# Patient Record
Sex: Female | Born: 1965
Health system: Southern US, Community
[De-identification: ages and names within clinical notes are randomized; demographics above are authoritative.]

## PROBLEM LIST (undated history)

## (undated) DIAGNOSIS — E119 Type 2 diabetes mellitus without complications: Secondary | ICD-10-CM

## (undated) DIAGNOSIS — I739 Peripheral vascular disease, unspecified: Secondary | ICD-10-CM

## (undated) DIAGNOSIS — E039 Hypothyroidism, unspecified: Secondary | ICD-10-CM

## (undated) HISTORY — DX: Peripheral vascular disease, unspecified: I73.9

---

## 2002-09-28 ENCOUNTER — Ambulatory Visit (HOSPITAL_COMMUNITY): Admission: AD | Admit: 2002-09-28 | Discharge: 2002-09-28 | Payer: Self-pay | Admitting: Obstetrics and Gynecology

## 2002-12-10 ENCOUNTER — Encounter: Admission: RE | Admit: 2002-12-10 | Discharge: 2003-03-10 | Payer: Self-pay | Admitting: Obstetrics & Gynecology

## 2003-01-18 ENCOUNTER — Inpatient Hospital Stay (HOSPITAL_COMMUNITY): Admission: AD | Admit: 2003-01-18 | Discharge: 2003-01-21 | Payer: Self-pay | Admitting: Obstetrics and Gynecology

## 2003-01-31 ENCOUNTER — Inpatient Hospital Stay (HOSPITAL_COMMUNITY): Admission: AD | Admit: 2003-01-31 | Discharge: 2003-02-03 | Payer: Self-pay | Admitting: Obstetrics and Gynecology

## 2003-02-08 ENCOUNTER — Encounter: Admission: RE | Admit: 2003-02-08 | Discharge: 2003-02-08 | Payer: Self-pay | Admitting: Oncology

## 2003-03-11 ENCOUNTER — Encounter: Admission: RE | Admit: 2003-03-11 | Discharge: 2003-03-11 | Payer: Self-pay | Admitting: Oncology

## 2003-03-11 ENCOUNTER — Encounter (HOSPITAL_COMMUNITY): Admission: RE | Admit: 2003-03-11 | Discharge: 2003-04-10 | Payer: Self-pay | Admitting: Oncology

## 2003-05-06 ENCOUNTER — Encounter (HOSPITAL_COMMUNITY): Admission: RE | Admit: 2003-05-06 | Discharge: 2003-06-05 | Payer: Self-pay | Admitting: Oncology

## 2003-05-06 ENCOUNTER — Encounter: Admission: RE | Admit: 2003-05-06 | Discharge: 2003-05-06 | Payer: Self-pay | Admitting: Oncology

## 2010-04-17 ENCOUNTER — Encounter: Payer: Self-pay | Admitting: Obstetrics & Gynecology

## 2010-06-09 ENCOUNTER — Other Ambulatory Visit: Payer: Self-pay | Admitting: Obstetrics & Gynecology

## 2010-06-09 DIAGNOSIS — Z139 Encounter for screening, unspecified: Secondary | ICD-10-CM

## 2010-07-10 ENCOUNTER — Ambulatory Visit (HOSPITAL_COMMUNITY)
Admission: RE | Admit: 2010-07-10 | Discharge: 2010-07-10 | Disposition: A | Discharge status: Home / Self Care | Payer: PRIVATE HEALTH INSURANCE | Source: Ambulatory Visit | Attending: Obstetrics & Gynecology | Admitting: Obstetrics & Gynecology

## 2010-07-10 ENCOUNTER — Other Ambulatory Visit (HOSPITAL_COMMUNITY)
Admission: RE | Admit: 2010-07-10 | Discharge: 2010-07-10 | Disposition: A | Discharge status: Home / Self Care | Payer: PRIVATE HEALTH INSURANCE | Source: Ambulatory Visit | Attending: Obstetrics & Gynecology | Admitting: Obstetrics & Gynecology

## 2010-07-10 ENCOUNTER — Other Ambulatory Visit: Payer: Self-pay | Admitting: Obstetrics & Gynecology

## 2010-07-10 DIAGNOSIS — Z139 Encounter for screening, unspecified: Secondary | ICD-10-CM

## 2010-07-10 DIAGNOSIS — Z1231 Encounter for screening mammogram for malignant neoplasm of breast: Secondary | ICD-10-CM | POA: Insufficient documentation

## 2010-07-10 DIAGNOSIS — Z01419 Encounter for gynecological examination (general) (routine) without abnormal findings: Secondary | ICD-10-CM | POA: Insufficient documentation

## 2012-08-06 ENCOUNTER — Other Ambulatory Visit: Payer: Self-pay | Admitting: Obstetrics & Gynecology

## 2012-08-19 ENCOUNTER — Other Ambulatory Visit: Payer: Self-pay

## 2012-09-15 ENCOUNTER — Other Ambulatory Visit: Payer: Self-pay

## 2012-10-08 ENCOUNTER — Telehealth: Payer: Self-pay | Admitting: Obstetrics & Gynecology

## 2012-10-08 MED ORDER — FREESTYLE SYSTEM KIT: 1.0000 | PACK | Status: DC | PRN

## 2012-10-08 NOTE — Telephone Encounter (Signed)
Spoke with pt. Glucometer broke this am and needs another one sent to [facility] in [place]. Thanks!!!

## 2012-10-09 NOTE — Telephone Encounter (Signed)
Freestyle monitor kit sent to [facility] in [place]. Pt aware. JSY

## 2012-10-09 NOTE — Telephone Encounter (Signed)
Message left Glucometer e-scribed to [facility], [place].

## 2012-10-13 ENCOUNTER — Other Ambulatory Visit: Payer: Self-pay

## 2012-11-08 ENCOUNTER — Other Ambulatory Visit: Payer: Self-pay | Admitting: Obstetrics & Gynecology

## 2012-11-10 ENCOUNTER — Other Ambulatory Visit: Payer: Self-pay

## 2012-12-15 ENCOUNTER — Other Ambulatory Visit: Payer: Self-pay

## 2012-12-24 ENCOUNTER — Other Ambulatory Visit: Payer: Self-pay | Admitting: Obstetrics & Gynecology

## 2013-01-12 ENCOUNTER — Other Ambulatory Visit: Payer: Self-pay

## 2013-02-17 ENCOUNTER — Other Ambulatory Visit: Payer: Self-pay

## 2013-03-16 ENCOUNTER — Other Ambulatory Visit: Payer: Self-pay

## 2013-05-11 ENCOUNTER — Other Ambulatory Visit: Payer: Self-pay

## 2013-06-10 ENCOUNTER — Other Ambulatory Visit: Payer: Self-pay | Admitting: Obstetrics & Gynecology

## 2013-07-10 ENCOUNTER — Other Ambulatory Visit: Payer: Self-pay

## 2013-07-30 ENCOUNTER — Other Ambulatory Visit: Payer: Self-pay | Admitting: Obstetrics & Gynecology

## 2014-04-16 ENCOUNTER — Other Ambulatory Visit: Payer: Self-pay | Admitting: Obstetrics & Gynecology

## 2014-04-20 ENCOUNTER — Other Ambulatory Visit: Payer: Self-pay | Admitting: Obstetrics & Gynecology

## 2014-05-18 ENCOUNTER — Other Ambulatory Visit: Payer: Self-pay | Admitting: Obstetrics & Gynecology

## 2014-12-15 ENCOUNTER — Other Ambulatory Visit (HOSPITAL_COMMUNITY): Payer: Self-pay | Admitting: Internal Medicine

## 2014-12-15 DIAGNOSIS — G589 Mononeuropathy, unspecified: Secondary | ICD-10-CM

## 2014-12-17 ENCOUNTER — Ambulatory Visit (HOSPITAL_COMMUNITY)
Admission: RE | Admit: 2014-12-17 | Discharge: 2014-12-17 | Disposition: A | Discharge status: Home / Self Care | Payer: Managed Care, Other (non HMO) | Source: Ambulatory Visit | Attending: Internal Medicine | Admitting: Internal Medicine

## 2014-12-17 DIAGNOSIS — G5791 Unspecified mononeuropathy of right lower limb: Secondary | ICD-10-CM | POA: Insufficient documentation

## 2014-12-17 DIAGNOSIS — Z87891 Personal history of nicotine dependence: Secondary | ICD-10-CM | POA: Insufficient documentation

## 2014-12-17 DIAGNOSIS — E1141 Type 2 diabetes mellitus with diabetic mononeuropathy: Secondary | ICD-10-CM | POA: Insufficient documentation

## 2014-12-17 DIAGNOSIS — G589 Mononeuropathy, unspecified: Secondary | ICD-10-CM

## 2014-12-30 ENCOUNTER — Other Ambulatory Visit: Payer: Self-pay | Admitting: *Deleted

## 2014-12-30 DIAGNOSIS — I739 Peripheral vascular disease, unspecified: Secondary | ICD-10-CM

## 2015-01-24 ENCOUNTER — Other Ambulatory Visit: Payer: Self-pay

## 2015-01-24 DIAGNOSIS — I75021 Atheroembolism of right lower extremity: Secondary | ICD-10-CM

## 2015-01-25 ENCOUNTER — Ambulatory Visit (INDEPENDENT_AMBULATORY_CARE_PROVIDER_SITE_OTHER): Payer: Managed Care, Other (non HMO) | Admitting: Vascular Surgery

## 2015-01-25 ENCOUNTER — Encounter: Payer: Self-pay | Admitting: *Deleted

## 2015-01-25 ENCOUNTER — Other Ambulatory Visit: Payer: Self-pay

## 2015-01-25 ENCOUNTER — Ambulatory Visit (INDEPENDENT_AMBULATORY_CARE_PROVIDER_SITE_OTHER)
Admission: RE | Admit: 2015-01-25 | Discharge: 2015-01-25 | Disposition: A | Discharge status: Home / Self Care | Payer: Managed Care, Other (non HMO) | Source: Ambulatory Visit | Attending: Vascular Surgery | Admitting: Vascular Surgery

## 2015-01-25 ENCOUNTER — Encounter: Payer: Self-pay | Admitting: Vascular Surgery

## 2015-01-25 VITALS — BP 134/81 | HR 81 | Wt 155.0 lb

## 2015-01-25 DIAGNOSIS — I739 Peripheral vascular disease, unspecified: Secondary | ICD-10-CM

## 2015-01-25 NOTE — Progress Notes (Signed)
Patient name: Brianna Powers MRN: [id number] DOB: 04/13/1965 Sex: female   Referred by: Charlestine Night  Reason for referral:  Chief Complaint  Patient presents with  . Re-evaluation    add on - eval blue toe syndrome     HISTORY OF PRESENT ILLNESS: Patient resents today for a discussion of her right foot. She is an otherwise healthy 49 year old female with a several month history of difficulty in her right foot. Been diagnosed with Buerger's and with rain onset dominant. Initially she reports that this would come and go but she has had consistent pain in her right foot with cyanotic and mottled changing. This is become progressively worse and now her right toe and the area of the ball of her right great toe remains blue and painful. This is worse with cold exposure. Difficult for her to walk. This is all new for her. She does not have any similar symptoms in her hands or in her left foot. She does not have any ulcerations.  No past medical history on file.  No past surgical history on file.  Social History   Social History  . Marital Status: Married    Spouse Name: N/A  . Number of Children: N/A  . Years of Education: N/A   Occupational History  . Not on file.   Social History Main Topics  . Smoking status: Former Smoker    Quit date: 05/25/2014  . Smokeless tobacco: Not on file  . Alcohol Use: No  . Drug Use: No  . Sexual Activity: Not on file   Other Topics Concern  . Not on file   Social History Narrative  . No narrative on file    No family history on file.  Allergies as of 01/25/2015  . (No Known Allergies)    Current Outpatient Prescriptions on File Prior to Visit  Medication Sig Dispense Refill  . ACCU-CHEK AVIVA PLUS test strip TEST twice a day 50 each PRN  . ACCU-CHEK SOFTCLIX LANCETS lancets TEST twice a day 100 each PRN  . glucose monitoring kit (FREESTYLE) monitoring kit 1 each by Does not apply route as needed for other. Use as directed 1 each 0    . SYNTHROID 100 MCG tablet take 1 tablet by mouth once daily 30 tablet 1   No current facility-administered medications on file prior to visit.     REVIEW OF SYSTEMS:  Positives indicated with an "X"  CARDIOVASCULAR:  _0  chest pain   _1  chest pressure   _2  palpitations   _3  orthopnea   _4  dyspnea on exertion   _5  claudication   [x ] rest pain   _6  DVT   _7  phlebitis PULMONARY:   _8  productive cough   _9  asthma   _10  wheezing NEUROLOGIC:   _11  weakness  _12  paresthesias  _13  aphasia  _14  amaurosis  _15  dizziness HEMATOLOGIC:   _16  bleeding problems   _17  clotting disorders MUSCULOSKELETAL:  _18  joint pain   _19  joint swelling GASTROINTESTINAL: _20   blood in stool  _21   hematemesis GENITOURINARY:  _22   dysuria  _23   hematuria PSYCHIATRIC:  _24  history of major depression INTEGUMENTARY:  _25  rashes  _26  ulcers CONSTITUTIONAL:  _27  fever   _28  chills  PHYSICAL EXAMINATION:  General: The patient is a well-nourished female, in no acute distress. Vital  signs are BP 134/81 mmHg  Pulse 81  Wt 155 lb (70.308 kg)  SpO2 97% Pulmonary: There is a good air exchange bilaterally without wheezing or rales. Abdomen: Soft and non-tender with normal pitch bowel sounds. No bruits noted Musculoskeletal: There are no major deformities.  There is no significant extremity pain. Neurologic: No focal weakness or paresthesias are detected, Skin: There are no ulcer or rashes noted. Cyanosis in her right great toe and dorsum and plantar aspect of her foot Psychiatric: The patient has normal affect. Cardiovascular: There is a regular rate and rhythm without significant murmur appreciated. 1+ femoral pulses bilaterally. 1+ left dorsalis pedis pulse and absent pedal pulses on the right  [facility] Vascular Lab Studies:  Ordered and Independently Reviewed markedly dampened flow in the digits of all the toes on her right foot. She has monophasic flow in the posterior tibial and anterior tibial right.  Biphasic flow on the left. Ankle arm indexes 0.77 on the right and 0.90 on the left  Impression and Plan:  Had long discussion with patient. Unclear as to etiology of the ischemia of the toes on her right foot. This could be embolic. Doubtful that this is Raynaud's with only her right foot and no hand involvement. Have recommended arteriography for further evaluation. She wished to proceed as soon as possible be added to her scheduled for tomorrow. Will make further recommendations depending on her study. Explain that there is a very high likelihood that there will be nothing correctable regarding her foot ischemia.    Curt Jews [facility] [place] Office: [phone]

## 2015-01-26 ENCOUNTER — Encounter (HOSPITAL_COMMUNITY): Payer: Self-pay

## 2015-01-26 ENCOUNTER — Inpatient Hospital Stay (HOSPITAL_COMMUNITY)
Admission: AD | Admit: 2015-01-26 | Discharge: 2015-01-30 | DRG: 254 | Disposition: A | Discharge status: Home / Self Care | Payer: Managed Care, Other (non HMO) | Source: Ambulatory Visit | Attending: Vascular Surgery | Admitting: Vascular Surgery

## 2015-01-26 ENCOUNTER — Encounter (HOSPITAL_COMMUNITY)
Admission: AD | Disposition: A | Discharge status: Home / Self Care | Payer: Self-pay | Source: Ambulatory Visit | Attending: Vascular Surgery

## 2015-01-26 DIAGNOSIS — I70203 Unspecified atherosclerosis of native arteries of extremities, bilateral legs: Principal | ICD-10-CM | POA: Diagnosis present

## 2015-01-26 DIAGNOSIS — Z9889 Other specified postprocedural states: Secondary | ICD-10-CM

## 2015-01-26 DIAGNOSIS — E039 Hypothyroidism, unspecified: Secondary | ICD-10-CM | POA: Diagnosis present

## 2015-01-26 DIAGNOSIS — I731 Thromboangiitis obliterans [Buerger's disease]: Secondary | ICD-10-CM | POA: Diagnosis present

## 2015-01-26 DIAGNOSIS — I998 Other disorder of circulatory system: Secondary | ICD-10-CM | POA: Diagnosis present

## 2015-01-26 DIAGNOSIS — Z01811 Encounter for preprocedural respiratory examination: Secondary | ICD-10-CM

## 2015-01-26 DIAGNOSIS — R230 Cyanosis: Secondary | ICD-10-CM | POA: Diagnosis present

## 2015-01-26 DIAGNOSIS — I743 Embolism and thrombosis of arteries of the lower extremities: Secondary | ICD-10-CM | POA: Diagnosis not present

## 2015-01-26 DIAGNOSIS — E119 Type 2 diabetes mellitus without complications: Secondary | ICD-10-CM | POA: Diagnosis present

## 2015-01-26 DIAGNOSIS — I7410 Embolism and thrombosis of unspecified parts of aorta: Secondary | ICD-10-CM | POA: Diagnosis present

## 2015-01-26 HISTORY — PX: PERIPHERAL VASCULAR CATHETERIZATION: SHX172C

## 2015-01-26 HISTORY — DX: Type 2 diabetes mellitus without complications: E11.9

## 2015-01-26 HISTORY — DX: Hypothyroidism, unspecified: E03.9

## 2015-01-26 LAB — URINALYSIS, ROUTINE W REFLEX MICROSCOPIC
Bilirubin Urine: NEGATIVE
Glucose, UA: NEGATIVE mg/dL
KETONES UR: 15 mg/dL — AB
LEUKOCYTES UA: NEGATIVE
NITRITE: NEGATIVE
PH: 6 (ref 5.0–8.0)
PROTEIN: NEGATIVE mg/dL
Specific Gravity, Urine: 1.025 (ref 1.005–1.030)
Urobilinogen, UA: 0.2 mg/dL (ref 0.0–1.0)

## 2015-01-26 LAB — POCT I-STAT, CHEM 8
BUN: 16 mg/dL (ref 6–20)
CHLORIDE: 104 mmol/L (ref 101–111)
CREATININE: 0.8 mg/dL (ref 0.44–1.00)
Calcium, Ion: 1.15 mmol/L (ref 1.12–1.23)
GLUCOSE: 103 mg/dL — AB (ref 65–99)
HCT: 44 % (ref 36.0–46.0)
Hemoglobin: 15 g/dL (ref 12.0–15.0)
POTASSIUM: 3.8 mmol/L (ref 3.5–5.1)
Sodium: 139 mmol/L (ref 135–145)
TCO2: 22 mmol/L (ref 0–100)

## 2015-01-26 LAB — URINE MICROSCOPIC-ADD ON

## 2015-01-26 LAB — COMPREHENSIVE METABOLIC PANEL
ALT: 16 U/L (ref 14–54)
AST: 20 U/L (ref 15–41)
Albumin: 3.6 g/dL (ref 3.5–5.0)
Alkaline Phosphatase: 84 U/L (ref 38–126)
Anion gap: 11 (ref 5–15)
BILIRUBIN TOTAL: 0.6 mg/dL (ref 0.3–1.2)
BUN: 11 mg/dL (ref 6–20)
CO2: 27 mmol/L (ref 22–32)
Calcium: 9.5 mg/dL (ref 8.9–10.3)
Chloride: 103 mmol/L (ref 101–111)
Creatinine, Ser: 0.81 mg/dL (ref 0.44–1.00)
Glucose, Bld: 112 mg/dL — ABNORMAL HIGH (ref 65–99)
POTASSIUM: 3.9 mmol/L (ref 3.5–5.1)
Sodium: 141 mmol/L (ref 135–145)
TOTAL PROTEIN: 6.7 g/dL (ref 6.5–8.1)

## 2015-01-26 LAB — CBC
HEMATOCRIT: 39.6 % (ref 36.0–46.0)
Hemoglobin: 12.9 g/dL (ref 12.0–15.0)
MCH: 30.6 pg (ref 26.0–34.0)
MCHC: 32.6 g/dL (ref 30.0–36.0)
MCV: 93.8 fL (ref 78.0–100.0)
PLATELETS: 288 10*3/uL (ref 150–400)
RBC: 4.22 MIL/uL (ref 3.87–5.11)
RDW: 12.9 % (ref 11.5–15.5)
WBC: 11.9 10*3/uL — ABNORMAL HIGH (ref 4.0–10.5)

## 2015-01-26 LAB — GLUCOSE, CAPILLARY
Glucose-Capillary: 96 mg/dL (ref 65–99)
Glucose-Capillary: 92 mg/dL (ref 65–99)
Glucose-Capillary: 121 mg/dL — ABNORMAL HIGH (ref 65–99)

## 2015-01-26 LAB — PROTIME-INR
INR: 1.06 (ref 0.00–1.49)
PROTHROMBIN TIME: 14 s (ref 11.6–15.2)

## 2015-01-26 SURGERY — ABDOMINAL AORTOGRAM
Anesthesia: LOCAL

## 2015-01-26 MED ORDER — MIDAZOLAM HCL 2 MG/2ML IJ SOLN
INTRAMUSCULAR | Status: AC
  Filled 2015-01-26: qty 4

## 2015-01-26 MED ORDER — SODIUM CHLORIDE 0.9 % IV SOLN
INTRAVENOUS | Status: DC
  Administered 2015-01-26 – 2015-01-28 (×3): via INTRAVENOUS

## 2015-01-26 MED ORDER — ACETAMINOPHEN 325 MG RE SUPP
325.0000 mg | RECTAL | Status: DC | PRN
  Filled 2015-01-26: qty 2

## 2015-01-26 MED ORDER — BISACODYL 10 MG RE SUPP: 10.0000 mg | Freq: Every day | RECTAL | Status: DC | PRN

## 2015-01-26 MED ORDER — ACETAMINOPHEN 325 MG PO TABS: 325.0000 mg | ORAL_TABLET | ORAL | Status: DC | PRN

## 2015-01-26 MED ORDER — FENTANYL CITRATE (PF) 100 MCG/2ML IJ SOLN
INTRAMUSCULAR | Status: AC
  Filled 2015-01-26: qty 4

## 2015-01-26 MED ORDER — HYDRALAZINE HCL 20 MG/ML IJ SOLN: 5.0000 mg | INTRAMUSCULAR | Status: DC | PRN

## 2015-01-26 MED ORDER — MIDAZOLAM HCL 2 MG/2ML IJ SOLN
INTRAMUSCULAR | Status: DC | PRN
  Administered 2015-01-26: 1 mg via INTRAVENOUS

## 2015-01-26 MED ORDER — GABAPENTIN 100 MG PO CAPS: 100.0000 mg | ORAL_CAPSULE | Freq: Two times a day (BID) | ORAL | Status: DC | PRN

## 2015-01-26 MED ORDER — INSULIN ASPART 100 UNIT/ML ~~LOC~~ SOLN: 0.0000 [IU] | Freq: Three times a day (TID) | SUBCUTANEOUS | Status: DC

## 2015-01-26 MED ORDER — SODIUM CHLORIDE 0.9 % IV SOLN
INTRAVENOUS | Status: DC
  Administered 2015-01-26: 12:00:00 via INTRAVENOUS

## 2015-01-26 MED ORDER — PANTOPRAZOLE SODIUM 40 MG PO TBEC
40.0000 mg | DELAYED_RELEASE_TABLET | Freq: Every day | ORAL | Status: DC
  Administered 2015-01-29: 40 mg via ORAL
  Filled 2015-01-26 (×3): qty 1

## 2015-01-26 MED ORDER — LEVOTHYROXINE SODIUM 100 MCG PO TABS
100.0000 ug | ORAL_TABLET | Freq: Every day | ORAL | Status: DC
  Administered 2015-01-27 – 2015-01-30 (×3): 100 ug via ORAL
  Filled 2015-01-26 (×5): qty 1

## 2015-01-26 MED ORDER — GUAIFENESIN-DM 100-10 MG/5ML PO SYRP: 15.0000 mL | ORAL_SOLUTION | ORAL | Status: DC | PRN

## 2015-01-26 MED ORDER — FENTANYL CITRATE (PF) 100 MCG/2ML IJ SOLN
INTRAMUSCULAR | Status: DC | PRN
  Administered 2015-01-26: 25 ug via INTRAVENOUS

## 2015-01-26 MED ORDER — HYDROCODONE-ACETAMINOPHEN 5-325 MG PO TABS: 1.0000 | ORAL_TABLET | Freq: Four times a day (QID) | ORAL | Status: DC | PRN

## 2015-01-26 MED ORDER — METOPROLOL TARTRATE 1 MG/ML IV SOLN: 2.0000 mg | INTRAVENOUS | Status: DC | PRN

## 2015-01-26 MED ORDER — PHENOL 1.4 % MT LIQD: 1.0000 | OROMUCOSAL | Status: DC | PRN

## 2015-01-26 MED ORDER — ENOXAPARIN SODIUM 40 MG/0.4ML ~~LOC~~ SOLN
40.0000 mg | SUBCUTANEOUS | Status: DC
  Administered 2015-01-29 – 2015-01-30 (×2): 40 mg via SUBCUTANEOUS
  Filled 2015-01-26 (×3): qty 0.4

## 2015-01-26 MED ORDER — LIDOCAINE HCL (PF) 1 % IJ SOLN
INTRAMUSCULAR | Status: AC
  Filled 2015-01-26: qty 30

## 2015-01-26 MED ORDER — TRAMADOL HCL 50 MG PO TABS
50.0000 mg | ORAL_TABLET | Freq: Four times a day (QID) | ORAL | Status: DC | PRN
Start: 2015-01-26 — End: 2015-01-30
  Administered 2015-01-28 – 2015-01-30 (×4): 50 mg via ORAL
  Filled 2015-01-26 (×4): qty 1

## 2015-01-26 MED ORDER — LIDOCAINE HCL (PF) 1 % IJ SOLN
INTRAMUSCULAR | Status: DC | PRN
  Administered 2015-01-26: 15:00:00

## 2015-01-26 MED ORDER — IODIXANOL 320 MG/ML IV SOLN
INTRAVENOUS | Status: DC | PRN
  Administered 2015-01-26: 100 mL via INTRA_ARTERIAL

## 2015-01-26 MED ORDER — MAGNESIUM HYDROXIDE 400 MG/5ML PO SUSP: 30.0000 mL | Freq: Every day | ORAL | Status: DC | PRN

## 2015-01-26 MED ORDER — ONDANSETRON HCL 4 MG/2ML IJ SOLN: 4.0000 mg | Freq: Four times a day (QID) | INTRAMUSCULAR | Status: DC | PRN

## 2015-01-26 MED ORDER — MORPHINE SULFATE (PF) 4 MG/ML IV SOLN
INTRAVENOUS | Status: AC
  Filled 2015-01-26: qty 1

## 2015-01-26 MED ORDER — HEPARIN (PORCINE) IN NACL 2-0.9 UNIT/ML-% IJ SOLN
INTRAMUSCULAR | Status: AC
  Filled 2015-01-26: qty 1000

## 2015-01-26 MED ORDER — LABETALOL HCL 5 MG/ML IV SOLN: 10.0000 mg | INTRAVENOUS | Status: DC | PRN

## 2015-01-26 MED ORDER — POTASSIUM CHLORIDE CRYS ER 20 MEQ PO TBCR: 20.0000 meq | EXTENDED_RELEASE_TABLET | Freq: Once | ORAL | Status: DC

## 2015-01-26 MED ORDER — MORPHINE SULFATE (PF) 2 MG/ML IV SOLN
4.0000 mg | INTRAVENOUS | Status: DC | PRN
Start: 2015-01-26 — End: 2015-01-30
  Administered 2015-01-26: 4 mg via INTRAVENOUS
  Filled 2015-01-26: qty 2

## 2015-01-26 MED ORDER — MORPHINE SULFATE (PF) 2 MG/ML IV SOLN
2.0000 mg | INTRAVENOUS | Status: DC | PRN
  Administered 2015-01-27 (×2): 2 mg via INTRAVENOUS
  Administered 2015-01-27: 4 mg via INTRAVENOUS
  Filled 2015-01-26: qty 1
  Filled 2015-01-26: qty 2
  Filled 2015-01-26: qty 1

## 2015-01-26 MED ORDER — ALUM & MAG HYDROXIDE-SIMETH 200-200-20 MG/5ML PO SUSP: 15.0000 mL | ORAL | Status: DC | PRN

## 2015-01-26 SURGICAL SUPPLY — 12 items
CATH ANGIO 5F PIGTAIL 65CM (CATHETERS) ×2 IMPLANT
CATH SOFT-VU 4F 65 STRAIGHT (CATHETERS) ×1 IMPLANT
CATH SOFT-VU STRAIGHT 4F 65CM (CATHETERS) ×1
COVER PRB 48X5XTLSCP FOLD TPE (BAG) ×1 IMPLANT
COVER PROBE 5X48 (BAG) ×1
IV CATH ACCUCATH 20GX1.25IN KT (NEEDLE) ×4 IMPLANT
KIT PV (KITS) ×2 IMPLANT
SHEATH PINNACLE 5F 10CM (SHEATH) ×2 IMPLANT
SYR MEDRAD MARK V 150ML (SYRINGE) ×2 IMPLANT
TRANSDUCER W/STOPCOCK (MISCELLANEOUS) ×2 IMPLANT
TRAY PV CATH (CUSTOM PROCEDURE TRAY) ×2 IMPLANT
WIRE HITORQ VERSACORE ST 145CM (WIRE) ×2 IMPLANT

## 2015-01-26 NOTE — Progress Notes (Signed)
Site area: [facility] visitorfem art Site Prior to Removal:  Level 0 Pressure Applied For:20 min Manual:   manual Patient Status During Pull:  A/o Post Pull Site:  Level 0 Post Pull Instructions Given:  Pt understands post instructions Post Pull Pulses Present: doppler rt pt// doppler lt pt/dp Dressing Applied:  tegaderm and a 4x4 Bedrest begins @ 16:15:00 Comments:

## 2015-01-27 ENCOUNTER — Encounter (HOSPITAL_COMMUNITY): Payer: Self-pay | Admitting: Radiology

## 2015-01-27 ENCOUNTER — Ambulatory Visit (HOSPITAL_COMMUNITY): Payer: Managed Care, Other (non HMO)

## 2015-01-27 LAB — HEMOGLOBIN A1C
HEMOGLOBIN A1C: 6.2 % — AB (ref 4.8–5.6)
MEAN PLASMA GLUCOSE: 131 mg/dL

## 2015-01-27 LAB — GLUCOSE, CAPILLARY
GLUCOSE-CAPILLARY: 102 mg/dL — AB (ref 65–99)
GLUCOSE-CAPILLARY: 89 mg/dL (ref 65–99)
Glucose-Capillary: 129 mg/dL — ABNORMAL HIGH (ref 65–99)
Glucose-Capillary: 106 mg/dL — ABNORMAL HIGH (ref 65–99)

## 2015-01-27 LAB — CBC
HCT: 37.4 % (ref 36.0–46.0)
Hemoglobin: 12 g/dL (ref 12.0–15.0)
MCH: 30.3 pg (ref 26.0–34.0)
MCHC: 32.1 g/dL (ref 30.0–36.0)
MCV: 94.4 fL (ref 78.0–100.0)
PLATELETS: 269 10*3/uL (ref 150–400)
RBC: 3.96 MIL/uL (ref 3.87–5.11)
RDW: 12.8 % (ref 11.5–15.5)
WBC: 10.8 10*3/uL — AB (ref 4.0–10.5)

## 2015-01-27 LAB — BASIC METABOLIC PANEL
ANION GAP: 8 (ref 5–15)
BUN: 15 mg/dL (ref 6–20)
CALCIUM: 9.1 mg/dL (ref 8.9–10.3)
CO2: 23 mmol/L (ref 22–32)
Chloride: 106 mmol/L (ref 101–111)
Creatinine, Ser: 0.79 mg/dL (ref 0.44–1.00)
Glucose, Bld: 112 mg/dL — ABNORMAL HIGH (ref 65–99)
Potassium: 4.2 mmol/L (ref 3.5–5.1)
Sodium: 137 mmol/L (ref 135–145)

## 2015-01-27 MED ORDER — DEXTROSE 5 % IV SOLN
1.5000 g | INTRAVENOUS | Status: AC
  Administered 2015-01-28: 1.5 g via INTRAVENOUS
  Filled 2015-01-27: qty 1.5

## 2015-01-27 MED ORDER — IOHEXOL 350 MG/ML SOLN
100.0000 mL | Freq: Once | INTRAVENOUS | Status: AC | PRN
  Administered 2015-01-27: 100 mL via INTRAVENOUS

## 2015-01-27 NOTE — Progress Notes (Signed)
Utilization review completed. Ihor Meinzer, RN, BSN. 

## 2015-01-27 NOTE — Progress Notes (Signed)
Subjective: Interval History: none.. Still with pain in her right foot but better controlled with morphine IV.  Objective: Vital signs in last 24 hours: Temp:  [97.7 F (36.5 C)-98.6 F (37 C)] 98.6 F (37 C) (11/03 0406) Pulse Rate:  [69-101] 72 (11/03 0406) Resp:  [10-21] 16 (11/03 0406) BP: (101-139)/(53-93) 101/59 mmHg (11/03 0406) SpO2:  [97 %-100 %] 100 % (11/03 0406) Weight:  [155 lb (70.308 kg)] 155 lb (70.308 kg) (11/02 1123)  Intake/Output from previous day: 11/02 0701 - 11/03 0700 In: 240 [P.O.:240] Out: 600 [Urine:600] Intake/Output this shift:    No change in her right foot. Cyanotic right great toe and metatarsal head area. Left groin without hematoma  Lab Results:  Recent Labs  01/26/15 1931 01/27/15 0338  WBC 11.9* 10.8*  HGB 12.9 12.0  HCT 39.6 37.4  PLT 288 269   BMET  Recent Labs  01/26/15 1931 01/27/15 0338  NA 141 137  K 3.9 4.2  CL 103 106  CO2 27 23  GLUCOSE 112* 112*  BUN 11 15  CREATININE 0.81 0.79  CALCIUM 9.5 9.1    Studies/Results: Dg Chest 2 View  01/27/2015  CLINICAL DATA:  Abdominal surgery. EXAM: CHEST  2 VIEW COMPARISON:  01/23/2011. FINDINGS: Mediastinum and hilar structures normal. The lungs are clear. Heart size normal. No pleural effusion or pneumothorax. No acute bony abnormality. IMPRESSION: No acute cardiopulmonary disease. Electronically Signed   By: Maisie Fus  Register   On: 01/27/2015 07:14   Ct Angio Chest Aorta W/cm &/or Wo/cm  01/27/2015  CLINICAL DATA:  Evaluate source of embolism EXAM: CT ANGIOGRAPHY CHEST, ABDOMEN AND PELVIS TECHNIQUE: Multidetector CT imaging through the chest, abdomen and pelvis was performed using the standard protocol during bolus administration of intravenous contrast. Multiplanar reconstructed images and MIPs were obtained and reviewed to evaluate the vascular anatomy. CONTRAST:  OMNIPAQUE IOHEXOL 350 MG/ML SOLN COMPARISON:  None. FINDINGS: CTA CHEST FINDINGS The thoracic aorta is normal  in caliber. There is no dissection. There is no stenosis. There is no significant plaque. The great vessel origins are widely patent with standard anatomy. Pulmonary vasculature is well opacified and patent with no pulmonary embolism. Review of the MIP images confirms the above findings. Nonvascular chest findings: The lungs are clear. Central airways are patent. There is no effusion. There is no adenopathy. No significant musculoskeletal abnormalities are evident. CTA ABDOMEN AND PELVIS FINDINGS The upper abdominal aorta is normal in caliber and widely patent. The celiac axis, superior mesenteric artery, inferior mesenteric artery, and both renal arteries are widely patent. In the infrarenal aorta, there is an ulcerated plaque anteriorly, seen to best advantage on axial image 141 series 4. Just above the aortic bifurcation there is a significant stenosis with greater than 70% diameter reduction. The AP diameter of the aorta is reduced to 3 mm 1-1.5 cm above the bifurcation. Both common iliac arteries are normal in caliber at their origins and are widely patent. Iliac bifurcations are widely patent. External iliac arteries and common femoral arteries are widely patent bilaterally. Femoral bifurcations are widely patent bilaterally. Review of the MIP images confirms the above findings. Nonvascular findings in the abdomen and pelvis: There are normal appearances of the liver, spleen, pancreas, adrenals, gallbladder and bile ducts. Both kidneys are normal. Uterus and adnexal structures appear unremarkable. Bowel is unremarkable. No significant musculoskeletal abnormalities. IMPRESSION: 1. No significant abnormality in the chest. 2. Ulcerated plaque at the anterior aspect of the infrarenal abdominal aorta, 4-5 cm above the bifurcation.  3. 70% stenosis of the distal abdominal aorta 1-1.5 cm above the bifurcation. Electronically Signed   By: Ellery Plunkaniel R Mitchell M.D.   On: 01/27/2015 06:04   Ct Angio Abd/pel W/ And/or  W/o  01/27/2015  CLINICAL DATA:  Evaluate source of embolism EXAM: CT ANGIOGRAPHY CHEST, ABDOMEN AND PELVIS TECHNIQUE: Multidetector CT imaging through the chest, abdomen and pelvis was performed using the standard protocol during bolus administration of intravenous contrast. Multiplanar reconstructed images and MIPs were obtained and reviewed to evaluate the vascular anatomy. CONTRAST:  100mL OMNIPAQUE IOHEXOL 350 MG/ML SOLN COMPARISON:  None. FINDINGS: CTA CHEST FINDINGS The thoracic aorta is normal in caliber. There is no dissection. There is no stenosis. There is no significant plaque. The great vessel origins are widely patent with standard anatomy. Pulmonary vasculature is well opacified and patent with no pulmonary embolism. Review of the MIP images confirms the above findings. Nonvascular chest findings: The lungs are clear. Central airways are patent. There is no effusion. There is no adenopathy. No significant musculoskeletal abnormalities are evident. CTA ABDOMEN AND PELVIS FINDINGS The upper abdominal aorta is normal in caliber and widely patent. The celiac axis, superior mesenteric artery, inferior mesenteric artery, and both renal arteries are widely patent. In the infrarenal aorta, there is an ulcerated plaque anteriorly, seen to best advantage on axial image 141 series 4. Just above the aortic bifurcation there is a significant stenosis with greater than 70% diameter reduction. The AP diameter of the aorta is reduced to 3 mm 1-1.5 cm above the bifurcation. Both common iliac arteries are normal in caliber at their origins and are widely patent. Iliac bifurcations are widely patent. External iliac arteries and common femoral arteries are widely patent bilaterally. Femoral bifurcations are widely patent bilaterally. Review of the MIP images confirms the above findings. Nonvascular findings in the abdomen and pelvis: There are normal appearances of the liver, spleen, pancreas, adrenals, gallbladder and  bile ducts. Both kidneys are normal. Uterus and adnexal structures appear unremarkable. Bowel is unremarkable. No significant musculoskeletal abnormalities. IMPRESSION: 1. No significant abnormality in the chest. 2. Ulcerated plaque at the anterior aspect of the infrarenal abdominal aorta, 4-5 cm above the bifurcation. 3. 70% stenosis of the distal abdominal aorta 1-1.5 cm above the bifurcation. Electronically Signed   By: Ellery Plunkaniel R Mitchell M.D.   On: 01/27/2015 06:04   Anti-infectives: Anti-infectives    None      Assessment/Plan: s/p Procedure(s): Abdominal Aortogram (N/A) Reviewed CT angiogram of her chest abdomen. This shows no evidence of source for embolus from her chest. She does have ulcerative plaque proximal 45 cm above her aortic bifurcation and high-grade stenosis with either plaque or thrombus at her aortic bifurcation. Will obtain 2-D echo to rule out cardiogenic source for embolus. Plan bilateral groin exploration and attempted thrombectomy versus angioplasty of her aortic bifurcation in the operating room tomorrow.   LOS: 1 day   Brianna Powers, Brianna Powers 01/27/2015, 9:25 AM

## 2015-01-27 NOTE — Progress Notes (Signed)
  Echocardiogram 2D Echocardiogram has been performed.  Arvil ChacoFoster, Kayse Puccini 01/27/2015, 10:49 AM

## 2015-01-28 ENCOUNTER — Encounter (HOSPITAL_COMMUNITY): Payer: Self-pay | Admitting: Anesthesiology

## 2015-01-28 ENCOUNTER — Inpatient Hospital Stay (HOSPITAL_COMMUNITY): Payer: Managed Care, Other (non HMO) | Admitting: Anesthesiology

## 2015-01-28 ENCOUNTER — Encounter (HOSPITAL_COMMUNITY)
Admission: AD | Disposition: A | Discharge status: Home / Self Care | Payer: Self-pay | Source: Ambulatory Visit | Attending: Vascular Surgery

## 2015-01-28 DIAGNOSIS — I743 Embolism and thrombosis of arteries of the lower extremities: Secondary | ICD-10-CM

## 2015-01-28 HISTORY — PX: FEMORAL ARTERY EXPLORATION: SHX5160

## 2015-01-28 HISTORY — PX: INSERTION OF ILIAC STENT: SHX6256

## 2015-01-28 LAB — BASIC METABOLIC PANEL
Anion gap: 8 (ref 5–15)
BUN: 11 mg/dL (ref 6–20)
CO2: 26 mmol/L (ref 22–32)
CREATININE: 0.86 mg/dL (ref 0.44–1.00)
Calcium: 9 mg/dL (ref 8.9–10.3)
Chloride: 107 mmol/L (ref 101–111)
GFR calc Af Amer: >60 mL/min (ref >60)
GLUCOSE: 108 mg/dL — AB (ref 65–99)
POTASSIUM: 4.1 mmol/L (ref 3.5–5.1)
SODIUM: 141 mmol/L (ref 135–145)

## 2015-01-28 LAB — SURGICAL PCR SCREEN
MRSA, PCR: NEGATIVE
Staphylococcus aureus: NEGATIVE

## 2015-01-28 LAB — CBC
HCT: 37 % (ref 36.0–46.0)
Hemoglobin: 12 g/dL (ref 12.0–15.0)
MCH: 31 pg (ref 26.0–34.0)
MCHC: 32.4 g/dL (ref 30.0–36.0)
MCV: 95.6 fL (ref 78.0–100.0)
Platelets: 266 10*3/uL (ref 150–400)
RBC: 3.87 MIL/uL (ref 3.87–5.11)
RDW: 13 % (ref 11.5–15.5)
WBC: 10.3 10*3/uL (ref 4.0–10.5)

## 2015-01-28 LAB — GLUCOSE, CAPILLARY
GLUCOSE-CAPILLARY: 101 mg/dL — AB (ref 65–99)
GLUCOSE-CAPILLARY: 131 mg/dL — AB (ref 65–99)
Glucose-Capillary: 108 mg/dL — ABNORMAL HIGH (ref 65–99)
Glucose-Capillary: 105 mg/dL — ABNORMAL HIGH (ref 65–99)
Glucose-Capillary: 100 mg/dL — ABNORMAL HIGH (ref 65–99)

## 2015-01-28 SURGERY — INSERTION, STENT, ARTERY, ILIAC
Anesthesia: General | Site: Groin | Laterality: Bilateral

## 2015-01-28 MED ORDER — MIDAZOLAM HCL 2 MG/2ML IJ SOLN
INTRAMUSCULAR | Status: AC
  Filled 2015-01-28: qty 4

## 2015-01-28 MED ORDER — GLYCOPYRROLATE 0.2 MG/ML IJ SOLN
INTRAMUSCULAR | Status: DC | PRN
  Administered 2015-01-28: .4 mg via INTRAVENOUS

## 2015-01-28 MED ORDER — ONDANSETRON HCL 4 MG/2ML IJ SOLN
INTRAMUSCULAR | Status: AC
  Filled 2015-01-28: qty 2

## 2015-01-28 MED ORDER — HEPARIN SODIUM (PORCINE) 1000 UNIT/ML IJ SOLN
INTRAMUSCULAR | Status: DC | PRN
  Administered 2015-01-28: 7000 [IU] via INTRAVENOUS

## 2015-01-28 MED ORDER — DEXAMETHASONE SODIUM PHOSPHATE 4 MG/ML IJ SOLN
INTRAMUSCULAR | Status: DC | PRN
  Administered 2015-01-28: 4 mg via INTRAVENOUS

## 2015-01-28 MED ORDER — CLOPIDOGREL BISULFATE 75 MG PO TABS
75.0000 mg | ORAL_TABLET | Freq: Every day | ORAL | Status: DC
  Administered 2015-01-28 – 2015-01-30 (×3): 75 mg via ORAL
  Filled 2015-01-28 (×3): qty 1

## 2015-01-28 MED ORDER — LACTATED RINGERS IV SOLN
INTRAVENOUS | Status: DC
  Administered 2015-01-28 (×2): via INTRAVENOUS

## 2015-01-28 MED ORDER — DEXTROSE 5 % IV SOLN
1.5000 g | Freq: Two times a day (BID) | INTRAVENOUS | Status: AC
  Administered 2015-01-28 – 2015-01-29 (×2): 1.5 g via INTRAVENOUS
  Filled 2015-01-28 (×2): qty 1.5

## 2015-01-28 MED ORDER — ONDANSETRON HCL 4 MG/2ML IJ SOLN: 4.0000 mg | Freq: Once | INTRAMUSCULAR | Status: DC | PRN

## 2015-01-28 MED ORDER — DOCUSATE SODIUM 100 MG PO CAPS
100.0000 mg | ORAL_CAPSULE | Freq: Every day | ORAL | Status: DC
  Administered 2015-01-29 – 2015-01-30 (×2): 100 mg via ORAL
  Filled 2015-01-28 (×2): qty 1

## 2015-01-28 MED ORDER — LIDOCAINE HCL (CARDIAC) 20 MG/ML IV SOLN
INTRAVENOUS | Status: DC | PRN
  Administered 2015-01-28: 20 mg via INTRAVENOUS

## 2015-01-28 MED ORDER — SODIUM CHLORIDE 0.9 % IV SOLN
INTRAVENOUS | Status: DC | PRN
  Administered 2015-01-28 (×2): 500 mL

## 2015-01-28 MED ORDER — 0.9 % SODIUM CHLORIDE (POUR BTL) OPTIME
TOPICAL | Status: DC | PRN
  Administered 2015-01-28: 2000 mL

## 2015-01-28 MED ORDER — PROTAMINE SULFATE 10 MG/ML IV SOLN
INTRAVENOUS | Status: AC
  Filled 2015-01-28: qty 5

## 2015-01-28 MED ORDER — HEPARIN SODIUM (PORCINE) 1000 UNIT/ML IJ SOLN
INTRAMUSCULAR | Status: AC
  Filled 2015-01-28: qty 1

## 2015-01-28 MED ORDER — FENTANYL CITRATE (PF) 250 MCG/5ML IJ SOLN
INTRAMUSCULAR | Status: AC
  Filled 2015-01-28: qty 5

## 2015-01-28 MED ORDER — PROTAMINE SULFATE 10 MG/ML IV SOLN
INTRAVENOUS | Status: DC | PRN
  Administered 2015-01-28: 10 mg via INTRAVENOUS

## 2015-01-28 MED ORDER — HYDROMORPHONE HCL 1 MG/ML IJ SOLN
0.2500 mg | INTRAMUSCULAR | Status: DC | PRN
  Administered 2015-01-28 (×2): 0.5 mg via INTRAVENOUS

## 2015-01-28 MED ORDER — MEPERIDINE HCL 25 MG/ML IJ SOLN: 6.2500 mg | INTRAMUSCULAR | Status: DC | PRN

## 2015-01-28 MED ORDER — SODIUM CHLORIDE 0.9 % IV SOLN
500.0000 mL | Freq: Once | INTRAVENOUS | Status: DC | PRN
Start: 2015-01-28 — End: 2015-01-30

## 2015-01-28 MED ORDER — IODIXANOL 320 MG/ML IV SOLN
INTRAVENOUS | Status: DC | PRN
  Administered 2015-01-28: 56.4 mL

## 2015-01-28 MED ORDER — NEOSTIGMINE METHYLSULFATE 10 MG/10ML IV SOLN
INTRAVENOUS | Status: DC | PRN
Start: 2015-01-28 — End: 2015-01-28
  Administered 2015-01-28: 3 mg via INTRAVENOUS

## 2015-01-28 MED ORDER — FENTANYL CITRATE (PF) 100 MCG/2ML IJ SOLN
INTRAMUSCULAR | Status: DC | PRN
  Administered 2015-01-28: 150 ug via INTRAVENOUS
  Administered 2015-01-28 (×2): 50 ug via INTRAVENOUS

## 2015-01-28 MED ORDER — PHENYLEPHRINE HCL 10 MG/ML IJ SOLN
INTRAMUSCULAR | Status: DC | PRN
  Administered 2015-01-28 (×6): 40 ug via INTRAVENOUS

## 2015-01-28 MED ORDER — ROCURONIUM BROMIDE 100 MG/10ML IV SOLN
INTRAVENOUS | Status: DC | PRN
  Administered 2015-01-28: 40 mg via INTRAVENOUS

## 2015-01-28 MED ORDER — MIDAZOLAM HCL 5 MG/5ML IJ SOLN
INTRAMUSCULAR | Status: DC | PRN
  Administered 2015-01-28: 2 mg via INTRAVENOUS

## 2015-01-28 MED ORDER — SODIUM CHLORIDE 0.9 % IV SOLN: INTRAVENOUS | Status: DC

## 2015-01-28 MED ORDER — MAGNESIUM SULFATE 2 GM/50ML IV SOLN
2.0000 g | Freq: Every day | INTRAVENOUS | Status: DC | PRN
  Filled 2015-01-28: qty 50

## 2015-01-28 MED ORDER — DEXAMETHASONE SODIUM PHOSPHATE 4 MG/ML IJ SOLN
INTRAMUSCULAR | Status: AC
  Filled 2015-01-28: qty 1

## 2015-01-28 MED ORDER — HYDROMORPHONE HCL 1 MG/ML IJ SOLN
INTRAMUSCULAR | Status: AC
  Filled 2015-01-28: qty 1

## 2015-01-28 MED ORDER — ONDANSETRON HCL 4 MG/2ML IJ SOLN
INTRAMUSCULAR | Status: DC | PRN
  Administered 2015-01-28: 4 mg via INTRAVENOUS

## 2015-01-28 MED ORDER — PROPOFOL 10 MG/ML IV BOLUS
INTRAVENOUS | Status: DC | PRN
  Administered 2015-01-28: 100 mg via INTRAVENOUS

## 2015-01-28 MED ORDER — LIDOCAINE HCL (CARDIAC) 20 MG/ML IV SOLN
INTRAVENOUS | Status: AC
  Filled 2015-01-28: qty 5

## 2015-01-28 MED ORDER — POTASSIUM CHLORIDE CRYS ER 20 MEQ PO TBCR
20.0000 meq | EXTENDED_RELEASE_TABLET | Freq: Every day | ORAL | Status: DC | PRN
Start: 2015-01-28 — End: 2015-01-30

## 2015-01-28 SURGICAL SUPPLY — 65 items
BANDAGE ESMARK 6X9 LF (GAUZE/BANDAGES/DRESSINGS) IMPLANT
BNDG ESMARK 6X9 LF (GAUZE/BANDAGES/DRESSINGS)
CANISTER SUCTION 2500CC (MISCELLANEOUS) ×4 IMPLANT
CANNULA VESSEL 3MM 2 BLNT TIP (CANNULA) ×8 IMPLANT
CATH ANGIO 5F BER2 65CM (CATHETERS) ×4 IMPLANT
CATH EMB 4FR 80CM (CATHETERS) ×4 IMPLANT
CATH OMNI FLUSH .035X70CM (CATHETERS) ×4 IMPLANT
CLIP LIGATING EXTRA MED SLVR (CLIP) ×4 IMPLANT
CLIP LIGATING EXTRA SM BLUE (MISCELLANEOUS) ×4 IMPLANT
CUFF TOURNIQUET SINGLE 34IN LL (TOURNIQUET CUFF) IMPLANT
CUFF TOURNIQUET SINGLE 44IN (TOURNIQUET CUFF) IMPLANT
DRAIN SNY 10X20 3/4 PERF (WOUND CARE) IMPLANT
DRAPE X-RAY CASS 24X20 (DRAPES) IMPLANT
DRSG COVADERM 4X10 (GAUZE/BANDAGES/DRESSINGS) IMPLANT
DRSG COVADERM 4X8 (GAUZE/BANDAGES/DRESSINGS) IMPLANT
ELECT REM PT RETURN 9FT ADLT (ELECTROSURGICAL) ×4
ELECTRODE REM PT RTRN 9FT ADLT (ELECTROSURGICAL) ×2 IMPLANT
EVACUATOR SILICONE 100CC (DRAIN) IMPLANT
GAUZE SPONGE 4X4 12PLY STRL (GAUZE/BANDAGES/DRESSINGS) IMPLANT
GLOVE BIOGEL PI IND STRL 6.5 (GLOVE) ×4 IMPLANT
GLOVE BIOGEL PI IND STRL 7.5 (GLOVE) ×2 IMPLANT
GLOVE BIOGEL PI INDICATOR 6.5 (GLOVE) ×4
GLOVE BIOGEL PI INDICATOR 7.5 (GLOVE) ×2
GLOVE ECLIPSE 7.0 STRL STRAW (GLOVE) ×4 IMPLANT
GLOVE ECLIPSE 7.5 STRL STRAW (GLOVE) ×4 IMPLANT
GLOVE SS BIOGEL STRL SZ 6.5 (GLOVE) ×2 IMPLANT
GLOVE SS BIOGEL STRL SZ 7.5 (GLOVE) ×2 IMPLANT
GLOVE SUPERSENSE BIOGEL SZ 6.5 (GLOVE) ×2
GLOVE SUPERSENSE BIOGEL SZ 7.5 (GLOVE) ×2
GOWN STRL REUS W/ TWL LRG LVL3 (GOWN DISPOSABLE) ×6 IMPLANT
GOWN STRL REUS W/TWL LRG LVL3 (GOWN DISPOSABLE) ×6
INSERT FOGARTY SM (MISCELLANEOUS) IMPLANT
KIT BASIN OR (CUSTOM PROCEDURE TRAY) ×4 IMPLANT
KIT ENCORE 26 ADVANTAGE (KITS) ×8 IMPLANT
KIT ROOM TURNOVER OR (KITS) ×4 IMPLANT
LIQUID BAND (GAUZE/BANDAGES/DRESSINGS) ×4 IMPLANT
NEEDLE PERC 18GX7CM (NEEDLE) ×4 IMPLANT
NS IRRIG 1000ML POUR BTL (IV SOLUTION) ×8 IMPLANT
PACK PERIPHERAL VASCULAR (CUSTOM PROCEDURE TRAY) ×4 IMPLANT
PAD ARMBOARD 7.5X6 YLW CONV (MISCELLANEOUS) ×8 IMPLANT
PADDING CAST COTTON 6X4 STRL (CAST SUPPLIES) IMPLANT
PROTECTION STATION PRESSURIZED (MISCELLANEOUS) ×4
SET COLLECT BLD 21X3/4 12 (NEEDLE) IMPLANT
SHEATH BRITE TIP 6FR 35CM (SHEATH) IMPLANT
SHEATH BRITE TIP 7FR 35CM (SHEATH) ×8 IMPLANT
SHEATH PINNACLE 6F 10CM (SHEATH) ×8 IMPLANT
SHEATH PINNACLE R/O II 6F 4CM (SHEATH) IMPLANT
STAPLER VISISTAT 35W (STAPLE) IMPLANT
STATION PROTECTION PRESSURIZED (MISCELLANEOUS) ×2 IMPLANT
STENT GENESIS OPTA 8X29X80 (Permanent Stent) ×8 IMPLANT
STOPCOCK 4 WAY LG BORE MALE ST (IV SETS) IMPLANT
STOPCOCK MORSE 400PSI 3WAY (MISCELLANEOUS) ×4 IMPLANT
SUT ETHILON 3 0 PS 1 (SUTURE) IMPLANT
SUT PROLENE 5 0 C 1 24 (SUTURE) ×4 IMPLANT
SUT PROLENE 6 0 CC (SUTURE) ×24 IMPLANT
SUT SILK 2 0 SH (SUTURE) IMPLANT
SUT VIC AB 2-0 CTX 36 (SUTURE) ×8 IMPLANT
SUT VIC AB 3-0 SH 27 (SUTURE) ×4
SUT VIC AB 3-0 SH 27X BRD (SUTURE) ×4 IMPLANT
TRAY FOLEY W/METER SILVER 16FR (SET/KITS/TRAYS/PACK) ×4 IMPLANT
TUBING EXTENTION W/L.L. (IV SETS) IMPLANT
TUBING HIGH PRESSURE 120CM (CONNECTOR) ×4 IMPLANT
UNDERPAD 30X30 INCONTINENT (UNDERPADS AND DIAPERS) ×4 IMPLANT
WATER STERILE IRR 1000ML POUR (IV SOLUTION) ×4 IMPLANT
WIRE BENTSON .035X145CM (WIRE) ×12 IMPLANT

## 2015-01-28 NOTE — Anesthesia Procedure Notes (Signed)
Procedure Name: Intubation Date/Time: 01/28/2015 8:48 AM Performed by: Lovie CholOCK, Kellye Mizner K Pre-anesthesia Checklist: Patient identified, Emergency Drugs available, Suction available, Patient being monitored and Timeout performed Patient Re-evaluated:Patient Re-evaluated prior to inductionOxygen Delivery Method: Circle system utilized Preoxygenation: Pre-oxygenation with 100% oxygen Intubation Type: IV induction Ventilation: Mask ventilation without difficulty and Oral airway inserted - appropriate to patient size Laryngoscope Size: Miller and 2 Grade View: Grade II Tube type: Oral Tube size: 7.0 mm Number of attempts: 1 Placement Confirmation: ETT inserted through vocal cords under direct vision,  breath sounds checked- equal and bilateral and positive ETCO2 Tube secured with: Tape Dental Injury: Teeth and Oropharynx as per pre-operative assessment

## 2015-01-28 NOTE — Anesthesia Preprocedure Evaluation (Addendum)
Anesthesia Evaluation  Patient identified by MRN, date of birth, ID band Patient awake    Reviewed: Allergy & Precautions, NPO status , Patient's Chart, lab work & pertinent test results  History of Anesthesia Complications Negative for: history of anesthetic complications  Airway Mallampati: II  TM Distance: >3 FB Neck ROM: Full    Dental  (+) Teeth Intact, Dental Advisory Given   Pulmonary former smoker,    Pulmonary exam normal breath sounds clear to auscultation       Cardiovascular negative cardio ROS Normal cardiovascular exam Rhythm:Regular Rate:Normal     Neuro/Psych negative neurological ROS  negative psych ROS   GI/Hepatic negative GI ROS, Neg liver ROS,   Endo/Other  diabetes, Type 2, Oral Hypoglycemic AgentsHypothyroidism   Renal/GU negative Renal ROS     Musculoskeletal   Abdominal   Peds  Hematology   Anesthesia Other Findings   Reproductive/Obstetrics negative OB ROS                            Anesthesia Physical Anesthesia Plan  ASA: III  Anesthesia Plan: General   Post-op Pain Management:    Induction: Intravenous  Airway Management Planned: LMA  Additional Equipment:   Intra-op Plan:   Post-operative Plan: Extubation in OR  Informed Consent: I have reviewed the patients History and Physical, chart, labs and discussed the procedure including the risks, benefits and alternatives for the proposed anesthesia with the patient or authorized representative who has indicated his/her understanding and acceptance.     Plan Discussed with: CRNA and Surgeon  Anesthesia Plan Comments:         Anesthesia Quick Evaluation

## 2015-01-28 NOTE — H&P (View-Only) (Signed)
Subjective: Interval History: none.. Still with pain in her right foot but better controlled with morphine IV.  Objective: Vital signs in last 24 hours: Temp:  [97.7 F (36.5 C)-98.6 F (37 C)] 98.6 F (37 C) (11/03 0406) Pulse Rate:  [69-101] 72 (11/03 0406) Resp:  [10-21] 16 (11/03 0406) BP: (101-139)/(53-93) 101/59 mmHg (11/03 0406) SpO2:  [97 %-100 %] 100 % (11/03 0406) Weight:  [155 lb (70.308 kg)] 155 lb (70.308 kg) (11/02 1123)  Intake/Output from previous day: 11/02 0701 - 11/03 0700 In: 240 [P.O.:240] Out: 600 [Urine:600] Intake/Output this shift:    No change in her right foot. Cyanotic right great toe and metatarsal head area. Left groin without hematoma  Lab Results:  Recent Labs  01/26/15 1931 01/27/15 0338  WBC 11.9* 10.8*  HGB 12.9 12.0  HCT 39.6 37.4  PLT 288 269   BMET  Recent Labs  01/26/15 1931 01/27/15 0338  NA 141 137  K 3.9 4.2  CL 103 106  CO2 27 23  GLUCOSE 112* 112*  BUN 11 15  CREATININE 0.81 0.79  CALCIUM 9.5 9.1    Studies/Results: Dg Chest 2 View  01/27/2015  CLINICAL DATA:  Abdominal surgery. EXAM: CHEST  2 VIEW COMPARISON:  01/23/2011. FINDINGS: Mediastinum and hilar structures normal. The lungs are clear. Heart size normal. No pleural effusion or pneumothorax. No acute bony abnormality. IMPRESSION: No acute cardiopulmonary disease. Electronically Signed   By: Thomas  Register   On: 01/27/2015 07:14   Ct Angio Chest Aorta W/cm &/or Wo/cm  01/27/2015  CLINICAL DATA:  Evaluate source of embolism EXAM: CT ANGIOGRAPHY CHEST, ABDOMEN AND PELVIS TECHNIQUE: Multidetector CT imaging through the chest, abdomen and pelvis was performed using the standard protocol during bolus administration of intravenous contrast. Multiplanar reconstructed images and MIPs were obtained and reviewed to evaluate the vascular anatomy. CONTRAST:  100mL OMNIPAQUE IOHEXOL 350 MG/ML SOLN COMPARISON:  None. FINDINGS: CTA CHEST FINDINGS The thoracic aorta is normal  in caliber. There is no dissection. There is no stenosis. There is no significant plaque. The great vessel origins are widely patent with standard anatomy. Pulmonary vasculature is well opacified and patent with no pulmonary embolism. Review of the MIP images confirms the above findings. Nonvascular chest findings: The lungs are clear. Central airways are patent. There is no effusion. There is no adenopathy. No significant musculoskeletal abnormalities are evident. CTA ABDOMEN AND PELVIS FINDINGS The upper abdominal aorta is normal in caliber and widely patent. The celiac axis, superior mesenteric artery, inferior mesenteric artery, and both renal arteries are widely patent. In the infrarenal aorta, there is an ulcerated plaque anteriorly, seen to best advantage on axial image 141 series 4. Just above the aortic bifurcation there is a significant stenosis with greater than 70% diameter reduction. The AP diameter of the aorta is reduced to 3 mm 1-1.5 cm above the bifurcation. Both common iliac arteries are normal in caliber at their origins and are widely patent. Iliac bifurcations are widely patent. External iliac arteries and common femoral arteries are widely patent bilaterally. Femoral bifurcations are widely patent bilaterally. Review of the MIP images confirms the above findings. Nonvascular findings in the abdomen and pelvis: There are normal appearances of the liver, spleen, pancreas, adrenals, gallbladder and bile ducts. Both kidneys are normal. Uterus and adnexal structures appear unremarkable. Bowel is unremarkable. No significant musculoskeletal abnormalities. IMPRESSION: 1. No significant abnormality in the chest. 2. Ulcerated plaque at the anterior aspect of the infrarenal abdominal aorta, 4-5 cm above the bifurcation.   3. 70% stenosis of the distal abdominal aorta 1-1.5 cm above the bifurcation. Electronically Signed   By: Ellery Plunkaniel R Mitchell M.D.   On: 01/27/2015 06:04   Ct Angio Abd/pel W/ And/or  W/o  01/27/2015  CLINICAL DATA:  Evaluate source of embolism EXAM: CT ANGIOGRAPHY CHEST, ABDOMEN AND PELVIS TECHNIQUE: Multidetector CT imaging through the chest, abdomen and pelvis was performed using the standard protocol during bolus administration of intravenous contrast. Multiplanar reconstructed images and MIPs were obtained and reviewed to evaluate the vascular anatomy. CONTRAST:  100mL OMNIPAQUE IOHEXOL 350 MG/ML SOLN COMPARISON:  None. FINDINGS: CTA CHEST FINDINGS The thoracic aorta is normal in caliber. There is no dissection. There is no stenosis. There is no significant plaque. The great vessel origins are widely patent with standard anatomy. Pulmonary vasculature is well opacified and patent with no pulmonary embolism. Review of the MIP images confirms the above findings. Nonvascular chest findings: The lungs are clear. Central airways are patent. There is no effusion. There is no adenopathy. No significant musculoskeletal abnormalities are evident. CTA ABDOMEN AND PELVIS FINDINGS The upper abdominal aorta is normal in caliber and widely patent. The celiac axis, superior mesenteric artery, inferior mesenteric artery, and both renal arteries are widely patent. In the infrarenal aorta, there is an ulcerated plaque anteriorly, seen to best advantage on axial image 141 series 4. Just above the aortic bifurcation there is a significant stenosis with greater than 70% diameter reduction. The AP diameter of the aorta is reduced to 3 mm 1-1.5 cm above the bifurcation. Both common iliac arteries are normal in caliber at their origins and are widely patent. Iliac bifurcations are widely patent. External iliac arteries and common femoral arteries are widely patent bilaterally. Femoral bifurcations are widely patent bilaterally. Review of the MIP images confirms the above findings. Nonvascular findings in the abdomen and pelvis: There are normal appearances of the liver, spleen, pancreas, adrenals, gallbladder and  bile ducts. Both kidneys are normal. Uterus and adnexal structures appear unremarkable. Bowel is unremarkable. No significant musculoskeletal abnormalities. IMPRESSION: 1. No significant abnormality in the chest. 2. Ulcerated plaque at the anterior aspect of the infrarenal abdominal aorta, 4-5 cm above the bifurcation. 3. 70% stenosis of the distal abdominal aorta 1-1.5 cm above the bifurcation. Electronically Signed   By: Ellery Plunkaniel R Mitchell M.D.   On: 01/27/2015 06:04   Anti-infectives: Anti-infectives    None      Assessment/Plan: s/p Procedure(s): Abdominal Aortogram (N/A) Reviewed CT angiogram of her chest abdomen. This shows no evidence of source for embolus from her chest. She does have ulcerative plaque proximal 45 cm above her aortic bifurcation and high-grade stenosis with either plaque or thrombus at her aortic bifurcation. Will obtain 2-D echo to rule out cardiogenic source for embolus. Plan bilateral groin exploration and attempted thrombectomy versus angioplasty of her aortic bifurcation in the operating room tomorrow.   LOS: 1 day   Gretta Beganarly, Hina Gupta 01/27/2015, 9:25 AM

## 2015-01-28 NOTE — Progress Notes (Signed)
  [facility] Day [facility] Note  Subjective:  Comfortable. Minor pain right foot.   Filed Vitals:   01/28/15 1600  BP: 112/78  Pulse: 83  Temp: 98.1 F (36.7 C)  Resp: 21   Bilateral groin incisions clean and intact.  Doppler flow DP/PT bilaterally  Right great toe mildly erythematous.    Assessment/Plan:  This is a 49 y.o. female who is s/p bilateral groin exploration, aortogram with bilateral common iliac artery balloon expandable stents.   Stable post-op.  Good doppler flow bilaterally.  Keep dry gauze in groins to wick moisture.   Maris BergerKimberly Lillianne Eick, [place] JerseyPA-C Pager: [phone] 01/28/2015 5:32 PM

## 2015-01-28 NOTE — Interval H&P Note (Signed)
History and Physical Interval Note:  01/28/2015 7:29 AM  Brianna Powers  has presented today for surgery, with the diagnosis of Ischemic right toes M62.89  The various methods of treatment have been discussed with the patient and family. After consideration of risks, benefits and other options for treatment, the patient has consented to  Procedure(s): THROMBECTOMY FEMORAL ARTERY; POSSIBLE STENT (Bilateral) as a surgical intervention .  The patient's history has been reviewed, patient examined, no change in status, stable for surgery.  I have reviewed the patient's chart and labs.  Questions were answered to the patient's satisfaction.     Gretta BeganEarly, Todd

## 2015-01-28 NOTE — Anesthesia Postprocedure Evaluation (Signed)
  Anesthesia Post-op Note  Patient: Brianna Powers  Procedure(s) Performed: Procedure(s): INSERTION OF RIGHT AND LEFT ILIAC STENT (Bilateral) BILATERAL FEMORAL ARTERY EXPLORATION (Bilateral)  Patient Location: PACU  Anesthesia Type:General  Level of Consciousness: sedated, patient cooperative and responds to stimulation  Airway and Oxygen Therapy: Patient Spontanous Breathing and Patient connected to nasal cannula oxygen  Post-op Pain: mild  Post-op Assessment: Post-op Vital signs reviewed, Patient's Cardiovascular Status Stable, Respiratory Function Stable, Patent Airway, No signs of Nausea or vomiting and Pain level controlled              Post-op Vital Signs: Reviewed and stable  Last Vitals:  Filed Vitals:   01/28/15 1245  BP: 114/70  Pulse: 54  Temp: 36.7 C  Resp: 14    Complications: No apparent anesthesia complications

## 2015-01-28 NOTE — Op Note (Signed)
OPERATIVE REPORT  DATE OF SURGERY: 01/28/2015  PATIENT: Brianna Powers, 49 y.o. female MRN: [id number]  DOB: 11-21-1965  PRE-OPERATIVE DIAGNOSIS: stenosis at aortic bifurcation possibly related to thrombus versus plaque,eembolus to her right foot  POST-OPERATIVE DIAGNOSIS:  Same  PROCEDURE: bilateral groin exploration, aortogram with bilateral common iliac artery balloon expandable stents  SURGEON:  Gretta Beganodd Early, M.D.  PHYSICIAN ASSISTANT: Collins  ANESTHESIA:  Gen.  EBL: less than 100 ml  Total I/O In: 1250 [I.V.:1250] Out: 100 [Urine:100]  BLOOD ADMINISTERED: none  DRAINS: none  SPECIMEN: none  COUNTS CORRECT:  YES  PLAN OF CARE: PACU   PATIENT DISPOSITION:  PACU - hemodynamically stable  PROCEDURE DETAILS: The patient was taken to the operative placed supine position where the area of his groins and abdomen were prepped and draped in usual sterile fashion. Using oblique incisions at the level of the inguinal ligament with SonoSite visualization, bilateral groin incisions were made over this area. Carried down to isolate the common femoral artery at the inguinal ligament bilaterally. Patient had small common femoral arteries bilaterally with minimal plaque. On the right side the anterior wall of the common femoral artery was entered with an 18-gauge needle a guidewire was passed centrally this was confirmed with fluoroscopy. A 5 French sheath was passed over the guidewire. On the left groin the common femoral artery was occluded with a Henley clamp at the level of inguinal ligament and distally with a vessel loop. The patient had been given 7000 units intravenous heparin prior to placing the right groin sheath. A guidewire was passed through the left iliac vessels. This did go up into the aorta but would not pass further proximally. Hand injection showed some filling defects with possible dissection. For this reason a pigtail catheter was positioned through the right  groin sheath and this showed the that there have been a dissection of the aorta from the wire coming from the left side. The injection to the catheter showed the remaining portion the aorta were normal. The wire through the left groin was brought back into the iliac vessels and was readvanced and was  In the aorta and this was confirmed with x-ray of contrast study. The patient had a preoperative CT showed the 8 mm to 9 mm iliac vessels past bifurcation. Long 7 French sheath were passed through the level of the stenosis at the bifurcation and 29 mm stents mounted on 8mm balloon were positioned at the level of the aortic bifurcation and the long sheaths were withdrawn. Hand injections were used to confirm that this was the appropriate location for angioplasty in both right and left common iliac up balloons were expanded in a kissing technique. Repeat arteriogram showed excellent result through this area with no residual narrowing. There did appear to be a late blush with some residual a dissection on the lateral aspect of the aorta with no flow-limiting stenosis and no extravasation. Decision was made to observe this only. The guidewire and sheath were removed and the common femoral arteries were occluded proximal distal to the site bilaterally. The common femoral arteries were closed with interrupted 60 proline suture. Lotion maneuvers were undertaken and no thrombus was noted. Should be noted that attempt was made to place a 4 Fogarty catheter through the anastomosis and this was unsuccessful prior to the angioplasty. The clamps removed after closure of the common femoral arteries and normal pulse was noted. The patient was given 50 mg of protamine to reverse the heparin. Wounds irrigated  with saline. Hemostasis tablet cautery. Wounds were closed with 20 Vicryls several layers and subcutaneous tissue. Skin was closed with 4-0 subcuticular Monocryl sutures. Sterile dressing was applied the patient was transferred to  the recovery room stable condition. Patient had biphasic posterior tibial signals bilaterally.   Gretta Began, M.D. 01/28/2015 11:23 AM

## 2015-01-28 NOTE — Transfer of Care (Signed)
Immediate Anesthesia Transfer of Care Note  Patient: Brianna Powers  Procedure(s) Performed: Procedure(s): INSERTION OF RIGHT AND LEFT ILIAC STENT (Bilateral) BILATERAL FEMORAL ARTERY EXPLORATION (Bilateral)  Patient Location: PACU  Anesthesia Type:General  Level of Consciousness: awake, alert  and oriented  Airway & Oxygen Therapy: Patient Spontanous Breathing and Patient connected to nasal cannula oxygen  Post-op Assessment: Report given to RN and Post -op Vital signs reviewed and stable  Post vital signs: Reviewed and stable  Last Vitals:  Filed Vitals:   01/28/15 0456  BP: 114/74  Pulse: 76  Temp: 36.8 C  Resp: 16    Complications: No apparent anesthesia complications

## 2015-01-29 LAB — BASIC METABOLIC PANEL
ANION GAP: 8 (ref 5–15)
BUN: 7 mg/dL (ref 6–20)
CALCIUM: 9 mg/dL (ref 8.9–10.3)
CO2: 25 mmol/L (ref 22–32)
CREATININE: 0.7 mg/dL (ref 0.44–1.00)
Chloride: 106 mmol/L (ref 101–111)
GFR calc Af Amer: >60 mL/min (ref >60)
GLUCOSE: 102 mg/dL — AB (ref 65–99)
Potassium: 3.8 mmol/L (ref 3.5–5.1)
Sodium: 139 mmol/L (ref 135–145)

## 2015-01-29 LAB — CBC
HEMATOCRIT: 34.8 % — AB (ref 36.0–46.0)
Hemoglobin: 11.2 g/dL — ABNORMAL LOW (ref 12.0–15.0)
MCH: 30.3 pg (ref 26.0–34.0)
MCHC: 32.2 g/dL (ref 30.0–36.0)
MCV: 94.1 fL (ref 78.0–100.0)
PLATELETS: 249 10*3/uL (ref 150–400)
RBC: 3.7 MIL/uL — ABNORMAL LOW (ref 3.87–5.11)
RDW: 12.8 % (ref 11.5–15.5)
WBC: 13.4 10*3/uL — AB (ref 4.0–10.5)

## 2015-01-29 NOTE — Progress Notes (Addendum)
       Mrs. Brianna Powers will need to be out of work for 3 weeks.  She has had vascular surgery 01/28/2015 By Dr. Arbie CookeyEarly.   She will have a follow visit 3 weeks after she is discharged from the hospital.    [name], [facility] PA-C

## 2015-01-29 NOTE — Progress Notes (Signed)
Mrs. Brianna Powers received to room 2W36 via wheel chair. Assisted to chair, oriented to room and call system, telemetry monitor applied and CCMD notified. Pt denies significant discomfort or needs at present. Husband and daughter at bedside. Call bell and phone in reach.

## 2015-01-29 NOTE — Evaluation (Signed)
Physical Therapy Evaluation Patient Details Name: Brianna Powers MRN: [id number] DOB: November 12, 1965 Today's Date: 01/29/2015   History of Present Illness  49 [facility]. [facility] s/p bil groin exploration, aortogram with bilateral common illiac artery ballon expandable stents on 01/28/15.  Pt with significant PMHx of DM.    Clinical Impression  Pt is requiring the support of RW during gait to help with pain. Encouraged upright posture.  She will likely not need any PT f/u at discharge, but will likely need a RW and HH aide if she qualifies.   PT to follow acutely for deficits listed below.       Follow Up Recommendations No PT follow up;Other (comment) (HH aide to help at home)    Equipment Recommendations  Rolling walker with 5" wheels    Recommendations for Other Services   NA    Precautions / Restrictions Precautions Precautions: None      Mobility  Bed Mobility Overal bed mobility: Modified Independent             General bed mobility comments: HOB maximally elevated and pt using bed rail.   Transfers Overall transfer level: Needs assistance Equipment used: Rolling walker (2 wheeled) Transfers: Sit to/from Stand Sit to Stand: Supervision         General transfer comment: supervision for safety  Ambulation/Gait Ambulation/Gait assistance: Min guard Ambulation Distance (Feet): 150 Feet Assistive device: Rolling walker (2 wheeled) Gait Pattern/deviations: Step-through pattern;Shuffle;Trunk flexed Gait velocity: decreased Gait velocity interpretation: Below normal speed for age/gender General Gait Details: verbal cues for upright posture. RW adjusted down for short stature.          Balance Overall balance assessment: Needs assistance Sitting-balance support: Feet supported;No upper extremity supported Sitting balance-Leahy Scale: Good     Standing balance support: Bilateral upper extremity supported;No upper extremity  supported;Single extremity supported Standing balance-Leahy Scale: Fair                               Pertinent Vitals/Pain Pain Assessment: Faces Pain Score: 0-No pain (at rest) Faces Pain Scale: Hurts little more Pain Location: abdomen and bil groins while moving Pain Descriptors / Indicators: Grimacing;Guarding Pain Intervention(s): Limited activity within patient's tolerance;Monitored during session;Repositioned    Home Living Family/patient expects to be discharged to:: Private residence Living Arrangements: Spouse/significant other;Children Available Help at Discharge: Family;Available 24 hours/day (husband has taken off, but pt wants him to go back to work) Type of Home: House Home Access: Stairs to enter Entrance Stairs-Rails: None Entrance Stairs-Number of Steps: 2 Home Layout: Two level;Bed/bath upstairs Home Equipment: None;Other (comment) (none of her own, her parents live there and have equipment. 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  Pt unable to lift legs against gravity to get them back into the bed unassisted.  LLE Deficits / Details: bil hips limited by post operative pain.  Pt unable to lift legs against gravity to get them back into the bed unassisted.   Cervical /  Trunk Assessment: Normal  Communication   Communication: No difficulties  Cognition Arousal/Alertness: Awake/alert Behavior During Therapy: WFL for tasks assessed/performed Overall Cognitive Status: Within Functional Limits for tasks assessed                                Assessment/Plan    PT Assessment Patient needs continued PT services  PT Diagnosis Difficulty walking;Abnormality of gait;Generalized weakness;Acute pain   PT Problem List Decreased strength;Decreased activity tolerance;Decreased balance;Decreased mobility;Decreased knowledge of use of DME;Pain  PT Treatment Interventions DME instruction;Gait training;Stair training;Functional mobility training;Therapeutic activities;Therapeutic exercise;Balance training;Neuromuscular re-education;Patient/family education;Modalities   PT Goals (Current goals can be found in the Care Plan section) Acute Rehab PT Goals Patient Stated Goal: to be able to care for herself at discharge PT Goal Formulation: With patient Time For Goal Achievement: 02/12/15 Potential to Achieve Goals: Good    Frequency Min 3X/week   Barriers to discharge Decreased caregiver support pt's husband needs to return to work ASAP for financial reasons       End of Session   Activity Tolerance: Patient limited by fatigue;Patient limited by pain Patient left: in bed;with call bell/phone within reach Nurse Communication: Mobility status         Time: 1610-9604 PT Time Calculation (min) (ACUTE ONLY): 23 min   Charges:   PT Evaluation $Initial PT Evaluation Tier I: 1 Procedure PT Treatments $Gait Training: 8-22 mins        Brianna Powers, PT, DPT [phone]   01/29/2015, 4:53 PM

## 2015-01-29 NOTE — Progress Notes (Addendum)
[facility] [place]  Subjective  - Doing well sitting up this am.   Objective 109/95 69 98.8 F (37.1 C) (Oral) 12 99%  Intake/Output Summary (Last 24 hours) at 01/29/15 0756 Last data filed at 01/29/15 0700  Gross per 24 hour  Intake   3825 ml  Output   3000 ml  Net    825 ml    Doppler PT/DP biphasic  Groin incisions clean and dry Heart RRR Lungs non labored breathing  Assessment/Planning: POD # 1 This is a 49 y.o. female who is s/p bilateral groin exploration, aortogram with bilateral common iliac artery balloon expandable stents.  Stable disposition good doppler flow  Transfer to 2w   Clinton GallantCOLLINS, EMMA [facility] 01/29/2015 7:56 AM --  Laboratory Lab Results:  Recent Labs  01/28/15 0242 01/29/15 0448  WBC 10.3 13.4*  HGB 12.0 11.2*  HCT 37.0 34.8*  PLT 266 249   BMET  Recent Labs  01/28/15 0242 01/29/15 0448  NA 141 139  K 4.1 3.8  CL 107 106  CO2 26 25  GLUCOSE 108* 102*  BUN 11 7  CREATININE 0.86 0.70  CALCIUM 9.0 9.0    COAG Lab Results  Component Value Date   INR 1.06 01/26/2015   No results found for: PTT    I have examined the patient, reviewed and agree with above.Marked improvement in R foot.  Cyanosis resolved. Groin wounds healing well with no hematoma 2+ right popliteal and right posterior tibial pulse. 2+ left dorsalis pedis pulse. Stable for transfer to 2 Colin RheinW.  Erisha Paugh, MD 01/29/2015 8:33 AM

## 2015-01-30 ENCOUNTER — Inpatient Hospital Stay (HOSPITAL_COMMUNITY): Payer: Managed Care, Other (non HMO)

## 2015-01-30 DIAGNOSIS — Z9889 Other specified postprocedural states: Secondary | ICD-10-CM

## 2015-01-30 MED ORDER — TRAMADOL HCL 50 MG PO TABS: 50.0000 mg | ORAL_TABLET | Freq: Four times a day (QID) | ORAL | Status: DC | PRN

## 2015-01-30 MED ORDER — HYDROCODONE-ACETAMINOPHEN 5-325 MG PO TABS: 1.0000 | ORAL_TABLET | ORAL | Status: DC | PRN

## 2015-01-30 MED ORDER — CLOPIDOGREL BISULFATE 75 MG PO TABS: 75.0000 mg | ORAL_TABLET | Freq: Every day | ORAL | Status: DC

## 2015-01-30 NOTE — Care Management Note (Signed)
Case Management Note  Patient Details  Name: Honor Loheressa K No MRN: [id number] Date of Birth: 1965-11-22  Subjective/Objective:                  bil groin exploration, aortogram with bilateral common illiac artery ballon expandable stents  Action/Plan:  Discharge planning Expected Discharge Date:  01/30/15               Expected Discharge Plan:  Home/Self Care (Pt is from home with husband, uses walker)  In-House Referral:     Discharge planning Services  CM Consult  Post Acute Care Choice:  Durable Medical Equipment Choice offered to:     DME Arranged:  3-N-1, Walker rolling DME Agency:  [facility]  HH Arranged:  NA HH Agency:  NA  Status of Service:  Completed, signed off  Medicare Important Message Given:    Date Medicare IM Given:    Medicare IM give by:    Date Additional Medicare IM Given:    Additional Medicare Important Message give by:     If discussed at Long Length of Stay Meetings, dates discussed:    Additional Comments: Cm spoke with pt in room to confirm need for 3n1 and rolling walker.  Orders requested.  CM called [facility] DME rep, Trey PaulaJeff to please deliver the 3n1 and rolling wlaker to room prior to discharge.  No HH services recc or ordered.  No other CM needs were communicated. Yves DillJeffries, Furious Chiarelli Christine, RN 01/30/2015, 12:52 PM

## 2015-01-30 NOTE — Progress Notes (Signed)
VASCULAR LAB PRELIMINARY  ARTERIAL  ABI completed:ABI indicates increase in right lower extremity arterial flow, since surgery.  Right ABI: 1.15 Left ABI: 1.03.    RIGHT    LEFT    PRESSURE WAVEFORM  PRESSURE WAVEFORM  BRACHIAL IV/painful  BRACHIAL 121 T  DP   DP    AT 75 M AT 118 B  PT 139 M PT 125 B  PER   PER    GREAT TOE  NA GREAT TOE  NA    RIGHT LEFT  ABI 1.15 1.03     Ansar Skoda, RVT 01/30/2015, 8:47 AM

## 2015-01-30 NOTE — Progress Notes (Signed)
[facility] [place]  Subjective  - Doing well oevr all. Need rolling walker and 3 in 1 for home use.   Objective 101/39 80 98.3 F (36.8 C) (Oral) 16 99%  Intake/Output Summary (Last 24 hours) at 01/30/15 0931 Last data filed at 01/29/15 1000  Gross per 24 hour  Intake    125 ml  Output    100 ml  Net     25 ml   ABI completed:ABI indicates increase in right lower extremity arterial flow, since surgery. Right ABI: 1.15 Left ABI: 1.03.    RIGHT    LEFT    PRESSURE WAVEFORM  PRESSURE WAVEFORM  BRACHIAL IV/painful  BRACHIAL 121 T  DP   DP    AT 75 M AT 118 B  PT 139 M PT 125 B  PER   PER    GREAT TOE  NA GREAT TOE  NA    RIGHT LEFT  ABI 1.15 1.03        Palpable DP bilateral Right foot pink with min-mod foot edema Sensation grossly intact left foot Right DT/PT palpable  Assessment/Planning: POD # 2 is is a 49 y.o. female who is s/p bilateral groin exploration, aortogram with bilateral common iliac artery balloon expandable stents.   Disposition stable  Discharge home today  Medication changes include plavix daily 75 mg   Clinton GallantCOLLINS, EMMA [facility] 01/30/2015 9:31 AM --  Laboratory Lab Results:  Recent Labs  01/28/15 0242 01/29/15 0448  WBC 10.3 13.4*  HGB 12.0 11.2*  HCT 37.0 34.8*  PLT 266 249   BMET  Recent Labs  01/28/15 0242 01/29/15 0448  NA 141 139  K 4.1 3.8  CL 107 106  CO2 26 25  GLUCOSE 108* 102*  BUN 11 7  CREATININE 0.86 0.70  CALCIUM 9.0 9.0    COAG Lab Results  Component Value Date   INR 1.06 01/26/2015   No results found for: PTT

## 2015-01-30 NOTE — Progress Notes (Signed)
Discharged with instructions and prescription, voiced understanding. 

## 2015-01-31 ENCOUNTER — Encounter (HOSPITAL_COMMUNITY): Payer: Self-pay | Admitting: Vascular Surgery

## 2015-01-31 ENCOUNTER — Other Ambulatory Visit: Payer: Self-pay

## 2015-01-31 DIAGNOSIS — I350 Nonrheumatic aortic (valve) stenosis: Secondary | ICD-10-CM

## 2015-01-31 DIAGNOSIS — Z48812 Encounter for surgical aftercare following surgery on the circulatory system: Secondary | ICD-10-CM

## 2015-01-31 DIAGNOSIS — Z95828 Presence of other vascular implants and grafts: Secondary | ICD-10-CM

## 2015-02-01 DIAGNOSIS — Z0279 Encounter for issue of other medical certificate: Secondary | ICD-10-CM

## 2015-02-02 ENCOUNTER — Telehealth: Payer: Self-pay | Admitting: Vascular Surgery

## 2015-02-02 NOTE — Discharge Summary (Signed)
[facility] Discharge Summary   Patient ID:  Brianna Powers MRN: [id number] DOB/AGE: 48/10/1965 48 y.o.  Admit date: 01/26/2015 Discharge date: 01/30/2015 Date of Surgery: 01/26/2015 - 01/28/2015 Surgeon: Juliann Mule): Rosetta Posner, MD  Admission Diagnosis: ischemic right toes Ischemic right toes M62.89  Discharge Diagnoses:  ischemic right toes Ischemic right toes M62.89  Secondary Diagnoses: Past Medical History  Diagnosis Date  . Diabetes mellitus without complication ([place])   . Hypothyroidism     Procedure(s): INSERTION OF RIGHT AND LEFT ILIAC STENT BILATERAL FEMORAL ARTERY EXPLORATION  Discharged Condition: good  HPI: Patient resents today for a discussion of her right foot. She is an otherwise healthy 49 year old female with a several month history of difficulty in her right foot. Been diagnosed with Buerger's and with rain onset dominant. Initially she reports that this would come and go but she has had consistent pain in her right foot with cyanotic and mottled changing. This is become progressively worse and now her right toe and the area of the ball of her right great toe remains blue and painful. This is worse with cold exposure. Difficult for her to walk. This is all new for her. She does not have any similar symptoms in her hands or in her left foot. She does not have any ulcerations. [facility] Vascular Lab Studies:  Ordered and Independently Reviewed markedly dampened flow in the digits of all the toes on her right foot. She has monophasic flow in the posterior tibial and anterior tibial right. Biphasic flow on the left. Ankle arm indexes 0.77 on the right and 0.90 on the left  Impression and Plan:  Had long discussion with patient. Unclear as to etiology of the ischemia of the toes on her right foot. This could be embolic. Doubtful that this is Raynaud's with only her right foot and no hand involvement. Have recommended arteriography for further  evaluation. She wished to proceed as soon as possible be added to her scheduled for tomorrow. Will make further recommendations depending on her study. Explain that there is a very high likelihood that there will be nothing correctable regarding her foot ischemia.  Angiography findings: #1 subtotal occlusion of the aortic bifurcation possibly related to embolic disease versus focal plaque #2 widely patent iliac vessels common femoral superficial popliteal and three-vessel proximal vessels #3 occlusion of anterior tibial and peroneal arteries bilaterally with posterior tibial runoff into the foot  Hospital Course:  Brianna Powers is a 49 y.o. female is S/P  Procedure(s): bilateral groin exploration, aortogram with bilateral common iliac artery balloon expandable stents.  There did appear to be a late blush with some residual a dissection on the lateral aspect of the aorta with no flow-limiting stenosis and no extravasation. Decision was made to observe this only.  POD#1 Doppler PT/DP biphasic  Groin incisions clean and dry Heart RRR Lungs non labored breathing Transfer to 2W  POD#2 ABI completed:ABI indicates increase in right lower extremity arterial flow, since surgery. Right ABI: 1.15 Left ABI: 1.03.    RIGHT    LEFT    PRESSURE WAVEFORM  PRESSURE WAVEFORM  BRACHIAL IV/painful  BRACHIAL 121 T  DP   DP    AT 75 M AT 118 B  PT 139 M PT 125 B  PER   PER    GREAT TOE  NA GREAT TOE  NA    RIGHT LEFT  ABI 1.15 1.03           Disposition stable  Discharge home today  Medication changes include plavix daily 75 mg   Significant Diagnostic Studies: CBC Lab Results  Component Value Date   WBC 13.4* 01/29/2015   HGB 11.2* 01/29/2015   HCT 34.8* 01/29/2015   MCV 94.1 01/29/2015   PLT 249 01/29/2015    BMET    Component Value Date/Time   NA 139  01/29/2015 0448   K 3.8 01/29/2015 0448   CL 106 01/29/2015 0448   CO2 25 01/29/2015 0448   GLUCOSE 102* 01/29/2015 0448   BUN 7 01/29/2015 0448   CREATININE 0.70 01/29/2015 0448   CALCIUM 9.0 01/29/2015 0448   GFRNONAA >60 01/29/2015 0448   GFRAA >60 01/29/2015 0448   COAG Lab Results  Component Value Date   INR 1.06 01/26/2015     Disposition:  Discharge to :Home Discharge Instructions    Call MD for:  redness, tenderness, or signs of infection (pain, swelling, bleeding, redness, odor or green/yellow discharge around incision site)    Complete by:  As directed      Call MD for:  severe or increased pain, loss or decreased feeling  in affected limb(s)    Complete by:  As directed      Call MD for:  temperature >100.5    Complete by:  As directed      Discharge instructions    Complete by:  As directed   You may shower daily with soap and water.  Thoroughly dry incisions.     Driving Restrictions    Complete by:  As directed   No driving for 1 weeks     Increase activity slowly    Complete by:  As directed   Walk with assistance use walker or cane as needed     Lifting restrictions    Complete by:  As directed   No lifting for 6 weeks     Resume previous diet    Complete by:  As directed             Medication List    TAKE these medications        ACCU-CHEK AVIVA PLUS test strip  Generic drug:  glucose blood  TEST twice a day     ACCU-CHEK SOFTCLIX LANCETS lancets  TEST twice a day     clopidogrel 75 MG tablet  Commonly known as:  PLAVIX  Take 1 tablet (75 mg total) by mouth daily.     gabapentin 100 MG capsule  Commonly known as:  NEURONTIN  Take 100 mg by mouth 2 (two) times daily as needed. pain     glucose monitoring kit monitoring kit  1 each by Does not apply route as needed for other. Use as directed     HYDROcodone-acetaminophen 5-325 MG tablet  Commonly known as:  NORCO/VICODIN  Take 1 tablet by mouth every 8 (eight) hours as needed. for  pain     HYDROcodone-acetaminophen 5-325 MG tablet  Commonly known as:  NORCO/VICODIN  Take 1 tablet by mouth every 4 (four) hours as needed for moderate pain.     metFORMIN 500 MG tablet  Commonly known as:  GLUCOPHAGE  Take 500 mg by mouth 2 (two) times daily.     SYNTHROID 100 MCG tablet  Generic drug:  levothyroxine  take 1 tablet by mouth once daily     traMADol 50 MG tablet  Commonly known as:  ULTRAM  Take 50 mg by mouth every 6 (six) hours as needed. pain  traMADol 50 MG tablet  Commonly known as:  ULTRAM  Take 1 tablet (50 mg total) by mouth every 6 (six) hours as needed for moderate pain.       Verbal and written Discharge instructions given to the patient. Wound care per Discharge AVS     Follow-up Information    Follow up with Curt Jews, MD In 3 weeks.   Specialties:  Vascular Surgery, Cardiology   Why:  Office will call you to arrange your appt (sent)   Contact information:   [place] [address] [phone]       Follow up with [facility].   Why:  rolling walker and 3n1   Contact information:   [place] [address] [phone]       Signed: Laurence Slate [facility] 02/02/2015, 11:01 AM  - For VQI Registry use --- Instructions: Press F2 to tab through selections.  Delete question if not applicable.   Post-op:  Time to Extubation: [x ] In OR, _0  <12 hrs, _1  12-24 hrs, _2  > 24 hrs Vasopressors: No  ICU Stay: 1 days  Transfusion: No  If yes, 0 units given New Arrhythmia: No Ipsilateral amputation: [x ] no, _3  Minor, _4  BKA, _5  AKA Discharge patency: [x ] Primary, _6  Primary assisted, _7  Secondary, _8  Occluded Patency judged by: _9  Dopper only, _10  Palpable graft pulse, [x ] Palpable distal pulse, _11  ABI inc. > 0.15, _12  Duplex Discharge ABI: R 1.15, L 1.03  D/C Ambulatory Status: Ambulatory  Complications: Wound complication: [x ] No, <[id number]>_1<\/UXNATFTDDUKGURKY>_70  Superficial, _14  Return to  OR  Graft infection: No  Leg ischemia/emboli: [x ] No, _15  Yes, no Surgery, _16  Yes, Surgery req., _17  Amputation If amputation: side: _18  R: _19  minor, _20  BKA, _21  AKA; _22  L: _23  Minor, _24  BKA, _25  AKA  MI: _26  No, _27  Troponin only, _28  EKG or Clinical CHF: No Resp failure: [x ] none, _29  Pneumonia, _30  Ventilator Chg in renal function: [x ] none, _31  Inc. Cr > 0.5, _32  Temp. Dialysis, _33  Permanent dialysis Stroke: [x ] None, _34  Minor, _35  Major Return to OR: No  Reason for return to OR: _36  Bleeding, _37  Infection, _38  Thrombosis, _39  Revision  Discharge medications: Statin use:  No  for medical reason   ASA use:  No  for medical reason   Plavix use:  yes Beta blocker use: No  for medical reason   Coumadin use: No  for medical reason

## 2015-02-02 NOTE — Telephone Encounter (Addendum)
-----   Message from Phillips Odorarol S Pullins, RN sent at 01/31/2015  9:30 AM EST ----- Regarding: Joyce GrossKay log;  also needs 3 wk f/u with CTA abd/ pelvis, and appt. with TFE   ----- Message -----    From: Barnett Hatteregina Seymour    Sent: 01/31/2015   7:57 AM      To: [facility] Charge Pool    ----- Message -----    From: Lars MageEmma M Collins, PA-C    Sent: 01/30/2015   9:11 AM      To: [facility] Charge Pool  F/U in thee weeks with Dr. Arbie CookeyEarly  S/P external iliac stents with aortic dissection.  Needs CTA abdomin and pelvis for follow up appointment.  TFE - 3 wk fu s/p external iliac stents with aortic dissection, needs CTA abd/pelvis prior to seeing MD as per staff message, notified patient of post op appt., mailed cta instructions and sent to debbie for prior auth 02-02-15   bar

## 2015-02-15 ENCOUNTER — Ambulatory Visit
Admission: RE | Admit: 2015-02-15 | Discharge: 2015-02-15 | Disposition: A | Discharge status: Home / Self Care | Payer: Managed Care, Other (non HMO) | Source: Ambulatory Visit | Attending: Vascular Surgery | Admitting: Vascular Surgery

## 2015-02-15 ENCOUNTER — Encounter: Payer: Self-pay | Admitting: Vascular Surgery

## 2015-02-15 DIAGNOSIS — I350 Nonrheumatic aortic (valve) stenosis: Secondary | ICD-10-CM

## 2015-02-15 DIAGNOSIS — Z95828 Presence of other vascular implants and grafts: Secondary | ICD-10-CM

## 2015-02-15 DIAGNOSIS — Z48812 Encounter for surgical aftercare following surgery on the circulatory system: Secondary | ICD-10-CM

## 2015-02-15 MED ORDER — IOPAMIDOL (ISOVUE-370) INJECTION 76%
75.0000 mL | Freq: Once | INTRAVENOUS | Status: AC | PRN
  Administered 2015-02-15: 75 mL via INTRAVENOUS

## 2015-02-22 ENCOUNTER — Encounter: Payer: Self-pay | Admitting: Vascular Surgery

## 2015-02-22 ENCOUNTER — Ambulatory Visit (INDEPENDENT_AMBULATORY_CARE_PROVIDER_SITE_OTHER): Payer: Managed Care, Other (non HMO) | Admitting: Vascular Surgery

## 2015-02-22 VITALS — BP 113/81 | HR 95 | Temp 98.2°F | Ht 61.0 in | Wt 157.0 lb

## 2015-02-22 DIAGNOSIS — I739 Peripheral vascular disease, unspecified: Secondary | ICD-10-CM

## 2015-02-22 NOTE — Progress Notes (Signed)
Patient name: Brianna Powers MRN: [id number] DOB: Feb 03, 1966 Sex: female  REASON FOR VISIT: Follow-up of bilateral lower extremity ischemia and bilateral iliac stenting  HPI: Brianna Powers is a 49 y.o. female her today for follow-up of treatment of her severe ischemia of both feet. She had the tissue loss on the toes of her right foot and presented with unusual distal ischemia. Arteriogram revealed high-grade stenoses at her aortic bifurcation and possible thrombus present. She was taken to the operating room and underwent cutdowns to prevent embolus and went just underwent stenting of her common iliac arteries with kissing balloon technique. She did well and was discharged home. She's had the marked improvement in her feet. She did have peeling of the skin over the distal portions of her right toes. She does have some continued tingling but this is markedly improved  Current Outpatient Prescriptions  Medication Sig Dispense Refill  . ACCU-CHEK AVIVA PLUS test strip TEST twice a day 50 each PRN  . ACCU-CHEK SOFTCLIX LANCETS lancets TEST twice a day 100 each PRN  . clopidogrel (PLAVIX) 75 MG tablet Take 1 tablet (75 mg total) by mouth daily. 30 tablet 3  . gabapentin (NEURONTIN) 100 MG capsule Take 100 mg by mouth 2 (two) times daily as needed. pain  0  . HYDROcodone-acetaminophen (NORCO/VICODIN) 5-325 MG tablet Take 1 tablet by mouth every 4 (four) hours as needed for moderate pain. 30 tablet 0  . SYNTHROID 100 MCG tablet take 1 tablet by mouth once daily 30 tablet 1  . traMADol (ULTRAM) 50 MG tablet Take 1 tablet (50 mg total) by mouth every 6 (six) hours as needed for moderate pain. 30 tablet 0  . glucose monitoring kit (FREESTYLE) monitoring kit 1 each by Does not apply route as needed for other. Use as directed (Patient not taking: Reported on 02/22/2015) 1 each 0  . metFORMIN (GLUCOPHAGE) 500 MG tablet Take 500 mg by mouth 2 (two) times daily.  0   No current facility-administered  medications for this visit.    REVIEW OF SYSTEMS:  _0  denotes positive finding, _1  denotes negative finding Cardiac  Comments:  Chest pain or chest pressure:    Shortness of breath upon exertion:    Short of breath when lying flat:    Irregular heart rhythm:    Constitutional    Fever or chills:      PHYSICAL EXAM: Filed Vitals:   02/22/15 0827  BP: 113/81  Pulse: 95  Temp: 98.2 F (36.8 C)  TempSrc: Oral  Height: _2  (1.549 m)  Weight: 157 lb (71.215 kg)  SpO2: 100%    GENERAL: The patient is a well-nourished female, in no acute distress. The vital signs are documented above.  Her groin incisions are healing quite nicely. She has a 2+ right posterior tibial and 2+ left dorsalis pedis pulse  She did undergo CT of imaging several days ago and I discussed this with her after reviewing her films. This does show some irregularity in her in for inguinal aorta was present preoperatively. This is unchanged. She has no evidence of dissection. She does have widely patent iliac stents in her common iliac arteries bilaterally  Ankle arm indexes returned to normal  MEDICAL ISSUES: Stable overall. She will continue her usual activity. We will see her back in 3 months with repeat ankle arm indices and ongoing follow-up. She is released to return to full activity without limitation  Kadan Millstein [facility] [employer]  Beeper: [phone]

## 2015-02-22 NOTE — Addendum Note (Signed)
Addended by: Adria DillELDRIDGE-LEWIS, Rylyn Ranganathan L on: 02/22/2015 10:54 AM   Modules accepted: Orders

## 2015-03-01 ENCOUNTER — Encounter (HOSPITAL_COMMUNITY): Payer: Managed Care, Other (non HMO)

## 2015-03-01 ENCOUNTER — Encounter: Payer: Managed Care, Other (non HMO) | Admitting: Vascular Surgery

## 2015-05-13 ENCOUNTER — Encounter: Payer: Self-pay | Admitting: Vascular Surgery

## 2015-05-24 ENCOUNTER — Ambulatory Visit (INDEPENDENT_AMBULATORY_CARE_PROVIDER_SITE_OTHER): Payer: BLUE CROSS/BLUE SHIELD | Admitting: Vascular Surgery

## 2015-05-24 ENCOUNTER — Encounter: Payer: Self-pay | Admitting: Vascular Surgery

## 2015-05-24 ENCOUNTER — Ambulatory Visit (HOSPITAL_COMMUNITY)
Admission: RE | Admit: 2015-05-24 | Discharge: 2015-05-24 | Disposition: A | Discharge status: Home / Self Care | Payer: BLUE CROSS/BLUE SHIELD | Source: Ambulatory Visit | Attending: Vascular Surgery | Admitting: Vascular Surgery

## 2015-05-24 VITALS — BP 115/87 | HR 74 | Temp 97.6°F | Resp 16 | Ht 60.0 in | Wt 159.0 lb

## 2015-05-24 DIAGNOSIS — R0989 Other specified symptoms and signs involving the circulatory and respiratory systems: Secondary | ICD-10-CM | POA: Diagnosis present

## 2015-05-24 DIAGNOSIS — E119 Type 2 diabetes mellitus without complications: Secondary | ICD-10-CM | POA: Diagnosis not present

## 2015-05-24 DIAGNOSIS — I739 Peripheral vascular disease, unspecified: Secondary | ICD-10-CM

## 2015-05-24 NOTE — Addendum Note (Signed)
Addended by: Adria Dill L on: 05/24/2015 03:00 PM   Modules accepted: Orders
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
## 2015-05-25 NOTE — Addendum Note (Signed)
Addended by: Adria Dill L on: 05/25/2015 09:52 AM   Modules accepted: Orders

## 2015-11-16 ENCOUNTER — Encounter: Payer: Self-pay | Admitting: Family

## 2015-11-21 ENCOUNTER — Encounter (HOSPITAL_COMMUNITY): Payer: BLUE CROSS/BLUE SHIELD

## 2015-11-21 ENCOUNTER — Ambulatory Visit: Payer: BLUE CROSS/BLUE SHIELD | Admitting: Family

## 2016-02-01 ENCOUNTER — Encounter: Payer: Self-pay | Admitting: Family

## 2016-02-02 ENCOUNTER — Ambulatory Visit (INDEPENDENT_AMBULATORY_CARE_PROVIDER_SITE_OTHER): Payer: BLUE CROSS/BLUE SHIELD | Admitting: Family

## 2016-02-02 ENCOUNTER — Encounter: Payer: Self-pay | Admitting: Family

## 2016-02-02 ENCOUNTER — Ambulatory Visit (HOSPITAL_COMMUNITY)
Admission: RE | Admit: 2016-02-02 | Discharge: 2016-02-02 | Disposition: A | Discharge status: Home / Self Care | Payer: BLUE CROSS/BLUE SHIELD | Source: Ambulatory Visit | Attending: Family | Admitting: Family

## 2016-02-02 VITALS — BP 127/79 | HR 69 | Ht 60.0 in | Wt 166.0 lb

## 2016-02-02 DIAGNOSIS — Z9889 Other specified postprocedural states: Secondary | ICD-10-CM | POA: Diagnosis not present

## 2016-02-02 DIAGNOSIS — Z48812 Encounter for surgical aftercare following surgery on the circulatory system: Secondary | ICD-10-CM | POA: Diagnosis not present

## 2016-02-02 DIAGNOSIS — Z95820 Peripheral vascular angioplasty status with implants and grafts: Secondary | ICD-10-CM | POA: Insufficient documentation

## 2016-02-02 DIAGNOSIS — I771 Stricture of artery: Secondary | ICD-10-CM | POA: Diagnosis not present

## 2016-02-02 DIAGNOSIS — Z959 Presence of cardiac and vascular implant and graft, unspecified: Secondary | ICD-10-CM | POA: Diagnosis not present

## 2016-02-02 DIAGNOSIS — I739 Peripheral vascular disease, unspecified: Secondary | ICD-10-CM | POA: Diagnosis not present

## 2016-02-02 DIAGNOSIS — F17200 Nicotine dependence, unspecified, uncomplicated: Secondary | ICD-10-CM

## 2016-02-02 NOTE — Patient Instructions (Addendum)
Peripheral Vascular Disease Peripheral vascular disease (PVD) is a disease of the blood vessels that are not part of your heart and brain. A simple term for PVD is poor circulation. In most cases, PVD narrows the blood vessels that carry blood from your heart to the rest of your body. This can result in a decreased supply of blood to your arms, legs, and internal organs, like your stomach or kidneys. However, it most often affects a person's lower legs and feet. There are two types of PVD.  Organic PVD. This is the more common type. It is caused by damage to the structure of blood vessels.  Functional PVD. This is caused by conditions that make blood vessels contract and tighten (spasm). Without treatment, PVD tends to get worse over time. PVD can also lead to acute ischemic limb. This is when an arm or limb suddenly has trouble getting enough blood. This is a medical emergency. CAUSES Each type of PVD has many different causes. The most common cause of PVD is buildup of a fatty material (plaque) inside of your arteries (atherosclerosis). Small amounts of plaque can break off from the walls of the blood vessels and become lodged in a smaller artery. This blocks blood flow and can cause acute ischemic limb. Other common causes of PVD include:  Blood clots that form inside of blood vessels.  Injuries to blood vessels.  Diseases that cause inflammation of blood vessels or cause blood vessel spasms.  Health behaviors and health history that increase your risk of developing PVD. RISK FACTORS  You may have a greater risk of PVD if you:  Have a family history of PVD.  Have certain medical conditions, including:  High cholesterol.  Diabetes.  High blood pressure (hypertension).  Coronary heart disease.  Past problems with blood clots.  Past injury, such as burns or a broken bone. These may have damaged blood vessels in your limbs.  Buerger disease. This is caused by inflamed blood  vessels in your hands and feet.  Some forms of arthritis.  Rare birth defects that affect the arteries in your legs.  Use tobacco.  Do not get enough exercise.  Are obese.  Are age 50 or older. SIGNS AND SYMPTOMS  PVD may cause many different symptoms. Your symptoms depend on what part of your body is not getting enough blood. Some common signs and symptoms include:  Cramps in your lower legs. This may be a symptom of poor leg circulation (claudication).  Pain and weakness in your legs while you are physically active that goes away when you rest (intermittent claudication).  Leg pain when at rest.  Leg numbness, tingling, or weakness.  Coldness in a leg or foot, especially when compared with the other leg.  Skin or hair changes. These can include:  Hair loss.  Shiny skin.  Pale or bluish skin.  Thick toenails.  Inability to get or maintain an erection (erectile dysfunction). People with PVD are more prone to developing ulcers and sores on their toes, feet, or legs. These may take longer than normal to heal. DIAGNOSIS Your health care provider may diagnose PVD from your signs and symptoms. The health care provider will also do a physical exam. You may have tests to find out what is causing your PVD and determine its severity. Tests may include:  Blood pressure recordings from your arms and legs and measurements of the strength of your pulses (pulse volume recordings).  Imaging studies using sound waves to take pictures of   the blood flow through your blood vessels (Doppler ultrasound).  Injecting a dye into your blood vessels before having imaging studies using:  X-rays (angiogram or arteriogram).  Computer-generated X-rays (CT angiogram).  A powerful electromagnetic field and a computer (magnetic resonance angiogram or MRA). TREATMENT Treatment for PVD depends on the cause of your condition and the severity of your symptoms. It also depends on your age. Underlying  causes need to be treated and controlled. These include long-lasting (chronic) conditions, such as diabetes, high cholesterol, and high blood pressure. You may need to first try making lifestyle changes and taking medicines. Surgery may be needed if these do not work. Lifestyle changes may include:  Quitting smoking.  Exercising regularly.  Following a low-fat, low-cholesterol diet. Medicines may include:  Blood thinners to prevent blood clots.  Medicines to improve blood flow.  Medicines to improve your blood cholesterol levels. Surgical procedures may include:  A procedure that uses an inflated balloon to open a blocked artery and improve blood flow (angioplasty).  A procedure to put in a tube (stent) to keep a blocked artery open (stent implant).  Surgery to reroute blood flow around a blocked artery (peripheral bypass surgery).  Surgery to remove dead tissue from an infected wound on the affected limb.  Amputation. This is surgical removal of the affected limb. This may be necessary in cases of acute ischemic limb that are not improved through medical or surgical treatments. HOME CARE INSTRUCTIONS  Take medicines only as directed by your health care provider.  Do not use any tobacco products, including cigarettes, chewing tobacco, or electronic cigarettes. If you need help quitting, ask your health care provider.  Lose weight if you are overweight, and maintain a healthy weight as directed by your health care provider.  Eat a diet that is low in fat and cholesterol. If you need help, ask your health care provider.  Exercise regularly. Ask your health care provider to suggest some good activities for you.  Use compression stockings or other mechanical devices as directed by your health care provider.  Take good care of your feet.  Wear comfortable shoes that fit well.  Check your feet often for any cuts or sores. SEEK MEDICAL CARE IF:  You have cramps in your legs  while walking.  You have leg pain when you are at rest.  You have coldness in a leg or foot.  Your skin changes.  You have erectile dysfunction.  You have cuts or sores on your feet that are not healing. SEEK IMMEDIATE MEDICAL CARE IF:  Your arm or leg turns cold and blue.  Your arms or legs become red, warm, swollen, painful, or numb.  You have chest pain or trouble breathing.  You suddenly have weakness in your face, arm, or leg.  You become very confused or lose the ability to speak.  You suddenly have a very bad headache or lose your vision.   This information is not intended to replace advice given to you by your health care provider. Make sure you discuss any questions you have with your health care provider.   Document Released: 04/19/2004 Document Revised: 04/02/2014 Document Reviewed: 08/20/2013 Elsevier Interactive Patient Education 2016 Elsevier Inc.     Steps to Quit Smoking  Smoking tobacco can be harmful to your health and can affect almost every organ in your body. Smoking puts you, and those around you, at risk for developing many serious chronic diseases. Quitting smoking is difficult, but it is one of   the best things that you can do for your health. It is never too late to quit. WHAT ARE THE BENEFITS OF QUITTING SMOKING? When you quit smoking, you lower your risk of developing serious diseases and conditions, such as:  Lung cancer or lung disease, such as COPD.  Heart disease.  Stroke.  Heart attack.  Infertility.  Osteoporosis and bone fractures. Additionally, symptoms such as coughing, wheezing, and shortness of breath may get better when you quit. You may also find that you get sick less often because your body is stronger at fighting off colds and infections. If you are pregnant, quitting smoking can help to reduce your chances of having a baby of low birth weight. HOW DO I GET READY TO QUIT? When you decide to quit smoking, create a plan to  make sure that you are successful. Before you quit:  Pick a date to quit. Set a date within the next two weeks to give you time to prepare.  Write down the reasons why you are quitting. Keep this list in places where you will see it often, such as on your bathroom mirror or in your car or wallet.  Identify the people, places, things, and activities that make you want to smoke (triggers) and avoid them. Make sure to take these actions:  Throw away all cigarettes at home, at work, and in your car.  Throw away smoking accessories, such as ashtrays and lighters.  Clean your car and make sure to empty the ashtray.  Clean your home, including curtains and carpets.  Tell your family, friends, and coworkers that you are quitting. Support from your loved ones can make quitting easier.  Talk with your health care provider about your options for quitting smoking.  Find out what treatment options are covered by your health insurance. WHAT STRATEGIES CAN I USE TO QUIT SMOKING?  Talk with your healthcare provider about different strategies to quit smoking. Some strategies include:  Quitting smoking altogether instead of gradually lessening how much you smoke over a period of time. Research shows that quitting "cold turkey" is more successful than gradually quitting.  Attending in-person counseling to help you build problem-solving skills. You are more likely to have success in quitting if you attend several counseling sessions. Even short sessions of 10 minutes can be effective.  Finding resources and support systems that can help you to quit smoking and remain smoke-free after you quit. These resources are most helpful when you use them often. They can include:  Online chats with a counselor.  Telephone quitlines.  Printed self-help materials.  Support groups or group counseling.  Text messaging programs.  Mobile phone applications.  Taking medicines to help you quit smoking. (If you are  pregnant or breastfeeding, talk with your health care provider first.) Some medicines contain nicotine and some do not. Both types of medicines help with cravings, but the medicines that include nicotine help to relieve withdrawal symptoms. Your health care provider may recommend:  Nicotine patches, gum, or lozenges.  Nicotine inhalers or sprays.  Non-nicotine medicine that is taken by mouth. Talk with your health care provider about combining strategies, such as taking medicines while you are also receiving in-person counseling. Using these two strategies together makes you more likely to succeed in quitting than if you used either strategy on its own. If you are pregnant or breastfeeding, talk with your health care provider about finding counseling or other support strategies to quit smoking. Do not take medicine to help you   quit smoking unless told to do so by your health care provider. WHAT THINGS CAN I DO TO MAKE IT EASIER TO QUIT? Quitting smoking might feel overwhelming at first, but there is a lot that you can do to make it easier. Take these important actions:  Reach out to your family and friends and ask that they support and encourage you during this time. Call telephone quitlines, reach out to support groups, or work with a counselor for support.  Ask people who smoke to avoid smoking around you.  Avoid places that trigger you to smoke, such as bars, parties, or smoke-break areas at work.  Spend time around people who do not smoke.  Lessen stress in your life, because stress can be a smoking trigger for some people. To lessen stress, try:  Exercising regularly.  Deep-breathing exercises.  Yoga.  Meditating.  Performing a body scan. This involves closing your eyes, scanning your body from head to toe, and noticing which parts of your body are particularly tense. Purposefully relax the muscles in those areas.  Download or purchase mobile phone or tablet apps (applications)  that can help you stick to your quit plan by providing reminders, tips, and encouragement. There are many free apps, such as QuitGuide from the CDC (Centers for Disease Control and Prevention). You can find other support for quitting smoking (smoking cessation) through smokefree.gov and other websites. HOW WILL I FEEL WHEN I QUIT SMOKING? Within the first 24 hours of quitting smoking, you may start to feel some withdrawal symptoms. These symptoms are usually most noticeable 2-3 days after quitting, but they usually do not last beyond 2-3 weeks. Changes or symptoms that you might experience include:  Mood swings.  Restlessness, anxiety, or irritation.  Difficulty concentrating.  Dizziness.  Strong cravings for sugary foods in addition to nicotine.  Mild weight gain.  Constipation.  Nausea.  Coughing or a sore throat.  Changes in how your medicines work in your body.  A depressed mood.  Difficulty sleeping (insomnia). After the first 2-3 weeks of quitting, you may start to notice more positive results, such as:  Improved sense of smell and taste.  Decreased coughing and sore throat.  Slower heart rate.  Lower blood pressure.  Clearer skin.  The ability to breathe more easily.  Fewer sick days. Quitting smoking is very challenging for most people. Do not get discouraged if you are not successful the first time. Some people need to make many attempts to quit before they achieve long-term success. Do your best to stick to your quit plan, and talk with your health care provider if you have any questions or concerns.   This information is not intended to replace advice given to you by your health care provider. Make sure you discuss any questions you have with your health care provider.   Document Released: 03/06/2001 Document Revised: 07/27/2014 Document Reviewed: 07/27/2014 Elsevier Interactive Patient Education 2016 Elsevier Inc.  

## 2016-02-02 NOTE — Progress Notes (Signed)
[facility] [place]   CC: Follow up peripheral artery occlusive disease  History of Present Illness Brianna Powers is a 50 y.o. femalepatient of Dr. Donnetta Hutching who is s/p bilateral common iliac artery angioplasty with femoral cutdown on 01/28/2015. She presented with ischemic changes to the toes to both feet. Workup revealed a high-grade stenosis at the bifurcation of her aorta with probable embolus to her feet. It was unknown whether this was thrombus or plaque.  She has had complete resolution of the 2 skin changes.    She had done well regarding discontinuation of smoking which she understands that is critical. She is under a lot of emotional stress with the recent death of her father after prolonged illness in January and also a recent fall with injury from her mother.  She is struggling with trying to lose weight.  She denies claudication symptoms with walking, denies non healing wounds.  She walks 10-20 minutes daily.  She denies any hx of stroke or TIA.  Pt Diabetic: Yes, last A1C result on file was 6.2 in November 2016 Pt smoker: quit in 2016, resumed recently  Pt meds include: Statin :No Betablocker: No ASA: No Other anticoagulants/antiplatelets: Plavix  Past Medical History:  Diagnosis Date  . Diabetes mellitus without complication ([place])   . Hypothyroidism   . Peripheral vascular disease [facility])     Social History Social History  Substance Use Topics  . Smoking status: Former Smoker    Quit date: 05/25/2014  . Smokeless tobacco: Never Used  . Alcohol use No    Family History Family History  Problem Relation Age of Onset  . CAD Father   . Diabetes type II Father     Past Surgical History:  Procedure Laterality Date  . FEMORAL ARTERY EXPLORATION Bilateral 01/28/2015   Procedure: BILATERAL FEMORAL ARTERY EXPLORATION;  Surgeon: Rosetta Posner, MD;  Location: [place];  Service: Vascular;  Laterality: Bilateral;  . INSERTION OF ILIAC STENT  Bilateral 01/28/2015   Procedure: INSERTION OF RIGHT AND LEFT ILIAC STENT;  Surgeon: Rosetta Posner, MD;  Location: [place];  Service: Vascular;  Laterality: Bilateral;  . PERIPHERAL VASCULAR CATHETERIZATION N/A 01/26/2015   Procedure: Abdominal Aortogram;  Surgeon: Rosetta Posner, MD;  Location: [place] [facility];  Service: Cardiovascular;  Laterality: N/A;    No Known Allergies  Current Outpatient Prescriptions  Medication Sig Dispense Refill  . clopidogrel (PLAVIX) 75 MG tablet Take 1 tablet (75 mg total) by mouth daily. 30 tablet 3  . metFORMIN (GLUCOPHAGE) 500 MG tablet Take 500 mg by mouth 2 (two) times daily.  0  . SYNTHROID 100 MCG tablet take 1 tablet by mouth once daily 30 tablet 1  . ACCU-CHEK AVIVA PLUS test strip TEST twice a day (Patient not taking: Reported on 02/02/2016) 50 each PRN  . ACCU-CHEK SOFTCLIX LANCETS lancets TEST twice a day (Patient not taking: Reported on 02/02/2016) 100 each PRN  . gabapentin (NEURONTIN) 100 MG capsule Take 100 mg by mouth 2 (two) times daily as needed. Reported on 05/24/2015  0  . glucose monitoring kit (FREESTYLE) monitoring kit 1 each by Does not apply route as needed for other. Use as directed (Patient not taking: Reported on 02/02/2016) 1 each 0  . HYDROcodone-acetaminophen (NORCO/VICODIN) 5-325 MG tablet Take 1 tablet by mouth every 4 (four) hours as needed for moderate pain. (Patient not taking: Reported on 02/02/2016) 30 tablet 0  . traMADol (ULTRAM) 50 MG tablet Take 1 tablet (50  mg total) by mouth every 6 (six) hours as needed for moderate pain. (Patient not taking: Reported on 02/02/2016) 30 tablet 0   No current facility-administered medications for this visit.     ROS: See HPI for pertinent positives and negatives.   Physical Examination  Vitals:   02/02/16 1059 02/02/16 1116  BP: 121/87 127/79  Pulse: 68 69  SpO2: 98%   Weight: 166 lb (75.3 kg)   Height: 5' (1.524 m)    Body mass index is 32.42 kg/m.  General: A&O x 3, WDWN,  obese female. Gait: normal Eyes: PERRLA. Pulmonary: Respirations are non labored, CTAB, good air movement Cardiac: regular rhythm, no detected murmur.         Carotid Bruits Right Left   Negative Negative  Aorta is not palpable. Radial pulses: 2+ palpable and =                           VASCULAR EXAM: Extremities without ischemic changes, without Gangrene; without open wounds.                                                                                                          LE Pulses Right Left       FEMORAL  not palpable  1+ palpable        POPLITEAL  not palpable   not palpable       POSTERIOR TIBIAL  2+ palpable   not palpable        DORSALIS PEDIS      ANTERIOR TIBIAL not palpable  faintly palpable    Abdomen: soft, NT, no palpable masses. Skin: no rashes, no ulcers. Musculoskeletal: no muscle wasting or atrophy.  Neurologic: A&O X 3; Appropriate Affect ; SENSATION: normal; MOTOR FUNCTION:  moving all extremities equally, motor strength 5/5 throughout. Speech is fluent/normal. CN 2-12 intact.    ASSESSMENT: Brianna Powers is a 50 y.o. female who is s/p bilateral common iliac artery angioplasty with femoral cutdown on 01/28/2015. She no longer has claudication symptoms with walking. She walks about 15-20 minutes daily. Her barriers to walking are her work scheduled and caring for her mother.  Her atherosclerotic risk factors include currently controlled DM, active smoking, and obesity.   DATA Aortoiliac duplex today with decreased visualization of the abdominal vasculature due to overlying bowel gas and body habitus. Internal iliac artery no visualized. Bilateral CIA stent with no significant stenosis, mostly triphasic, some biphasic waveforms.  Bilateral ABI's remain normal with biphasic waveforms on the right and triphasic on the left. Right TBI is almost normal, left is normal.   PLAN:  Graduated waling program discussed and how to achieve.  The  patient was counseled re smoking cessation and given several free resources re smoking cessation.  Based on the patient's vascular studies and examination, pt will return to clinic in 6 months with ABI's and bilateral aortoiliac arterial duplex.   I discussed in depth with the patient the nature of atherosclerosis, and emphasized the importance of maximal medical  management including strict control of blood pressure, blood glucose, and lipid levels, obtaining regular exercise, and cessation of smoking.  The patient is aware that without maximal medical management the underlying atherosclerotic disease process will progress, limiting the benefit of any interventions.  The patient was given information about PAD including signs, symptoms, treatment, what symptoms should prompt the patient to seek immediate medical care, and risk reduction measures to take.  Clemon Chambers, RN, MSN, FNP-C [facility] Phone: [phone]  Clinic MD: [facility]  02/02/16 11:44 AM

## 2016-02-06 NOTE — Addendum Note (Signed)
Addended by: Melodye PedMANESS-HARRISON, CHANDA C on: 02/06/2016 04:05 PM   Modules accepted: Orders

## 2016-02-10 DIAGNOSIS — E6609 Other obesity due to excess calories: Secondary | ICD-10-CM | POA: Diagnosis not present

## 2016-02-10 DIAGNOSIS — Z713 Dietary counseling and surveillance: Secondary | ICD-10-CM | POA: Diagnosis not present

## 2016-02-10 DIAGNOSIS — Z720 Tobacco use: Secondary | ICD-10-CM | POA: Diagnosis not present

## 2016-02-28 ENCOUNTER — Encounter: Payer: Self-pay | Admitting: Family

## 2016-02-28 ENCOUNTER — Telehealth: Payer: Self-pay

## 2016-02-28 NOTE — Telephone Encounter (Signed)
Spoke to pt to sch appt 02/29/16 at 10:15.

## 2016-02-28 NOTE — Telephone Encounter (Signed)
Phone call from pt.  Reported approx. 1 week hx of 3rd and 4th toes, of right foot, turning blue.  Stated there is a "soreness" in the toes.  Also, reported she can move her toes, but that they are "stiff."  Stated her "feet are a little cool."  Denied any open sores of right lower extremity.  Denied leg pain.  Stated when she takes her Plavix the color improves somewhat, but intermittently becomes more discolored.  Discussed with Dr. Arbie CookeyEarly.  Recommended to schedule appt. With NP this week; no studies at this time, since vascular studies were just completed on 02/02/16.  Will have a Scheduler contact the pt. With appt. info.

## 2016-02-29 ENCOUNTER — Ambulatory Visit (INDEPENDENT_AMBULATORY_CARE_PROVIDER_SITE_OTHER): Payer: BLUE CROSS/BLUE SHIELD | Admitting: Family

## 2016-02-29 ENCOUNTER — Encounter: Payer: Self-pay | Admitting: Family

## 2016-02-29 VITALS — BP 116/62 | HR 72 | Temp 97.3°F | Ht 60.0 in | Wt 162.0 lb

## 2016-02-29 DIAGNOSIS — Z87891 Personal history of nicotine dependence: Secondary | ICD-10-CM | POA: Diagnosis not present

## 2016-02-29 DIAGNOSIS — Z959 Presence of cardiac and vascular implant and graft, unspecified: Secondary | ICD-10-CM

## 2016-02-29 DIAGNOSIS — I771 Stricture of artery: Secondary | ICD-10-CM | POA: Diagnosis not present

## 2016-02-29 NOTE — Progress Notes (Signed)
[facility] [place]   CC: Follow up peripheral artery occlusive disease  History of Present Illness Brianna Powers is a 50 y.o. female femalepatient of Dr. Donnetta Hutching who is s/p bilateral common iliac artery angioplasty with femoral cutdown on 01/28/2015. She presented with ischemic changes to the toes of both feet. Workup revealed a high-grade stenosis at the bifurcation of her aorta with probable embolus to her feet. It was unknown whether this was thrombus or plaque.  She had complete resolution of the 2 skin changes.   She returns today after phone call from pt yesterday.  Reported approx. 1 week hx of right  2nd and 3rd toe tips turning blue.  Stated there is a "soreness" in the toes.  Also, reported she can move her toes, but that they are "stiff."  Stated her "feet are a little cool."  Denied any open sores of right lower extremity.  Stated when she takes her Plavix the color improves somewhat, but intermittently becomes more discolored. She admits to not taking any of her medications for a while when she was moving her mother out of her mother's house.   She had done well regarding discontinuation of smoking which she understands that is critical. She is under a lot of emotional stress with the recent death of her father after prolonged illness in January and also a recent fall with injury from her mother.  She is struggling with trying to lose weight.  She denies claudication symptoms with walking, denies non healing wounds.  She walks 10-20 minutes daily.  She denies any hx of stroke or TIA.  Pt Diabetic: Yes, last A1C result on file was 6.2 in November 2016 Pt smoker: quit in October 2017  Pt meds include: Statin :No Betablocker: No ASA: No Other anticoagulants/antiplatelets: Plavix    Past Medical History:  Diagnosis Date  . Diabetes mellitus without complication ([place])   . Hypothyroidism   . Peripheral vascular disease [facility])     Social  History Social History  Substance Use Topics  . Smoking status: Former Smoker    Quit date: 05/25/2014  . Smokeless tobacco: Never Used  . Alcohol use No    Family History Family History  Problem Relation Age of Onset  . CAD Father   . Diabetes type II Father     Past Surgical History:  Procedure Laterality Date  . FEMORAL ARTERY EXPLORATION Bilateral 01/28/2015   Procedure: BILATERAL FEMORAL ARTERY EXPLORATION;  Surgeon: Rosetta Posner, MD;  Location: [place];  Service: Vascular;  Laterality: Bilateral;  . INSERTION OF ILIAC STENT Bilateral 01/28/2015   Procedure: INSERTION OF RIGHT AND LEFT ILIAC STENT;  Surgeon: Rosetta Posner, MD;  Location: [place];  Service: Vascular;  Laterality: Bilateral;  . PERIPHERAL VASCULAR CATHETERIZATION N/A 01/26/2015   Procedure: Abdominal Aortogram;  Surgeon: Rosetta Posner, MD;  Location: [place] [facility];  Service: Cardiovascular;  Laterality: N/A;    No Known Allergies  Current Outpatient Prescriptions  Medication Sig Dispense Refill  . ACCU-CHEK AVIVA PLUS test strip TEST twice a day 50 each PRN  . ACCU-CHEK SOFTCLIX LANCETS lancets TEST twice a day 100 each PRN  . clopidogrel (PLAVIX) 75 MG tablet Take 1 tablet (75 mg total) by mouth daily. 30 tablet 3  . glucose monitoring kit (FREESTYLE) monitoring kit 1 each by Does not apply route as needed for other. Use as directed 1 each 0  . metFORMIN (GLUCOPHAGE) 500 MG tablet Take 500 mg by  mouth 2 (two) times daily.  0  . Multiple Vitamin (MULTIVITAMIN) tablet Take 1 tablet by mouth daily.    Marland Kitchen SYNTHROID 100 MCG tablet take 1 tablet by mouth once daily 30 tablet 1  . gabapentin (NEURONTIN) 100 MG capsule Take 100 mg by mouth 2 (two) times daily as needed. Reported on 05/24/2015  0  . HYDROcodone-acetaminophen (NORCO/VICODIN) 5-325 MG tablet Take 1 tablet by mouth every 4 (four) hours as needed for moderate pain. (Patient not taking: Reported on 02/29/2016) 30 tablet 0  . phentermine 15 MG capsule Take 15 mg  by mouth daily.  0  . traMADol (ULTRAM) 50 MG tablet Take 1 tablet (50 mg total) by mouth every 6 (six) hours as needed for moderate pain. (Patient not taking: Reported on 02/29/2016) 30 tablet 0   No current facility-administered medications for this visit.     ROS: See HPI for pertinent positives and negatives.   Physical Examination  Vitals:   02/29/16 1010 02/29/16 1018  BP: 122/72 116/62  Pulse: 72   Temp: 97.3 F (36.3 C)   TempSrc: Oral   SpO2: 97%   Weight: 162 lb (73.5 kg)   Height: 5' (1.524 m)    Body mass index is 31.64 kg/m.  General: A&O x 3, WDWN, obese female. Gait: normal Eyes: PERRLA. Pulmonary: Respirations are non labored, CTAB, good air movement Cardiac: regular rhythm, no detected murmur.         Carotid Bruits Right Left   Negative Negative  Aorta is not palpable. Radial pulses: not palpable and,  + brisk Doppler signal in both radial arteries                    VASCULAR EXAM: Extremities without ischemic changes, without Gangrene; without open wounds. Tips of right 2nd and 3rd toes are moderately cyanotic.                                                                                                                                                        LE Pulses Right Left       FEMORAL  not palpable  1+ palpable       POPLITEAL  not palpable  not palpable       POSTERIOR TIBIAL  not palpable, + brisk Doppler signal  not palpable,  + brisk Dopplersignal       DORSALIS PEDIS      ANTERIOR TIBIAL Not palpable,  + brisk Doppler signal Not palpable,  + brisk Doppler signal   Abdomen: soft, NT, no palpable masses. Skin: no rashes, no ulcers. Musculoskeletal: no muscle wasting or atrophy.         Neurologic: A&O X 3; Appropriate Affect ; SENSATION: normal; MOTOR FUNCTION:  moving all extremities equally, motor strength 5/5 throughout. Speech is fluent/normal. CN  2-12 intact.    ASSESSMENT: Brianna Powers is a 50 y.o. female who is s/p  bilateral common iliac artery angioplasty with femoral cutdown on 01/28/2015. She no longer has claudication symptoms with walking. She walks about 15-20 minutes daily. Her barriers to walking are her work scheduled and caring for her mother.  She returns with a 1 weeks hx of the tips of right 2nd and 3rd toe tips turing blue until she takes her Plavix that day and the normal pink color returns. She admits to not taking care of her self including not taking any of her medications while she was moving her mother out of her mother's house. She has since resumed taking all of her medications and better care of herself. Bilateral pedal pulses are not palpable but have brisk Doppler signals in bilateral DP and PT, and brisk arterial Doppler signals at the base of right 2nd and 3rd toes.  Tips of right 2nd and 3rd toes are moderately cyanotic.    Her atherosclerotic risk factors include currently controlled DM, former smoking, and obesity.   DATA Aortoiliac duplex on 02-02-16 with decreased visualization of the abdominal vasculature due to overlying bowel gas and body habitus. Internal iliac artery not visualized. Bilateral CIA stent with no significant stenosis, mostly triphasic, some biphasic waveforms.  Bilateral ABI's remained normal with biphasic waveforms on the right and triphasic on the left. Right TBI was almost normal, left was normal.    PLAN:  Based on the patient's vascular studies and examination, and after discussing with Dr. Scot Dock, pt will return to clinic in 6 months from her November 2017 visit with ABI's and bilateral aortoiliac arterial duplex. I advised pt to notify us if the discoloration in her toes worsens, or other concerns re the circulation in her feet/legs.   I discussed in depth with the patient the nature of atherosclerosis, and emphasized the importance of maximal medical management including strict control of blood pressure, blood glucose, and lipid levels, obtaining  regular exercise, and continued cessation of smoking.  The patient is aware that without maximal medical management the underlying atherosclerotic disease process will progress, limiting the benefit of any interventions.  The patient was given information about PAD including signs, symptoms, treatment, what symptoms should prompt the patient to seek immediate medical care, and risk reduction measures to take.  Clemon Chambers, RN, MSN, FNP-C [facility] [employer] Phone: [phone]  Clinic MD: Scot Dock  02/29/16 10:52 AM

## 2016-02-29 NOTE — Patient Instructions (Signed)

## 2016-03-02 DIAGNOSIS — Z0001 Encounter for general adult medical examination with abnormal findings: Secondary | ICD-10-CM | POA: Diagnosis not present

## 2016-03-02 DIAGNOSIS — I739 Peripheral vascular disease, unspecified: Secondary | ICD-10-CM | POA: Diagnosis not present

## 2016-03-02 DIAGNOSIS — E785 Hyperlipidemia, unspecified: Secondary | ICD-10-CM | POA: Diagnosis not present

## 2016-03-02 DIAGNOSIS — R7301 Impaired fasting glucose: Secondary | ICD-10-CM | POA: Diagnosis not present

## 2016-04-14 DIAGNOSIS — R7301 Impaired fasting glucose: Secondary | ICD-10-CM | POA: Diagnosis not present

## 2016-04-14 DIAGNOSIS — I739 Peripheral vascular disease, unspecified: Secondary | ICD-10-CM | POA: Diagnosis not present

## 2016-04-16 ENCOUNTER — Telehealth: Payer: Self-pay

## 2016-04-16 DIAGNOSIS — L819 Disorder of pigmentation, unspecified: Secondary | ICD-10-CM

## 2016-04-16 DIAGNOSIS — I739 Peripheral vascular disease, unspecified: Secondary | ICD-10-CM

## 2016-04-16 DIAGNOSIS — Z95828 Presence of other vascular implants and grafts: Secondary | ICD-10-CM

## 2016-04-16 NOTE — Telephone Encounter (Signed)
Yes ma'am she is aware

## 2016-04-16 NOTE — Telephone Encounter (Signed)
Phone call from pt.  Reported about 4 day hx of onset of heaviness in bilat LE's with walking.  Stated "I walk so far, and need to rest."  Stated it starts out with a pain in the right hip and thigh that then becomes numb.  Reported she also gets occasional numbness in left foot. Reported toes of right foot # 3,4 and 5 have an intermittent blue discoloration, and if she touches them, they turn red.  Stated the skin on top of right foot gets "blotchy", at times.  Denied open sore of LE's.  Reported the (R) 3rd toe has a red, irritated cuticle, but she thinks it is getting better.  Denied fever or chills.  Advised will discuss with MD and call pt. back with appt. recommendation.  Agreed.

## 2016-04-16 NOTE — Telephone Encounter (Signed)
Discussed symptoms with Dr. Myra GianottiBrabham.  Advised to schedule for Aorto-Iliac Duplex and ABI's and appt. With Dr. Arbie CookeyEarly or PA tomorrow.  Will have Scheduler contact pt.

## 2016-04-16 NOTE — Telephone Encounter (Signed)
Pt is to be here tomorrow and see NP at 9:45 for ABI only work in

## 2016-04-17 ENCOUNTER — Ambulatory Visit: Payer: BLUE CROSS/BLUE SHIELD | Admitting: Vascular Surgery

## 2016-04-17 ENCOUNTER — Other Ambulatory Visit: Payer: Self-pay

## 2016-04-17 ENCOUNTER — Other Ambulatory Visit: Payer: Self-pay | Admitting: *Deleted

## 2016-04-17 ENCOUNTER — Ambulatory Visit (INDEPENDENT_AMBULATORY_CARE_PROVIDER_SITE_OTHER): Payer: BLUE CROSS/BLUE SHIELD | Admitting: Family

## 2016-04-17 ENCOUNTER — Encounter: Payer: Self-pay | Admitting: Family

## 2016-04-17 ENCOUNTER — Ambulatory Visit (HOSPITAL_COMMUNITY)
Admission: RE | Admit: 2016-04-17 | Discharge: 2016-04-17 | Disposition: A | Discharge status: Home / Self Care | Payer: BLUE CROSS/BLUE SHIELD | Source: Ambulatory Visit | Attending: Family | Admitting: Family

## 2016-04-17 VITALS — BP 120/90 | HR 73 | Temp 97.2°F | Resp 18 | Ht 60.0 in | Wt 159.0 lb

## 2016-04-17 DIAGNOSIS — M79606 Pain in leg, unspecified: Secondary | ICD-10-CM

## 2016-04-17 DIAGNOSIS — I739 Peripheral vascular disease, unspecified: Secondary | ICD-10-CM | POA: Insufficient documentation

## 2016-04-17 DIAGNOSIS — Z959 Presence of cardiac and vascular implant and graft, unspecified: Secondary | ICD-10-CM | POA: Diagnosis not present

## 2016-04-17 DIAGNOSIS — I771 Stricture of artery: Secondary | ICD-10-CM | POA: Diagnosis not present

## 2016-04-17 DIAGNOSIS — E119 Type 2 diabetes mellitus without complications: Secondary | ICD-10-CM | POA: Diagnosis not present

## 2016-04-17 DIAGNOSIS — R230 Cyanosis: Secondary | ICD-10-CM

## 2016-04-17 DIAGNOSIS — R7301 Impaired fasting glucose: Secondary | ICD-10-CM | POA: Diagnosis not present

## 2016-04-17 DIAGNOSIS — Z87891 Personal history of nicotine dependence: Secondary | ICD-10-CM

## 2016-04-17 DIAGNOSIS — E039 Hypothyroidism, unspecified: Secondary | ICD-10-CM | POA: Diagnosis not present

## 2016-04-17 NOTE — Patient Instructions (Signed)

## 2016-04-17 NOTE — Progress Notes (Signed)
[facility] OF [place]   CC: Follow up peripheral artery occlusive disease  History of Present Illness Brianna Powers is a 51 y.o. female female patient of Dr. Donnetta Hutching who is s/p bilateral common iliac artery angioplasty with femoral cutdown on 01/28/2015. She presented with ischemic changes to the toes of both feet. Workup revealed a high-grade stenosis at the bifurcation of her aorta with probable embolus to her feet. It was unknown whether this was thrombus or plaque.  She had complete resolution of both feet changes.   She returns today with c/o both legs feeling heavy since 04-12-16 (5 days hx) after walking about 50 feet, right more so than left leg, resolves with rest.  Numbness and pain in both feet is intermittent. Right 3rd and 4th toe tips are cyanotic, left 2nd and 5th toe tips are cyanotic.   She has done well regarding discontinuation of smoking which she understands is critical. She is under a lot of emotional stress with the recent death of her father after prolonged illness and also a recent fall with injury from her mother.  She is struggling with trying to lose weight.  She denies non healing wounds.  She walks 10-20 minutes daily.  She denies any hx of stroke or TIA.  Pt Diabetic: Yes, last A1C result on file was 6.2 in November 2016 Pt smoker: quit in November 2017  Pt meds include: Statin :No Betablocker: No ASA: No Other anticoagulants/antiplatelets: Plavix     Past Medical History:  Diagnosis Date  . Diabetes mellitus without complication ([place])   . Hypothyroidism   . Peripheral vascular disease [facility])     Social History Social History  Substance Use Topics  . Smoking status: Former Smoker    Quit date: 05/25/2014  . Smokeless tobacco: Never Used  . Alcohol use No    Family History Family History  Problem Relation Age of Onset  . CAD Father   . Diabetes type II Father     Past Surgical History:  Procedure  Laterality Date  . FEMORAL ARTERY EXPLORATION Bilateral 01/28/2015   Procedure: BILATERAL FEMORAL ARTERY EXPLORATION;  Surgeon: Rosetta Posner, MD;  Location: [place];  Service: Vascular;  Laterality: Bilateral;  . INSERTION OF ILIAC STENT Bilateral 01/28/2015   Procedure: INSERTION OF RIGHT AND LEFT ILIAC STENT;  Surgeon: Rosetta Posner, MD;  Location: [place];  Service: Vascular;  Laterality: Bilateral;  . PERIPHERAL VASCULAR CATHETERIZATION N/A 01/26/2015   Procedure: Abdominal Aortogram;  Surgeon: Rosetta Posner, MD;  Location: [place] [facility];  Service: Cardiovascular;  Laterality: N/A;    Allergies  Allergen Reactions  . Amoxicillin Rash    Panic attacks.     Current Outpatient Prescriptions  Medication Sig Dispense Refill  . ACCU-CHEK AVIVA PLUS test strip TEST twice a day 50 each PRN  . ACCU-CHEK SOFTCLIX LANCETS lancets TEST twice a day 100 each PRN  . clopidogrel (PLAVIX) 75 MG tablet Take 1 tablet (75 mg total) by mouth daily. 30 tablet 3  . gabapentin (NEURONTIN) 100 MG capsule Take 100 mg by mouth 2 (two) times daily as needed. Reported on 05/24/2015  0  . glucose monitoring kit (FREESTYLE) monitoring kit 1 each by Does not apply route as needed for other. Use as directed 1 each 0  . metFORMIN (GLUCOPHAGE) 500 MG tablet Take 500 mg by mouth 2 (two) times daily.  0  . Multiple Vitamin (MULTIVITAMIN) tablet Take 1 tablet by mouth daily.    Marland Kitchen  SYNTHROID 100 MCG tablet take 1 tablet by mouth once daily 30 tablet 1  . phentermine 15 MG capsule Take 15 mg by mouth daily.  0  . traMADol (ULTRAM) 50 MG tablet Take 1 tablet (50 mg total) by mouth every 6 (six) hours as needed for moderate pain. (Patient not taking: Reported on 04/17/2016) 30 tablet 0   No current facility-administered medications for this visit.     ROS: See HPI for pertinent positives and negatives.   Physical Examination  Vitals:   04/17/16 1022  BP: 120/90  Pulse: 73  Resp: 18  Temp: 97.2 F (36.2 C)  TempSrc:  Oral  SpO2: 95%  Weight: 159 lb (72.1 kg)  Height: 5' (1.524 m)   Body mass index is 31.05 kg/m.  General: A&O x 3, WDWN, obese female. Gait: normal Eyes: PERRLA. Pulmonary: Respirations are non labored, CTAB, good air movement Cardiac: regular rhythm, no detected murmur.    Carotid Bruits Right Left   Negative Negative  Aorta is notpalpable. Radial pulses: not palpable and,  + brisk Doppler signal in both radial arteries  VASCULAR EXAM: Extremitieswithischemic changes: Right 3rd and 4th toe tips are cyanotic, left 2nd and 5th toe tips are cyanotic.    withoutGangrene; withoutopen wounds.   LE Pulses Right Left  FEMORAL notpalpable notpalpable  POPLITEAL notpalpable notpalpable  POSTERIOR TIBIAL Not palpable notpalpable  DORSALIS PEDIS ANTERIOR TIBIAL Notpalpable Notpalpable   Abdomen: soft, NT, no palpable masses. Skin: no rashes, no ulcers, see Extremities. Musculoskeletal: no muscle wasting or atrophy. Neurologic: A&O X 3; Appropriate Affect ; SENSATION: normal; MOTOR FUNCTION: moving all extremities equally, motor strength 4/5 throughout. Speech is fluent/normal. CN 2-12 intact.    ASSESSMENT: Brianna Powers is a 51 y.o. female who is s/p bilateral common iliac artery angioplasty with femoral cutdown on 01/28/2015. Both legs feel heavy since 04-12-16 (5 days hx) after walking about 50 feet, right more so than left leg, resolves with rest.  Numbness and pain in both feet is intermittent. Right 3rd and 4th toe tips are cyanotic, left 2nd and 5th toe tips are cyanotic.   Her atherosclerotic risk factors include currently controlled DM, former smoking, and obesity.   Dr. Donnetta Hutching spoke with and examined pt.  DATA Aortoiliac duplex on 02-02-16 with decreased visualization of the abdominal vasculature due to overlying bowel gas and body habitus. Internal iliac artery not  visualized. Bilateral CIA stent with no significant stenosis, mostly triphasic, some biphasic waveforms.  Bilateral ABI's remained normal with biphasic waveforms on the right and triphasic on the left. Right TBI was almost normal, left was normal.   Today's ABI's: Right: 0.54 (was 1.19 on 02-02-16), monophasic waveforms, absent TBI's in all 5 toes, flat waveforms (was almost normal on 02-02-16). Left: 0.87 (was 1.20), biphasic PT, monophasic DP, absent TBI's in all 5 toes, flat waveforms (was normal on 02-02-16).    PLAN:  Based on the patient's vascular studies and examination, and after discussing with Dr. Donnetta Hutching, pt will be scheduled for arteriogram, indication is bilateral blue toes with absent waveforms in all 10 toes,  and significant drop in bilateral ABI's  I discussed in depth with the patient the nature of atherosclerosis, and emphasized the importance of maximal medical management including strict control of blood pressure, blood glucose, and lipid levels, obtaining regular exercise, and continued cessation of smoking.  The patient is aware that without maximal medical management the underlying atherosclerotic disease process will progress, limiting the benefit of any interventions.  The patient was given information about PAD including signs, symptoms, treatment, what symptoms should prompt the patient to seek immediate medical care, and risk reduction measures to take.  Clemon Chambers, RN, MSN, FNP-C [facility] Phone: [phone]  Clinic MD: Early  04/17/16 10:37 AM

## 2016-04-19 ENCOUNTER — Encounter (HOSPITAL_COMMUNITY)
Admission: RE | Disposition: A | Discharge status: Home / Self Care | Payer: Self-pay | Source: Ambulatory Visit | Attending: Vascular Surgery

## 2016-04-19 ENCOUNTER — Ambulatory Visit (HOSPITAL_COMMUNITY)
Admission: RE | Admit: 2016-04-19 | Discharge: 2016-04-19 | Disposition: A | Discharge status: Home / Self Care | Payer: BLUE CROSS/BLUE SHIELD | Source: Ambulatory Visit | Attending: Vascular Surgery | Admitting: Vascular Surgery

## 2016-04-19 DIAGNOSIS — Z833 Family history of diabetes mellitus: Secondary | ICD-10-CM | POA: Diagnosis not present

## 2016-04-19 DIAGNOSIS — T82856A Stenosis of peripheral vascular stent, initial encounter: Secondary | ICD-10-CM | POA: Diagnosis not present

## 2016-04-19 DIAGNOSIS — Z87891 Personal history of nicotine dependence: Secondary | ICD-10-CM | POA: Insufficient documentation

## 2016-04-19 DIAGNOSIS — Z6831 Body mass index (BMI) 31.0-31.9, adult: Secondary | ICD-10-CM | POA: Diagnosis not present

## 2016-04-19 DIAGNOSIS — Y812 Prosthetic and other implants, materials and accessory general- and plastic-surgery devices associated with adverse incidents: Secondary | ICD-10-CM | POA: Insufficient documentation

## 2016-04-19 DIAGNOSIS — Z7902 Long term (current) use of antithrombotics/antiplatelets: Secondary | ICD-10-CM | POA: Diagnosis not present

## 2016-04-19 DIAGNOSIS — I7410 Embolism and thrombosis of unspecified parts of aorta: Secondary | ICD-10-CM | POA: Diagnosis present

## 2016-04-19 DIAGNOSIS — I70203 Unspecified atherosclerosis of native arteries of extremities, bilateral legs: Secondary | ICD-10-CM | POA: Diagnosis not present

## 2016-04-19 DIAGNOSIS — E1151 Type 2 diabetes mellitus with diabetic peripheral angiopathy without gangrene: Secondary | ICD-10-CM | POA: Insufficient documentation

## 2016-04-19 DIAGNOSIS — Z880 Allergy status to penicillin: Secondary | ICD-10-CM | POA: Diagnosis not present

## 2016-04-19 DIAGNOSIS — Z8249 Family history of ischemic heart disease and other diseases of the circulatory system: Secondary | ICD-10-CM | POA: Diagnosis not present

## 2016-04-19 DIAGNOSIS — Z7984 Long term (current) use of oral hypoglycemic drugs: Secondary | ICD-10-CM | POA: Insufficient documentation

## 2016-04-19 DIAGNOSIS — E669 Obesity, unspecified: Secondary | ICD-10-CM | POA: Insufficient documentation

## 2016-04-19 DIAGNOSIS — I739 Peripheral vascular disease, unspecified: Secondary | ICD-10-CM | POA: Diagnosis present

## 2016-04-19 DIAGNOSIS — E039 Hypothyroidism, unspecified: Secondary | ICD-10-CM | POA: Insufficient documentation

## 2016-04-19 DIAGNOSIS — I998 Other disorder of circulatory system: Secondary | ICD-10-CM | POA: Diagnosis not present

## 2016-04-19 HISTORY — PX: PERIPHERAL VASCULAR CATHETERIZATION: SHX172C

## 2016-04-19 LAB — POCT I-STAT, CHEM 8
BUN: 14 mg/dL (ref 6–20)
Calcium, Ion: 0.87 mmol/L — CL (ref 1.15–1.40)
Chloride: 111 mmol/L (ref 101–111)
Creatinine, Ser: 0.7 mg/dL (ref 0.44–1.00)
Glucose, Bld: 99 mg/dL (ref 65–99)
HCT: 38 % (ref 36.0–46.0)
HEMOGLOBIN: 12.9 g/dL (ref 12.0–15.0)
Potassium: 4.6 mmol/L (ref 3.5–5.1)
SODIUM: 136 mmol/L (ref 135–145)
TCO2: 21 mmol/L (ref 0–100)

## 2016-04-19 LAB — GLUCOSE, CAPILLARY: GLUCOSE-CAPILLARY: 91 mg/dL (ref 65–99)

## 2016-04-19 SURGERY — ABDOMINAL AORTOGRAM W/LOWER EXTREMITY

## 2016-04-19 MED ORDER — LIDOCAINE HCL (PF) 1 % IJ SOLN
INTRAMUSCULAR | Status: AC
  Filled 2016-04-19: qty 30

## 2016-04-19 MED ORDER — HEPARIN (PORCINE) IN NACL 2-0.9 UNIT/ML-% IJ SOLN
INTRAMUSCULAR | Status: AC
  Filled 2016-04-19: qty 1000

## 2016-04-19 MED ORDER — FENTANYL CITRATE (PF) 100 MCG/2ML IJ SOLN
INTRAMUSCULAR | Status: AC
  Filled 2016-04-19: qty 2

## 2016-04-19 MED ORDER — LIDOCAINE HCL (PF) 1 % IJ SOLN
INTRAMUSCULAR | Status: DC | PRN
  Administered 2016-04-19: 20 mL via INTRADERMAL

## 2016-04-19 MED ORDER — MIDAZOLAM HCL 2 MG/2ML IJ SOLN
INTRAMUSCULAR | Status: AC
  Filled 2016-04-19: qty 2

## 2016-04-19 MED ORDER — IODIXANOL 320 MG/ML IV SOLN
INTRAVENOUS | Status: DC | PRN
  Administered 2016-04-19: 127 mL via INTRA_ARTERIAL

## 2016-04-19 MED ORDER — FENTANYL CITRATE (PF) 100 MCG/2ML IJ SOLN
INTRAMUSCULAR | Status: DC | PRN
  Administered 2016-04-19: 50 ug via INTRAVENOUS

## 2016-04-19 MED ORDER — MIDAZOLAM HCL 2 MG/2ML IJ SOLN
INTRAMUSCULAR | Status: DC | PRN
  Administered 2016-04-19: 1 mg via INTRAVENOUS

## 2016-04-19 MED ORDER — HEPARIN (PORCINE) IN NACL 2-0.9 UNIT/ML-% IJ SOLN
INTRAMUSCULAR | Status: DC | PRN
  Administered 2016-04-19: 1000 mL

## 2016-04-19 MED ORDER — SODIUM CHLORIDE 0.9 % IV SOLN
INTRAVENOUS | Status: DC
  Administered 2016-04-19: 11:00:00 via INTRAVENOUS

## 2016-04-19 MED ORDER — SODIUM CHLORIDE 0.9 % IV SOLN: 1.0000 mL/kg/h | INTRAVENOUS | Status: DC

## 2016-04-19 MED ORDER — ACETAMINOPHEN 325 MG PO TABS: 650.0000 mg | ORAL_TABLET | ORAL | Status: DC | PRN

## 2016-04-19 SURGICAL SUPPLY — 10 items
CATH OMNI FLUSH 5F 65CM (CATHETERS) ×4 IMPLANT
DEVICE TORQUE .025-.038 (MISCELLANEOUS) ×2 IMPLANT
GUIDEWIRE ANGLED .035X150CM (WIRE) ×2 IMPLANT
KIT PV (KITS) ×2 IMPLANT
SHEATH PINNACLE 5F 10CM (SHEATH) ×2 IMPLANT
SYR MEDRAD MARK V 150ML (SYRINGE) ×2 IMPLANT
TRANSDUCER W/STOPCOCK (MISCELLANEOUS) ×2 IMPLANT
TRAY PV CATH (CUSTOM PROCEDURE TRAY) ×2 IMPLANT
WIRE BENTSON .035X145CM (WIRE) ×2 IMPLANT
WIRE MINI STICK MAX (SHEATH) ×2 IMPLANT

## 2016-04-19 NOTE — Op Note (Addendum)
OPERATIVE NOTE   PROCEDURE: 1.  Left common femoral artery cannulation under ultrasound guidance 2.  Placement of catheter in aorta 3.  Aortogram 4.  Conscious sedation for 33 minutes 5.  Bilateral leg runoff via catheter  PRE-OPERATIVE DIAGNOSIS: bilateral foot ischemia  POST-OPERATIVE DIAGNOSIS: same as above   SURGEON: Leonides SakeBrian Chen, MD  ANESTHESIA: conscious sedation  ESTIMATED BLOOD LOSS: 50 cc  CONTRAST: 127 cc  FINDING(S):  Aorta: patent proximal, occluded distally, lumbar artery collateral appears to provide perfusion into stented left common iliac artery, Middle sacral artery or lumbar collaterals maintains perfusion into right common iliac artery    Right Left  RA patent patent  CIA Occluded proximal stent, reconstituted flow external to stent Patent stent with flow from left ;i,bar artery  EIA patent patent  IIA patent patent  CFA patent patent  SFA patent patent  PFA patent patent  Pop patent patent  Trif patent patent  AT Underfills at mid-calf Aberrant takeoff at knee, runoff to foot  Pero Underfills at mid-calf Patent, appears to dwindle at mid-calf  PT Patent, dominant runoff Patent, dominant runoff   SPECIMEN(S):  none  INDICATIONS:   Brianna Powers is a 51 y.o. female who presents with bilateral foot ischemia in the setting of prior bilateral common iliac artery placement.  The patient presents for: aortogram, bilateral leg runoff, and possible intervention.  I discussed with the patient the nature of angiographic procedures, especially the limited patencies of any endovascular intervention.  The patient is aware of that the risks of an angiographic procedure include but are not limited to: bleeding, infection, access site complications, renal failure, embolization, rupture of vessel, dissection, possible need for emergent surgical intervention, possible need for surgical procedures to treat the patient's pathology, and stroke and death.  The patient  is aware of the risks and agrees to proceed.  DESCRIPTION: After full informed consent was obtained from the patient, the patient was brought back to the angiography suite.  The patient was placed supine upon the angiography table and connected to cardiopulmonary monitoring equipment.  The patient was then given conscious sedation, the amounts of which are documented in the patient's chart.  A circulating radiologic technician maintained continuous monitoring of the patient's cardiopulmonary status.  Additionally, the control room radiologic technician provided backup monitoring throughout the procedure.  The patient was prepped and drape in the standard fashion for an angiographic procedure.  At this point, attention was turned to the left groin.  Under ultrasound guidance, the subcutaneous tissue surrounding the left common femoral artery was anesthesized with 1% lidocaine with epinephrine.  The artery was then cannulated with a micropuncture needle.  The microwire was advanced into the iliac arterial system.  The needle was exchanged for a microsheath, which was loaded into the common femoral artery over the wire.  The microwire was exchanged for a Bentson wire which was advanced into the distal aorta where it curled up.  The microsheath was then exchanged for a 5-Fr sheath which was loaded into the common femoral artery.   I loaded the Omniflush over the wire into the left common iliac artery stent.  I could not redirect the wire into the proximal aorta, so I exchanged the wire for a glidewire.  Using this combination, I was able to get into the infrarenal aorta without difficulty.  The Omniflush catheter was then loaded over the wire up to the level of L1.  The catheter was connected to the power injector circuit.  After de-airring and de-clotting the circuit, a power injector aortogram was completed.  This demonstrated a filling defect in distal aorta.  I pulled the catheter in the distal aorta but  proximal to the possible occlusion.  RAO and LAO pelvic injections were obtained.  The findings are listed above.    At this point, an automated bilateral leg runoff was completed.  The findings are listed above.  Given the ease of getting through the occluded segment of aorta, I have concerns there is thrombus here, so extending the iliac stent proximal would not be risky for possible thromboembolism from the distal aorta.  I replaced the wire into the catheter, straightening out the crook in the catheter.  Both were removed from the sheath together.  The sheath was aspirated.  No clots were present and the sheath was reloaded with heparinized saline.     COMPLICATIONS: none  CONDITION: stable   Leonides Sake, MD, [facility] [place] Office: [phone] Pager: [phone]  04/19/2016, 12:23 PM

## 2016-04-19 NOTE — Interval H&P Note (Signed)
[facility] of [place]  History and Physical Update  The patient was interviewed and re-examined.  The patient's previous History and Physical has been reviewed and is unchanged from Dr. Bosie HelperEarly's NP consult.  There is no change in the plan of care: aortogram, bilateral leg runoff, and possible intervention.   I discussed with the patient the nature of angiographic procedures, especially the limited patencies of any endovascular intervention.    The patient is aware of that the risks of an angiographic procedure include but are not limited to: bleeding, infection, access site complications, renal failure, embolization, rupture of vessel, dissection, arteriovenous fistula, possible need for emergent surgical intervention, possible need for surgical procedures to treat the patient's pathology, anaphylactic reaction to contrast, and stroke and death.    The patient is aware of the risks and agrees to proceed.   Brianna SakeBrian Fawn Desrocher, MD [facility] [place] Office: [phone] Pager: [phone]  04/19/2016, 10:02 AM

## 2016-04-19 NOTE — Progress Notes (Signed)
Site area: left groin Site Prior to Removal:  Level 0 Pressure Applied For: 20 minues Manual:   yes Patient Status During Pull:  stable Post Pull Site:  Level 0 Post Pull Instructions Given:  yes Post Pull Pulses Present: yes Dressing Applied:  yes Bedrest begins @ 1300 Comments:

## 2016-04-19 NOTE — H&P (View-Only) (Signed)
[facility] [place]   CC: Follow up peripheral artery occlusive disease  History of Present Illness Brianna Powers is a 51 y.o. female female patient of Dr. Donnetta Hutching who is s/p bilateral common iliac artery angioplasty with femoral cutdown on 01/28/2015. She presented with ischemic changes to the toes of both feet. Workup revealed a high-grade stenosis at the bifurcation of her aorta with probable embolus to her feet. It was unknown whether this was thrombus or plaque.  She had complete resolution of both feet changes.   She returns today with c/o both legs feeling heavy since 04-12-16 (5 days hx) after walking about 50 feet, right more so than left leg, resolves with rest.  Numbness and pain in both feet is intermittent. Right 3rd and 4th toe tips are cyanotic, left 2nd and 5th toe tips are cyanotic.   She has done well regarding discontinuation of smoking which she understands is critical. She is under a lot of emotional stress with the recent death of her father after prolonged illness and also a recent fall with injury from her mother.  She is struggling with trying to lose weight.  She denies non healing wounds.  She walks 10-20 minutes daily.  She denies any hx of stroke or TIA.  Pt Diabetic: Yes, last A1C result on file was 6.2 in November 2016 Pt smoker: quit in November 2017  Pt meds include: Statin :No Betablocker: No ASA: No Other anticoagulants/antiplatelets: Plavix     Past Medical History:  Diagnosis Date  . Diabetes mellitus without complication ([place])   . Hypothyroidism   . Peripheral vascular disease [facility])     Social History Social History  Substance Use Topics  . Smoking status: Former Smoker    Quit date: 05/25/2014  . Smokeless tobacco: Never Used  . Alcohol use No    Family History Family History  Problem Relation Age of Onset  . CAD Father   . Diabetes type II Father     Past Surgical History:  Procedure  Laterality Date  . FEMORAL ARTERY EXPLORATION Bilateral 01/28/2015   Procedure: BILATERAL FEMORAL ARTERY EXPLORATION;  Surgeon: Rosetta Posner, MD;  Location: [place];  Service: Vascular;  Laterality: Bilateral;  . INSERTION OF ILIAC STENT Bilateral 01/28/2015   Procedure: INSERTION OF RIGHT AND LEFT ILIAC STENT;  Surgeon: Rosetta Posner, MD;  Location: [place];  Service: Vascular;  Laterality: Bilateral;  . PERIPHERAL VASCULAR CATHETERIZATION N/A 01/26/2015   Procedure: Abdominal Aortogram;  Surgeon: Rosetta Posner, MD;  Location: [place] [facility];  Service: Cardiovascular;  Laterality: N/A;    Allergies  Allergen Reactions  . Amoxicillin Rash    Panic attacks.     Current Outpatient Prescriptions  Medication Sig Dispense Refill  . ACCU-CHEK AVIVA PLUS test strip TEST twice a day 50 each PRN  . ACCU-CHEK SOFTCLIX LANCETS lancets TEST twice a day 100 each PRN  . clopidogrel (PLAVIX) 75 MG tablet Take 1 tablet (75 mg total) by mouth daily. 30 tablet 3  . gabapentin (NEURONTIN) 100 MG capsule Take 100 mg by mouth 2 (two) times daily as needed. Reported on 05/24/2015  0  . glucose monitoring kit (FREESTYLE) monitoring kit 1 each by Does not apply route as needed for other. Use as directed 1 each 0  . metFORMIN (GLUCOPHAGE) 500 MG tablet Take 500 mg by mouth 2 (two) times daily.  0  . Multiple Vitamin (MULTIVITAMIN) tablet Take 1 tablet by mouth daily.    Marland Kitchen  SYNTHROID 100 MCG tablet take 1 tablet by mouth once daily 30 tablet 1  . phentermine 15 MG capsule Take 15 mg by mouth daily.  0  . traMADol (ULTRAM) 50 MG tablet Take 1 tablet (50 mg total) by mouth every 6 (six) hours as needed for moderate pain. (Patient not taking: Reported on 04/17/2016) 30 tablet 0   No current facility-administered medications for this visit.     ROS: See HPI for pertinent positives and negatives.   Physical Examination  Vitals:   04/17/16 1022  BP: 120/90  Pulse: 73  Resp: 18  Temp: 97.2 F (36.2 C)  TempSrc:  Oral  SpO2: 95%  Weight: 159 lb (72.1 kg)  Height: 5' (1.524 m)   Body mass index is 31.05 kg/m.  General: A&O x 3, WDWN, obese female. Gait: normal Eyes: PERRLA. Pulmonary: Respirations are non labored, CTAB, good air movement Cardiac: regular rhythm, no detected murmur.    Carotid Bruits Right Left   Negative Negative  Aorta is notpalpable. Radial pulses: not palpable and,  + brisk Doppler signal in both radial arteries  VASCULAR EXAM: Extremitieswithischemic changes: Right 3rd and 4th toe tips are cyanotic, left 2nd and 5th toe tips are cyanotic.    withoutGangrene; withoutopen wounds.   LE Pulses Right Left  FEMORAL notpalpable notpalpable  POPLITEAL notpalpable notpalpable  POSTERIOR TIBIAL Not palpable notpalpable  DORSALIS PEDIS ANTERIOR TIBIAL Notpalpable Notpalpable   Abdomen: soft, NT, no palpable masses. Skin: no rashes, no ulcers, see Extremities. Musculoskeletal: no muscle wasting or atrophy. Neurologic: A&O X 3; Appropriate Affect ; SENSATION: normal; MOTOR FUNCTION: moving all extremities equally, motor strength 4/5 throughout. Speech is fluent/normal. CN 2-12 intact.    ASSESSMENT: Brianna Powers is a 51 y.o. female who is s/p bilateral common iliac artery angioplasty with femoral cutdown on 01/28/2015. Both legs feel heavy since 04-12-16 (5 days hx) after walking about 50 feet, right more so than left leg, resolves with rest.  Numbness and pain in both feet is intermittent. Right 3rd and 4th toe tips are cyanotic, left 2nd and 5th toe tips are cyanotic.   Her atherosclerotic risk factors include currently controlled DM, former smoking, and obesity.   Dr. Early spoke with and examined pt.  DATA Aortoiliac duplex on 02-02-16 with decreased visualization of the abdominal vasculature due to overlying bowel gas and body habitus. Internal iliac artery not  visualized. Bilateral CIA stent with no significant stenosis, mostly triphasic, some biphasic waveforms.  Bilateral ABI's remained normal with biphasic waveforms on the right and triphasic on the left. Right TBI was almost normal, left was normal.   Today's ABI's: Right: 0.54 (was 1.19 on 02-02-16), monophasic waveforms, absent TBI's in all 5 toes, flat waveforms (was almost normal on 02-02-16). Left: 0.87 (was 1.20), biphasic PT, monophasic DP, absent TBI's in all 5 toes, flat waveforms (was normal on 02-02-16).    PLAN:  Based on the patient's vascular studies and examination, and after discussing with Dr. Early, pt will be scheduled for arteriogram, indication is bilateral blue toes with absent waveforms in all 10 toes,  and significant drop in bilateral ABI's  I discussed in depth with the patient the nature of atherosclerosis, and emphasized the importance of maximal medical management including strict control of blood pressure, blood glucose, and lipid levels, obtaining regular exercise, and continued cessation of smoking.  The patient is aware that without maximal medical management the underlying atherosclerotic disease process will progress, limiting the benefit of any interventions.    The patient was given information about PAD including signs, symptoms, treatment, what symptoms should prompt the patient to seek immediate medical care, and risk reduction measures to take.  Clemon Chambers, RN, MSN, FNP-C [facility] Phone: [phone]  Clinic MD: Early  04/17/16 10:37 AM

## 2016-04-19 NOTE — Discharge Instructions (Signed)
Femoral Site Care °Introduction °Refer to this sheet in the next few weeks. These instructions provide you with information about caring for yourself after your procedure. Your health care provider may also give you more specific instructions. Your treatment has been planned according to current medical practices, but problems sometimes occur. Call your health care provider if you have any problems or questions after your procedure. °What can I expect after the procedure? °After your procedure, it is typical to have the following: °· Bruising at the site that usually fades within 1-2 weeks. °· Blood collecting in the tissue (hematoma) that may be painful to the touch. It should usually decrease in size and tenderness within 1-2 weeks. °Follow these instructions at home: °· Take medicines only as directed by your health care provider. °· You may shower 24-48 hours after the procedure or as directed by your health care provider. Remove the bandage (dressing) and gently wash the site with plain soap and water. Pat the area dry with a clean towel. Do not rub the site, because this may cause bleeding. °· Do not take baths, swim, or use a hot tub until your health care provider approves. °· Check your insertion site every day for redness, swelling, or drainage. °· Do not apply powder or lotion to the site. °· Limit use of stairs to twice a day for the first 2-3 days or as directed by your health care provider. °· Do not squat for the first 2-3 days or as directed by your health care provider. °· Do not lift over 10 lb (4.5 kg) for 5 days after your procedure or as directed by your health care provider. °· Ask your health care provider when it is okay to: °¨ Return to work or school. °¨ Resume usual physical activities or sports. °¨ Resume sexual activity. °· Do not drive home if you are discharged the same day as the procedure. Have someone else drive you. °· You may drive 24 hours after the procedure unless otherwise  instructed by your health care provider. °· Do not operate machinery or power tools for 24 hours after the procedure or as directed by your health care provider. °· If your procedure was done as an outpatient procedure, which means that you went home the same day as your procedure, a responsible adult should be with you for the first 24 hours after you arrive home. °· Keep all follow-up visits as directed by your health care provider. This is important. °Contact a health care provider if: °· You have a fever. °· You have chills. °· You have increased bleeding from the site. Hold pressure on the site. °Get help right away if: °· You have unusual pain at the site. °· You have redness, warmth, or swelling at the site. °· You have drainage (other than a small amount of blood on the dressing) from the site. °· The site is bleeding, and the bleeding does not stop after 30 minutes of holding steady pressure on the site. °· Your leg or foot becomes pale, cool, tingly, or numb. °This information is not intended to replace advice given to you by your health care provider. Make sure you discuss any questions you have with your health care provider. °Document Released: 11/13/2013 Document Revised: 08/18/2015 Document Reviewed: 09/29/2013 °© 2017 Elsevier ° °

## 2016-04-20 ENCOUNTER — Telehealth: Payer: Self-pay | Admitting: Vascular Surgery

## 2016-04-20 ENCOUNTER — Encounter (HOSPITAL_COMMUNITY): Payer: Self-pay | Admitting: Vascular Surgery

## 2016-04-20 NOTE — Telephone Encounter (Signed)
spoke to pt on home # was aware of appt due to [place], mailed lttr as well 2/6 at 8:30

## 2016-04-20 NOTE — Telephone Encounter (Signed)
-----   Message from Sharee PimpleMarilyn K McChesney, RN sent at 04/19/2016 12:44 PM EST ----- Regarding: 1-2 weeks   ----- Message ----- From: Fransisco HertzBrian L Chen, MD Sent: 04/19/2016  12:34 PM To: [address]  Brianna Powers [id number] 1965/07/25  PROCEDURE: 1.  Left common femoral artery cannulation under ultrasound guidance 2.  Placement of catheter in aorta 3.  Aortogram 4.  Conscious sedation for 33 minutes 5.  Bilateral leg runoff via catheter  Follow-up: 1-2 weeks with Dr. Arbie CookeyEarly

## 2016-04-24 ENCOUNTER — Encounter: Payer: Self-pay | Admitting: Vascular Surgery

## 2016-04-24 ENCOUNTER — Other Ambulatory Visit: Payer: Self-pay

## 2016-04-28 DIAGNOSIS — R7301 Impaired fasting glucose: Secondary | ICD-10-CM | POA: Diagnosis not present

## 2016-04-28 DIAGNOSIS — E785 Hyperlipidemia, unspecified: Secondary | ICD-10-CM | POA: Diagnosis not present

## 2016-04-28 DIAGNOSIS — E039 Hypothyroidism, unspecified: Secondary | ICD-10-CM | POA: Diagnosis not present

## 2016-04-28 DIAGNOSIS — I739 Peripheral vascular disease, unspecified: Secondary | ICD-10-CM | POA: Diagnosis not present

## 2016-05-01 ENCOUNTER — Encounter: Payer: BLUE CROSS/BLUE SHIELD | Admitting: Vascular Surgery

## 2016-05-02 ENCOUNTER — Encounter (HOSPITAL_COMMUNITY): Payer: Self-pay | Admitting: *Deleted

## 2016-05-02 ENCOUNTER — Other Ambulatory Visit: Payer: Self-pay | Admitting: *Deleted

## 2016-05-02 ENCOUNTER — Encounter (HOSPITAL_COMMUNITY)
Admission: RE | Disposition: A | Discharge status: Home / Self Care | Payer: Self-pay | Source: Ambulatory Visit | Attending: Vascular Surgery

## 2016-05-02 ENCOUNTER — Ambulatory Visit (HOSPITAL_COMMUNITY)
Admission: RE | Admit: 2016-05-02 | Discharge: 2016-05-02 | Disposition: A | Discharge status: Home / Self Care | Payer: BLUE CROSS/BLUE SHIELD | Source: Ambulatory Visit | Attending: Vascular Surgery | Admitting: Vascular Surgery

## 2016-05-02 DIAGNOSIS — Z6829 Body mass index (BMI) 29.0-29.9, adult: Secondary | ICD-10-CM | POA: Insufficient documentation

## 2016-05-02 DIAGNOSIS — Z7984 Long term (current) use of oral hypoglycemic drugs: Secondary | ICD-10-CM | POA: Diagnosis not present

## 2016-05-02 DIAGNOSIS — E669 Obesity, unspecified: Secondary | ICD-10-CM | POA: Insufficient documentation

## 2016-05-02 DIAGNOSIS — T82856A Stenosis of peripheral vascular stent, initial encounter: Secondary | ICD-10-CM | POA: Insufficient documentation

## 2016-05-02 DIAGNOSIS — Y831 Surgical operation with implant of artificial internal device as the cause of abnormal reaction of the patient, or of later complication, without mention of misadventure at the time of the procedure: Secondary | ICD-10-CM | POA: Diagnosis not present

## 2016-05-02 DIAGNOSIS — Z880 Allergy status to penicillin: Secondary | ICD-10-CM | POA: Diagnosis not present

## 2016-05-02 DIAGNOSIS — E039 Hypothyroidism, unspecified: Secondary | ICD-10-CM | POA: Diagnosis not present

## 2016-05-02 DIAGNOSIS — Z8249 Family history of ischemic heart disease and other diseases of the circulatory system: Secondary | ICD-10-CM | POA: Insufficient documentation

## 2016-05-02 DIAGNOSIS — I739 Peripheral vascular disease, unspecified: Secondary | ICD-10-CM

## 2016-05-02 DIAGNOSIS — Z833 Family history of diabetes mellitus: Secondary | ICD-10-CM | POA: Diagnosis not present

## 2016-05-02 DIAGNOSIS — Z87891 Personal history of nicotine dependence: Secondary | ICD-10-CM | POA: Diagnosis not present

## 2016-05-02 DIAGNOSIS — E1151 Type 2 diabetes mellitus with diabetic peripheral angiopathy without gangrene: Secondary | ICD-10-CM | POA: Insufficient documentation

## 2016-05-02 DIAGNOSIS — Z7902 Long term (current) use of antithrombotics/antiplatelets: Secondary | ICD-10-CM | POA: Insufficient documentation

## 2016-05-02 DIAGNOSIS — I7410 Embolism and thrombosis of unspecified parts of aorta: Secondary | ICD-10-CM

## 2016-05-02 DIAGNOSIS — Z9862 Peripheral vascular angioplasty status: Secondary | ICD-10-CM

## 2016-05-02 HISTORY — PX: LOWER EXTREMITY ANGIOGRAPHY: CATH118251

## 2016-05-02 HISTORY — PX: ABDOMINAL AORTOGRAM: CATH118222

## 2016-05-02 LAB — POCT I-STAT, CHEM 8
BUN: 16 mg/dL (ref 6–20)
Calcium, Ion: 1.26 mmol/L (ref 1.15–1.40)
Chloride: 107 mmol/L (ref 101–111)
Creatinine, Ser: 0.6 mg/dL (ref 0.44–1.00)
Glucose, Bld: 107 mg/dL — ABNORMAL HIGH (ref 65–99)
HEMATOCRIT: 43 % (ref 36.0–46.0)
HEMOGLOBIN: 14.6 g/dL (ref 12.0–15.0)
Potassium: 4.8 mmol/L (ref 3.5–5.1)
SODIUM: 143 mmol/L (ref 135–145)
TCO2: 29 mmol/L (ref 0–100)

## 2016-05-02 LAB — POCT ACTIVATED CLOTTING TIME
ACTIVATED CLOTTING TIME: 235 s
ACTIVATED CLOTTING TIME: 213 s
Activated Clotting Time: 180 seconds

## 2016-05-02 LAB — GLUCOSE, CAPILLARY: Glucose-Capillary: 98 mg/dL (ref 65–99)

## 2016-05-02 SURGERY — LOWER EXTREMITY ANGIOGRAPHY

## 2016-05-02 MED ORDER — ACETAMINOPHEN 325 MG PO TABS
ORAL_TABLET | ORAL | Status: AC
  Filled 2016-05-02: qty 2

## 2016-05-02 MED ORDER — HEPARIN SODIUM (PORCINE) 1000 UNIT/ML IJ SOLN
INTRAMUSCULAR | Status: AC
  Filled 2016-05-02: qty 1

## 2016-05-02 MED ORDER — OXYCODONE-ACETAMINOPHEN 5-325 MG PO TABS: 1.0000 | ORAL_TABLET | ORAL | Status: DC | PRN

## 2016-05-02 MED ORDER — MIDAZOLAM HCL 2 MG/2ML IJ SOLN
INTRAMUSCULAR | Status: DC | PRN
  Administered 2016-05-02 (×2): 1 mg via INTRAVENOUS

## 2016-05-02 MED ORDER — CLOPIDOGREL BISULFATE 300 MG PO TABS
ORAL_TABLET | ORAL | Status: AC
  Administered 2016-05-02: 300 mg via ORAL
  Filled 2016-05-02: qty 1

## 2016-05-02 MED ORDER — ACETAMINOPHEN 325 MG PO TABS
650.0000 mg | ORAL_TABLET | ORAL | Status: DC | PRN
Start: 2016-05-02 — End: 2016-05-02

## 2016-05-02 MED ORDER — CLOPIDOGREL BISULFATE 75 MG PO TABS
300.0000 mg | ORAL_TABLET | Freq: Once | ORAL | Status: AC
  Administered 2016-05-02: 300 mg via ORAL

## 2016-05-02 MED ORDER — MORPHINE SULFATE (PF) 4 MG/ML IV SOLN: 2.0000 mg | INTRAVENOUS | Status: DC | PRN

## 2016-05-02 MED ORDER — SODIUM CHLORIDE 0.9 % IV SOLN
INTRAVENOUS | Status: DC
  Administered 2016-05-02: 11:00:00 via INTRAVENOUS

## 2016-05-02 MED ORDER — HEPARIN SODIUM (PORCINE) 1000 UNIT/ML IJ SOLN
INTRAMUSCULAR | Status: DC | PRN
Start: 2016-05-02 — End: 2016-05-02
  Administered 2016-05-02: 7000 [IU] via INTRAVENOUS

## 2016-05-02 MED ORDER — HEPARIN (PORCINE) IN NACL 2-0.9 UNIT/ML-% IJ SOLN
INTRAMUSCULAR | Status: DC | PRN
  Administered 2016-05-02: 1000 mL

## 2016-05-02 MED ORDER — FENTANYL CITRATE (PF) 100 MCG/2ML IJ SOLN
INTRAMUSCULAR | Status: AC
  Filled 2016-05-02: qty 2

## 2016-05-02 MED ORDER — FENTANYL CITRATE (PF) 100 MCG/2ML IJ SOLN
INTRAMUSCULAR | Status: DC | PRN
  Administered 2016-05-02 (×3): 50 ug via INTRAVENOUS

## 2016-05-02 MED ORDER — MIDAZOLAM HCL 2 MG/2ML IJ SOLN
INTRAMUSCULAR | Status: AC
  Filled 2016-05-02: qty 2

## 2016-05-02 MED ORDER — LIDOCAINE HCL (PF) 1 % IJ SOLN
INTRAMUSCULAR | Status: DC | PRN
  Administered 2016-05-02 (×2): 15 mL

## 2016-05-02 MED ORDER — IODIXANOL 320 MG/ML IV SOLN
INTRAVENOUS | Status: DC | PRN
  Administered 2016-05-02: 125 mL via INTRA_ARTERIAL

## 2016-05-02 MED ORDER — LIDOCAINE HCL (PF) 1 % IJ SOLN
INTRAMUSCULAR | Status: AC
  Filled 2016-05-02: qty 30

## 2016-05-02 MED ORDER — HEPARIN (PORCINE) IN NACL 2-0.9 UNIT/ML-% IJ SOLN
INTRAMUSCULAR | Status: AC
  Filled 2016-05-02: qty 1000

## 2016-05-02 MED ORDER — SODIUM CHLORIDE 0.9 % IV SOLN: 1.0000 mL/kg/h | INTRAVENOUS | Status: DC

## 2016-05-02 SURGICAL SUPPLY — 20 items
CATH ANGIO 5F BER2 65CM (CATHETERS) ×3 IMPLANT
CATH OMNI FLUSH 5F 65CM (CATHETERS) ×3 IMPLANT
COVER DOME SNAP 22 D (MISCELLANEOUS) ×3 IMPLANT
COVER PRB 48X5XTLSCP FOLD TPE (BAG) ×1 IMPLANT
COVER PROBE 5X48 (BAG) ×2
DEVICE TORQUE .025-.038 (MISCELLANEOUS) ×3 IMPLANT
GUIDEWIRE ANGLED .035X150CM (WIRE) ×3 IMPLANT
KIT ENCORE 26 ADVANTAGE (KITS) ×6 IMPLANT
KIT PV (KITS) ×3 IMPLANT
SHEATH BRITE TIP 7FR 35CM (SHEATH) ×6 IMPLANT
SHEATH PINNACLE 5F 10CM (SHEATH) ×3 IMPLANT
STENT VIABAHN VBX 7X59X135 (Permanent Stent) ×3 IMPLANT
STENT VIABAHN VBX 7X59X80 (Permanent Stent) ×3 IMPLANT
SYR MEDRAD MARK V 150ML (SYRINGE) ×3 IMPLANT
TRANSDUCER W/STOPCOCK (MISCELLANEOUS) ×3 IMPLANT
TRAY PV CATH (CUSTOM PROCEDURE TRAY) ×3 IMPLANT
WIRE AMPLATZ SS-J .035X180CM (WIRE) ×3 IMPLANT
WIRE AMPLATZ SS-J .035X260CM (WIRE) ×3 IMPLANT
WIRE BENTSON .035X145CM (WIRE) ×3 IMPLANT
WIRE MINI STICK MAX (SHEATH) ×6 IMPLANT

## 2016-05-02 NOTE — Discharge Instructions (Signed)

## 2016-05-02 NOTE — Progress Notes (Signed)
Site area: Left groin a 7 french long arterial sheath was removed  Site Prior to Removal:  Level 0  Pressure Applied For 15 MINUTES    Bedrest Beginning at 1350p  Manual:   Yes.    Patient Status During Pull:  stable  Post Pull Groin Site:  Level 0  Post Pull Instructions Given:  Yes.    Post Pull Pulses Present:  Yes.    Dressing Applied:  Yes.  Pressure dressing applied  Comments:  VS remain stable

## 2016-05-02 NOTE — Op Note (Signed)
    Patient name: Brianna Powers MRN: [id number] DOB: 07-17-1965 Sex: female  05/02/2016 Pre-operative Diagnosis: instent restenosis of bilateral common iliac artery stents Post-operative diagnosis:  Same Surgeon:  Luanna SalkBrandon C. Randie Heinzain, MD Procedure Performed: 1.  US guided cannulation of bilateral common femoral arteries 2.  Aortogram with bilateral lower extremity runoff 3.  Bilateral common iliac artery stenting with 7 x 59 VBX 4. Moderate sedation with fentanyl and versed for 56 minutes.   Indications:  51 year old female previous history of embolizing lesion of her distal aorta proximal bilateral common iliac arteries. She underwent stenting and now has occlusion of the right common iliac stent with stenosis on the left. She is therefore indicated for the above procedure.  Findings: There was occlusion of the right common iliac artery stent. There is 50% stenosis of the left common iliac artery stent. Following stenting there is 0% residual stenosis with three-vessel runoff to the bilateral feet.   Procedure:  The patient was identified in the holding area and taken to room 8.  The patient was then placed supine on the table and prepped and draped in the usual sterile fashion.  A time out was called.  Ultrasound was used to evaluate the left common femoral artery first. This was cannulated with micropuncture needle and wire Mike puncture sheath was placed. Over Amplatz wire we then placed a 7 French sheath into the external iliac artery. Attention was then turned to the right side where similar procedure was performed using micropuncture set followed by Amplatz wire and 7 French sheath. At this time the patient was heparinized and ACT returned to 235. We used Glidewire and BER catheter bilaterally to cross the stenoses and occlusions of the existing stents. We confirmed intraluminal access with angiogram. We then placed bilateral Amplatz wires and cross the lesions with our 7 French sheath. The VBX  stents were then brought to the intended level sheath were retracted and stents were deployed simultaneously with balloon dilation for 1 minute in a kissing iliac fashion. Following this we exchanged on the left side for Omni Flush catheter performed aortogram with bilateral lower extremity runoff with the above findings. Satisfied the wires and catheters were removed sheath will be pulled in the postoperative holding area. Patient tolerated the procedure well without any competition.  Contrast: 125cc   Jahon Bart C. Randie Heinzain, MD [facility] [place] Office: [phone] Pager: [phone]

## 2016-05-02 NOTE — H&P (Signed)
HP   History of Present Illness: Brianna Powers is a 51 y.o. female female patient of Dr. Donnetta Hutching who is s/p bilateral common iliac artery angioplasty with femoral cutdown on 01/28/2015. She presented with ischemic changes to the toes ofboth feet. Workup revealed a high-grade stenosis at the bifurcation of her aorta with probable embolus to her feet. It was unknown whether this was thrombus or plaque.  She had complete resolution of both feet changes.   She returns today with c/o both legs feeling heavy since 04-12-16 (5 days hx) after walking about 50 feet, right more so than left leg, resolves with rest.  Numbness and pain in both feet is intermittent. Right 3rd and 4th toe tips are cyanotic, left 2nd and 5th toe tips are cyanotic.   She has done well regarding discontinuation of smoking which she understands is critical. She is under a lot of emotional stress with the recent death of her father after prolonged illness and also a recent fall with injury from her mother.  She is struggling with trying to lose weight.  Past Medical History:  Diagnosis Date  . Diabetes mellitus without complication ([place])   . Hypothyroidism   . Peripheral vascular disease [facility])     Past Surgical History:  Procedure Laterality Date  . FEMORAL ARTERY EXPLORATION Bilateral 01/28/2015   Procedure: BILATERAL FEMORAL ARTERY EXPLORATION;  Surgeon: Rosetta Posner, MD;  Location: [place];  Service: Vascular;  Laterality: Bilateral;  . INSERTION OF ILIAC STENT Bilateral 01/28/2015   Procedure: INSERTION OF RIGHT AND LEFT ILIAC STENT;  Surgeon: Rosetta Posner, MD;  Location: [place];  Service: Vascular;  Laterality: Bilateral;  . PERIPHERAL VASCULAR CATHETERIZATION N/A 01/26/2015   Procedure: Abdominal Aortogram;  Surgeon: Rosetta Posner, MD;  Location: [place] [facility];  Service: Cardiovascular;  Laterality: N/A;  . PERIPHERAL VASCULAR CATHETERIZATION N/A 04/19/2016   Procedure: Abdominal Aortogram w/Lower Extremity;   Surgeon: Conrad Duncan, MD;  Location: [place] [facility];  Service: Cardiovascular;  Laterality: N/A;    Allergies  Allergen Reactions  . Amoxicillin Rash    Panic attacks.     Prior to Admission medications   Medication Sig Start Date End Date Taking? Authorizing Provider  SYNTHROID 100 MCG tablet take 1 tablet by mouth once daily 06/10/13  Yes Florian Buff, MD  ACCU-CHEK AVIVA PLUS test strip TEST twice a day 11/08/12   Florian Buff, MD  ACCU-CHEK SOFTCLIX LANCETS lancets TEST twice a day 11/08/12   Florian Buff, MD  clopidogrel (PLAVIX) 75 MG tablet Take 1 tablet (75 mg total) by mouth daily. 01/30/15   Ulyses Amor, PA-C  gabapentin (NEURONTIN) 100 MG capsule Take 100 mg by mouth 2 (two) times daily as needed. Reported on 05/24/2015 01/03/15   Historical Provider, MD  glucose monitoring kit (FREESTYLE) monitoring kit 1 each by Does not apply route as needed for other. Use as directed 10/08/12   Florian Buff, MD  metFORMIN (GLUCOPHAGE) 500 MG tablet Take 500 mg by mouth 2 (two) times daily with a meal.    Historical Provider, MD  Multiple Vitamin (MULTIVITAMIN) tablet Take 1 tablet by mouth daily.    Historical Provider, MD  phentermine 15 MG capsule Take 15 mg by mouth daily. 02/10/16   Historical Provider, MD  traMADol (ULTRAM) 50 MG tablet Take 50 mg by mouth every 6 (six) hours as needed for moderate pain.    Historical Provider, MD    Social History  Social History  . Marital status: Married    Spouse name: N/A  . Number of children: N/A  . Years of education: N/A   Occupational History  . Not on file.   Social History Main Topics  . Smoking status: Former Smoker    Quit date: 05/25/2014  . Smokeless tobacco: Never Used  . Alcohol use No  . Drug use: No  . Sexual activity: Not on file   Other Topics Concern  . Not on file   Social History Narrative  . No narrative on file     Family History  Problem Relation Age of Onset  . CAD Father   . Diabetes type II  Father     Physical Examination  Vitals:   05/02/16 0822  BP: 128/76  Pulse: 80  Resp: 18  Temp: 98.7 F (37.1 C)   Body mass index is 29.26 kg/m.  General: A&O x 3, WDWN, obese female. Gait: normal Eyes: PERRLA. Pulmonary: Respirations are non labored, CTAB, good air movement Cardiac: regular rhythm, no detected murmur.    Carotid Bruits Right Left   Negative Negative  Aorta is notpalpable. Radial pulses: notpalpable and, + brisk Doppler signal in both radial arteries  VASCULAR EXAM: Extremitieswithischemic changes: Right 3rd and 4th toe tips are cyanotic, left 2nd and 5th toe tips are cyanotic.    withoutGangrene; withoutopen wounds.   LE Pulses Right Left  FEMORAL notpalpable notpalpable  POPLITEAL notpalpable notpalpable  POSTERIOR TIBIAL Not palpable notpalpable  DORSALIS PEDIS ANTERIOR TIBIAL Notpalpable Notpalpable   Abdomen: soft, NT, no palpable masses. Skin: no rashes, no ulcers, see Extremities. Musculoskeletal: no muscle wasting or atrophy. Neurologic: A&O X 3; Appropriate Affect ; SENSATION: normal; MOTOR FUNCTION: moving all extremities equally, motor strength 4/5 throughout. Speech is fluent/normal. CN 2-12 intact.     CBC    Component Value Date/Time   WBC 13.4 (H) 01/29/2015 0448   RBC 3.70 (L) 01/29/2015 0448   HGB 12.9 04/19/2016 1112   HCT 38.0 04/19/2016 1112   PLT 249 01/29/2015 0448   MCV 94.1 01/29/2015 0448   MCH 30.3 01/29/2015 0448   MCHC 32.2 01/29/2015 0448   RDW 12.8 01/29/2015 0448    BMET    Component Value Date/Time   NA 136 04/19/2016 1112   K 4.6 04/19/2016 1112   CL 111 04/19/2016 1112   CO2 25 01/29/2015 0448   GLUCOSE 99 04/19/2016 1112   BUN 14 04/19/2016 1112   CREATININE 0.70 04/19/2016 1112   CALCIUM 9.0 01/29/2015 0448   GFRNONAA >60 01/29/2015 0448   GFRAA >60 01/29/2015 0448    COAGS: Lab Results  Component Value  Date   INR 1.06 01/26/2015     Non-Invasive Vascular Imaging:     ASSESSMENT/PLAN: This is a 51 y.o. female with history of embolic debris to bilateral feet. Now on plavix and not smoking. Will plan covered stenting today. Discussed risks including failure to cross lesion and possible embolization and she agrees to proceed.   Rembert Browe C. Donzetta Matters, MD [facility] [place] Office: [phone] Pager: [phone]

## 2016-05-02 NOTE — Progress Notes (Addendum)
Site area: Right groin a 7 french long arterial sheath was removed  Site Prior to Removal:  Level 0  Pressure Applied For 15 MINUTES    Bedrest Beginning at 1350p  Manual:   Yes.    Patient Status During Pull:  stable  Post Pull Groin Site:  Level 0  Post Pull Instructions Given:  Yes.    Post Pull Pulses Present:  Yes.    Dressing Applied:  Yes.  Pressure dressing applied  Comments:  VS remain stable during sheath pull

## 2016-05-03 ENCOUNTER — Encounter (HOSPITAL_COMMUNITY): Payer: Self-pay | Admitting: Vascular Surgery

## 2016-05-04 DIAGNOSIS — Z0279 Encounter for issue of other medical certificate: Secondary | ICD-10-CM

## 2016-05-07 ENCOUNTER — Encounter: Payer: Self-pay | Admitting: *Deleted

## 2016-05-23 DIAGNOSIS — S20361A Insect bite (nonvenomous) of right front wall of thorax, initial encounter: Secondary | ICD-10-CM | POA: Diagnosis not present

## 2016-05-23 DIAGNOSIS — L308 Other specified dermatitis: Secondary | ICD-10-CM | POA: Diagnosis not present

## 2016-06-06 ENCOUNTER — Encounter: Payer: Self-pay | Admitting: Vascular Surgery

## 2016-06-11 ENCOUNTER — Ambulatory Visit (HOSPITAL_COMMUNITY): Admit: 2016-06-11 | Payer: BLUE CROSS/BLUE SHIELD

## 2016-06-13 ENCOUNTER — Ambulatory Visit (HOSPITAL_COMMUNITY)
Admission: RE | Admit: 2016-06-13 | Discharge: 2016-06-13 | Disposition: A | Discharge status: Home / Self Care | Payer: BLUE CROSS/BLUE SHIELD | Source: Ambulatory Visit | Attending: Vascular Surgery | Admitting: Vascular Surgery

## 2016-06-13 DIAGNOSIS — L819 Disorder of pigmentation, unspecified: Secondary | ICD-10-CM | POA: Insufficient documentation

## 2016-06-13 DIAGNOSIS — Z95828 Presence of other vascular implants and grafts: Secondary | ICD-10-CM | POA: Diagnosis not present

## 2016-06-13 DIAGNOSIS — I739 Peripheral vascular disease, unspecified: Secondary | ICD-10-CM

## 2016-06-13 DIAGNOSIS — E785 Hyperlipidemia, unspecified: Secondary | ICD-10-CM | POA: Insufficient documentation

## 2016-06-15 ENCOUNTER — Encounter (HOSPITAL_COMMUNITY): Payer: BLUE CROSS/BLUE SHIELD

## 2016-06-19 ENCOUNTER — Ambulatory Visit (INDEPENDENT_AMBULATORY_CARE_PROVIDER_SITE_OTHER): Payer: BLUE CROSS/BLUE SHIELD | Admitting: Vascular Surgery

## 2016-06-19 ENCOUNTER — Ambulatory Visit (HOSPITAL_COMMUNITY)
Admit: 2016-06-19 | Discharge: 2016-06-19 | Disposition: A | Discharge status: Home / Self Care | Payer: BLUE CROSS/BLUE SHIELD | Attending: Vascular Surgery | Admitting: Vascular Surgery

## 2016-06-19 ENCOUNTER — Encounter: Payer: Self-pay | Admitting: Vascular Surgery

## 2016-06-19 VITALS — BP 122/75 | HR 71 | Temp 97.4°F | Resp 18 | Ht 62.0 in | Wt 164.6 lb

## 2016-06-19 DIAGNOSIS — I771 Stricture of artery: Secondary | ICD-10-CM | POA: Diagnosis not present

## 2016-06-19 DIAGNOSIS — L819 Disorder of pigmentation, unspecified: Secondary | ICD-10-CM | POA: Insufficient documentation

## 2016-06-19 DIAGNOSIS — Z95828 Presence of other vascular implants and grafts: Secondary | ICD-10-CM

## 2016-06-19 DIAGNOSIS — I739 Peripheral vascular disease, unspecified: Secondary | ICD-10-CM | POA: Insufficient documentation

## 2016-06-19 NOTE — Progress Notes (Signed)
Vascular and Vein Specialist of [facility]  Patient name: Brianna Powers MRN: [id number] DOB: November 18, 1965 Sex: female  REASON FOR VISIT: Follow-up bilateral redo covered iliac stents  HPI: Brianna Powers is a 51 y.o. female here for follow-up. She has done quite well since her procedure with Dr. Donzetta Matters on 05/02/2016 she has quit smoking as well. She does report she is again some weight related to this. I congratulated her on this achievement. She is walking without difficulty and has had no tissue loss in her feet  Past Medical History:  Diagnosis Date  . Diabetes mellitus without complication ([place])   . Hypothyroidism   . Peripheral vascular disease ([place])     Family History  Problem Relation Age of Onset  . CAD Father   . Diabetes type II Father     SOCIAL HISTORY: Social History  Substance Use Topics  . Smoking status: Former Smoker    Quit date: 05/25/2014  . Smokeless tobacco: Never Used  . Alcohol use No    Allergies  Allergen Reactions  . Amoxicillin Rash    Panic attacks.     Current Outpatient Prescriptions  Medication Sig Dispense Refill  . ACCU-CHEK AVIVA PLUS test strip TEST twice a day 50 each PRN  . ACCU-CHEK SOFTCLIX LANCETS lancets TEST twice a day 100 each PRN  . clopidogrel (PLAVIX) 75 MG tablet Take 1 tablet (75 mg total) by mouth daily. 30 tablet 3  . gabapentin (NEURONTIN) 100 MG capsule Take 100 mg by mouth 2 (two) times daily as needed. Reported on 05/24/2015  0  . glucose monitoring kit (FREESTYLE) monitoring kit 1 each by Does not apply route as needed for other. Use as directed 1 each 0  . metFORMIN (GLUCOPHAGE) 500 MG tablet Take 500 mg by mouth 2 (two) times daily with a meal.    . Multiple Vitamin (MULTIVITAMIN) tablet Take 1 tablet by mouth daily.    . phentermine 15 MG capsule Take 15 mg by mouth daily.  0  . SYNTHROID 100 MCG tablet take 1 tablet by mouth once daily 30 tablet 1  . traMADol (ULTRAM) 50  MG tablet Take 50 mg by mouth every 6 (six) hours as needed for moderate pain.     No current facility-administered medications for this visit.     REVIEW OF SYSTEMS:  [X]  denotes positive finding, [ ]  denotes negative finding Cardiac  Comments:  Chest pain or chest pressure:    Shortness of breath upon exertion:    Short of breath when lying flat:    Irregular heart rhythm:        Vascular    Pain in calf, thigh, or hip brought on by ambulation:    Pain in feet at night that wakes you up from your sleep:     Blood clot in your veins:    Leg swelling:           PHYSICAL EXAM: Vitals:   06/19/16 0849  BP: 122/75  Pulse: 71  Resp: 18  Temp: 97.4 F (36.3 C)  TempSrc: Oral  SpO2: 98%  Weight: 164 lb 9.6 oz (74.7 kg)  Height: 5' 2"  (1.575 m)    GENERAL: The patient is a well-nourished female, in no acute distress. The vital signs are documented above. CARDIOVASCULAR: Palpable femoral pulses. Her feet are cool and has does have a faint posterior tibial pulse. No tissue loss. PULMONARY: There is good air exchange  MUSCULOSKELETAL: There are no major deformities  or cyanosis. NEUROLOGIC: No focal weakness or paresthesias are detected. SKIN: There are no ulcers or rashes noted. PSYCHIATRIC: The patient has a normal affect.  DATA:  Noninvasive studies reveal normal ankle arm index bilateral. Duplex of her stents show no hemodynamically significant stenosis  MEDICAL ISSUES: She done quite well following the bilateral covered stenting of her distal aorta and iliac vessels. Begin congratulated on her smoking cessation. She will continue her walking program. We will see her again in 9 months with ankle arm indices and follow-up with our nurse practitioner. Assuming no difficulties and would be seen on an as-needed basis    Rosetta Posner, MD [facility] Vascular and Vein Specialists of [facility] Tel [phone] Pager [phone]

## 2016-06-20 NOTE — Addendum Note (Signed)
Addended by: Burton ApleyPETTY, Montzerrat Brunell A on: 06/20/2016 09:49 AM   Modules accepted: Orders

## 2016-08-06 ENCOUNTER — Inpatient Hospital Stay (HOSPITAL_COMMUNITY)
Admission: RE | Admit: 2016-08-06 | Discharge: 2016-08-06 | Disposition: A | Discharge status: Home / Self Care | Payer: BLUE CROSS/BLUE SHIELD | Source: Ambulatory Visit

## 2016-08-06 ENCOUNTER — Ambulatory Visit: Payer: BLUE CROSS/BLUE SHIELD | Admitting: Family

## 2016-08-06 DIAGNOSIS — Z959 Presence of cardiac and vascular implant and graft, unspecified: Secondary | ICD-10-CM

## 2016-08-06 DIAGNOSIS — I771 Stricture of artery: Secondary | ICD-10-CM

## 2016-09-05 DIAGNOSIS — L253 Unspecified contact dermatitis due to other chemical products: Secondary | ICD-10-CM | POA: Diagnosis not present

## 2016-12-10 IMAGING — DX DG CHEST 2V
2 series · 2 of 2 positions shown · non-contrast
Comparison: 01/23/2011.

CLINICAL DATA: Abdominal surgery.

EXAM:
CHEST  2 VIEW

[chest pa]
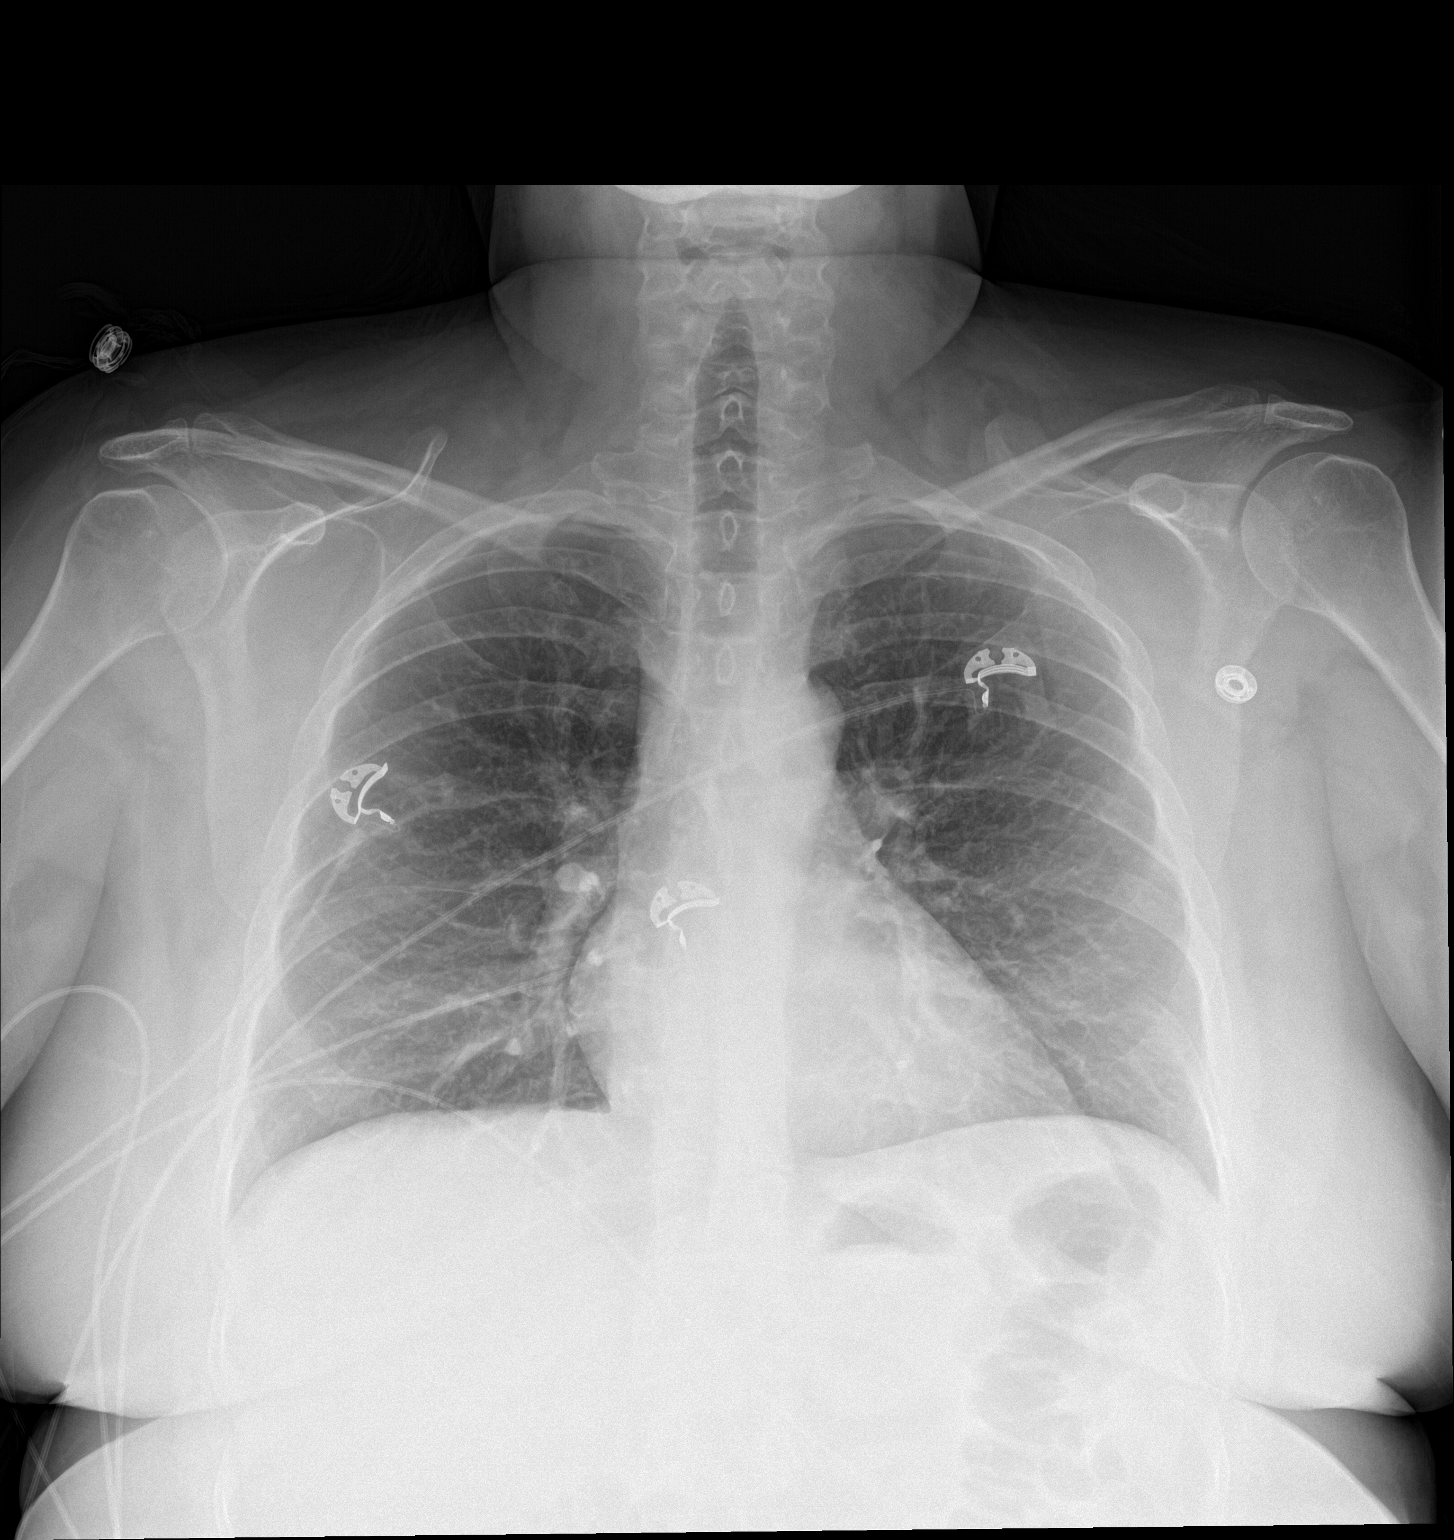

[chest lat]
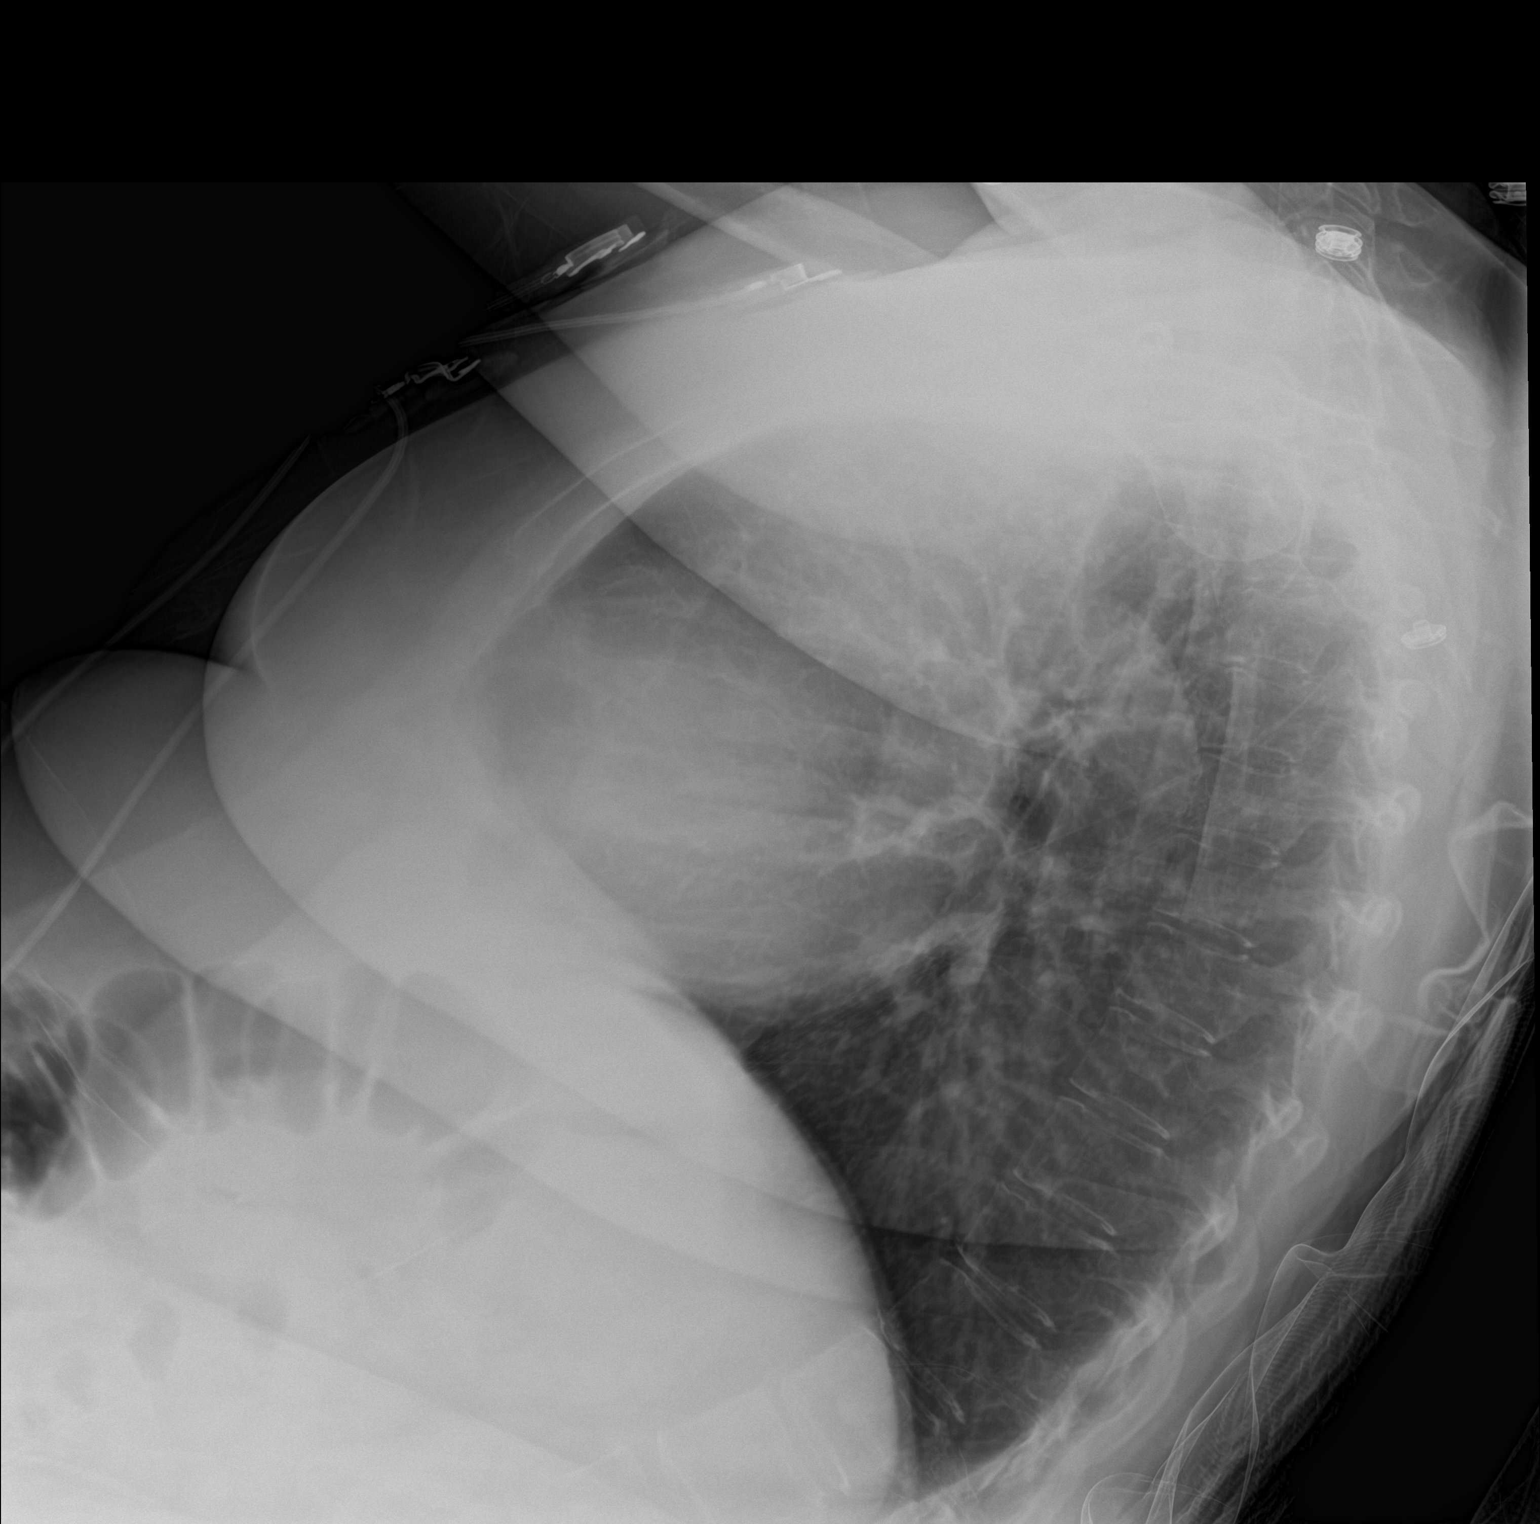

[2 of 2 positions shown; findings below may reference images not displayed]

FINDINGS: Mediastinum and hilar structures normal. The lungs are clear. Heart
size normal. No pleural effusion or pneumothorax. No acute bony
abnormality.
IMPRESSION: No acute cardiopulmonary disease.

## 2017-03-12 ENCOUNTER — Encounter (HOSPITAL_COMMUNITY): Payer: BLUE CROSS/BLUE SHIELD

## 2017-03-12 ENCOUNTER — Ambulatory Visit: Payer: BLUE CROSS/BLUE SHIELD | Admitting: Family

## 2017-05-14 ENCOUNTER — Encounter: Payer: Self-pay | Admitting: Family

## 2017-05-14 ENCOUNTER — Ambulatory Visit (HOSPITAL_COMMUNITY)
Admission: RE | Admit: 2017-05-14 | Discharge: 2017-05-14 | Disposition: A | Discharge status: Home / Self Care | Payer: BLUE CROSS/BLUE SHIELD | Source: Ambulatory Visit | Attending: Vascular Surgery | Admitting: Vascular Surgery

## 2017-05-14 ENCOUNTER — Ambulatory Visit: Payer: BLUE CROSS/BLUE SHIELD | Admitting: Family

## 2017-05-14 VITALS — BP 115/74 | HR 86 | Temp 98.6°F | Resp 18 | Ht 60.0 in | Wt 174.0 lb

## 2017-05-14 DIAGNOSIS — Z95828 Presence of other vascular implants and grafts: Secondary | ICD-10-CM

## 2017-05-14 DIAGNOSIS — Z87891 Personal history of nicotine dependence: Secondary | ICD-10-CM

## 2017-05-14 DIAGNOSIS — I771 Stricture of artery: Secondary | ICD-10-CM

## 2017-05-14 NOTE — Progress Notes (Addendum)
VASCULAR & VEIN SPECIALISTS OF [place]   CC: Follow up peripheral artery occlusive disease  History of Present Illness Brianna Powers is a 52 y.o. female who is s/p bilateral common iliac artery stenting with 7 x 59 VBX on 05-02-16 by Dr. Donzetta Matters for   instent restenosis of bilateral common iliac artery stents.  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 She underwent stenting, then had occlusion of the right common iliac stent with stenosis on the left.  Dr. Donnetta Hutching evaluated pt on 06-19-16. At that time there were normal ankle arm index bilaterally. Duplex of her stents showed no hemodynamically significant stenosis She had done quite well following the bilateral covered stenting of her distal aorta and iliac vessels. Dr. Donnetta Hutching congratulated her on her smoking cessation. Continue her walking program. Return in 9 months with ankle arm indices and follow-up with our nurse practitioner. Assuming no difficulties and would be seen on an as-needed basis.  She admits to not walking at all other than her ADL's, denies any barriers to walking.  She started a weight loss program yesterday, walked 15 minutes yesterday. Her entire legs felt tired from walking as she had not been walking.   Pt Diabetic: Yes, last A1C result on file was 6.2 in November 2016, pt states she no longer takes metformin, has had no issues with her sugars.  Pt smoker: quit in November 2017  Pt meds include: Statin :No Betablocker: No ASA: No Other anticoagulants/antiplatelets: Plavix   Past Medical History:  Diagnosis Date  . Diabetes mellitus without complication ([place])   . Hypothyroidism   . Peripheral vascular disease [facility])     Social History Social History   Tobacco Use  . Smoking status: Former Smoker    Last attempt to quit: 05/25/2014    Years since quitting: 2.9  . Smokeless tobacco: Never Used  Substance Use Topics  . Alcohol use: No   Alcohol/week: 0.0 oz  . Drug use: No    Family History Family History  Problem Relation Age of Onset  . CAD Father   . Diabetes type II Father     Past Surgical History:  Procedure Laterality Date  . ABDOMINAL AORTOGRAM N/A 05/02/2016   Procedure: Abdominal Aortogram;  Surgeon: Waynetta Sandy, MD;  Location: [place] [facility];  Service: Cardiovascular;  Laterality: N/A;  . FEMORAL ARTERY EXPLORATION Bilateral 01/28/2015   Procedure: BILATERAL FEMORAL ARTERY EXPLORATION;  Surgeon: Rosetta Posner, MD;  Location: [place];  Service: Vascular;  Laterality: Bilateral;  . INSERTION OF ILIAC STENT Bilateral 01/28/2015   Procedure: INSERTION OF RIGHT AND LEFT ILIAC STENT;  Surgeon: Rosetta Posner, MD;  Location: [place];  Service: Vascular;  Laterality: Bilateral;  . LOWER EXTREMITY ANGIOGRAPHY N/A 05/02/2016   Procedure: Bilateral Illac Stent;  Surgeon: Waynetta Sandy, MD;  Location: [place] [facility];  Service: Cardiovascular;  Laterality: N/A;  . PERIPHERAL VASCULAR CATHETERIZATION N/A 01/26/2015   Procedure: Abdominal Aortogram;  Surgeon: Rosetta Posner, MD;  Location: [place] [facility];  Service: Cardiovascular;  Laterality: N/A;  . PERIPHERAL VASCULAR CATHETERIZATION N/A 04/19/2016   Procedure: Abdominal Aortogram w/Lower Extremity;  Surgeon: Conrad , MD;  Location: [place] [facility];  Service: Cardiovascular;  Laterality: N/A;    Allergies  Allergen Reactions  . Amoxicillin Rash    Panic attacks.     Current Outpatient Medications  Medication Sig Dispense Refill  . clopidogrel (PLAVIX)  75 MG tablet Take 1 tablet (75 mg total) by mouth daily. 30 tablet 3  . phentermine 15 MG capsule Take 15 mg by mouth daily.  0  . ACCU-CHEK AVIVA PLUS test strip TEST twice a day (Patient not taking: Reported on 05/14/2017) 50 each PRN  . ACCU-CHEK SOFTCLIX LANCETS lancets TEST twice a day (Patient not taking: Reported on 05/14/2017) 100 each PRN  . gabapentin (NEURONTIN) 100 MG capsule  Take 100 mg by mouth 2 (two) times daily as needed. Reported on 05/24/2015  0  . glucose monitoring kit (FREESTYLE) monitoring kit 1 each by Does not apply route as needed for other. Use as directed (Patient not taking: Reported on 05/14/2017) 1 each 0  . metFORMIN (GLUCOPHAGE) 500 MG tablet Take 500 mg by mouth 2 (two) times daily with a meal.    . Multiple Vitamin (MULTIVITAMIN) tablet Take 1 tablet by mouth daily.    Marland Kitchen SYNTHROID 100 MCG tablet take 1 tablet by mouth once daily (Patient not taking: Reported on 05/14/2017) 30 tablet 1  . traMADol (ULTRAM) 50 MG tablet Take 50 mg by mouth every 6 (six) hours as needed for moderate pain.     No current facility-administered medications for this visit.     ROS: See HPI for pertinent positives and negatives.   Physical Examination  Vitals:   05/14/17 1542  BP: 115/74  Pulse: 86  Resp: 18  Temp: 98.6 F (37 C)  SpO2: 98%  Weight: 174 lb (78.9 kg)  Height: 5' (1.524 m)   Body mass index is 33.98 kg/m.  General: A&O x 3, WDWN, obese female. Gait: normal HENT: No gross abnormalities  Eyes: PERRLA. Pulmonary: Respirations are non labored, CTAB, good air movement in all fields Cardiac: regular rhythm, no detected murmur.    Carotid Bruits Right Left   Negative Negative   Abdominal aortic pulse is notpalpable. Radial pulses: 2+ palpable and equal  VASCULAR EXAM: Extremitieswithischemic changes: ruddy mottling in left foot and great toe, capillary refill in both great toes at 6 seconds, no cyanosis of her toes or feet.   WithoutGangrene; withoutopen wounds.   LE Pulses Right Left  FEMORAL notpalpable 1+palpable  POPLITEAL notpalpable notpalpable  POSTERIOR TIBIAL Not palpable notpalpable  DORSALIS PEDIS ANTERIOR TIBIAL Notpalpable Notpalpable   Abdomen: soft, NT, no palpable masses. Skin: no ulcers, see Extremities. Excoriation on right palm, is under  treatment.  Musculoskeletal: no muscle wasting or atrophy. Neurologic: A&O X 3; appropriate affect; SENSATION: normal; MOTOR FUNCTION: moving all extremities equally, motor strength 4/5 throughout. Speech is fluent/normal. CN 2-12 intact Psychiatric: Thought content is normal, mood appropriate for clinical situation.     ASSESSMENT: Brianna Powers is a 52 y.o. female s/p bilateral common iliac artery stenting with 7 x 59 VBX on 05-02-16 by Dr. Donzetta Matters for   instent restenosis of bilateral common iliac artery stents.   She is not taking a statin. Consider statin therapy which is associated with a greater reduction in CVD risk and improved endothelial function, will defer to PCP.  I discussed with Dr. Donnetta Hutching pt HPI, physical exam results, and decline in bilateral ABI, see Plan.  She may eventually need an aortobifemoral bypass graft, will try graduated walking program and medical management.  She has no claudication sx's with walking, but has not been walking much other than for ADL's.  She has a Network engineer job.   She does not have the cyanosis in her toes that she had before the last procedure.  I cannot palpate her right femoral pulse, left femoral is 1+palpable.  She is obese which hinders palpation of femoral pulses.   I congratulated her on remaining tobacco free.    DATA  ABI (Date: 05/14/2017):  R:   ABI: 0.73 (was 0.98 on 06-13-16),   PT: mono  DP: mono  TBI:  0.43 (was 0.53)  L:   ABI: 0.68 (was 1.07),   PT: bi  DP: mono  TBI: 0.31 (was 0.66)  Decline in bilateral ABI from normal to moderate disease in both legs, mono and biphasic waveforms. Decline in bilateral TBI.    PLAN:  Based on the patient's vascular studies and examination, and after discussing with Dr. Donnetta Hutching, pt will return to clinic in 6 months with ABI's and bilateral aortoiliac duplex.  I advised her to notify us if she develops concerns re the circulation in her feet or legs.   I stressed to  pt the importance of daily graduated walking program, discussed how to acheive.   I discussed in depth with the patient the nature of atherosclerosis, and emphasized the importance of maximal medical management including strict control of blood pressure, blood glucose, and lipid levels, obtaining regular exercise, and continued cessation of smoking.  The patient is aware that without maximal medical management the underlying atherosclerotic disease process will progress, limiting the benefit of any interventions.  The patient was given information about PAD including signs, symptoms, treatment, what symptoms should prompt the patient to seek immediate medical care, and risk reduction measures to take.  Clemon Chambers, RN, MSN, FNP-C Vascular and Vein Specialists of [employer] Phone: [phone]  Clinic MD: Early  05/14/17 4:06 PM

## 2017-05-14 NOTE — Patient Instructions (Addendum)
Before your next abdominal ultrasound:  Take two Extra-Strength Gas-X capsules at bedtime the night before the test. Take another two Extra-Strength Gas-X capsules 3 hours before the test.  Avoid gas forming foods the day before the test.       Peripheral Vascular Disease Peripheral vascular disease (PVD) is a disease of the blood vessels that are not part of your heart and brain. A simple term for PVD is poor circulation. In most cases, PVD narrows the blood vessels that carry blood from your heart to the rest of your body. This can result in a decreased supply of blood to your arms, legs, and internal organs, like your stomach or kidneys. However, it most often affects a person's lower legs and feet. There are two types of PVD.  Organic PVD. This is the more common type. It is caused by damage to the structure of blood vessels.  Functional PVD. This is caused by conditions that make blood vessels contract and tighten (spasm).  Without treatment, PVD tends to get worse over time. PVD can also lead to acute ischemic limb. This is when an arm or limb suddenly has trouble getting enough blood. This is a medical emergency. Follow these instructions at home:  Take medicines only as told by your doctor.  Do not use any tobacco products, including cigarettes, chewing tobacco, or electronic cigarettes. If you need help quitting, ask your doctor.  Lose weight if you are overweight, and maintain a healthy weight as told by your doctor.  Eat a diet that is low in fat and cholesterol. If you need help, ask your doctor.  Exercise regularly. Ask your doctor for some good activities for you.  Take good care of your feet. ? Wear comfortable shoes that fit well. ? Check your feet often for any cuts or sores. Contact a doctor if:  You have cramps in your legs while walking.  You have leg pain when you are at rest.  You have coldness in a leg or foot.  Your skin changes.  You are unable to  get or have an erection (erectile dysfunction).  You have cuts or sores on your feet that are not healing. Get help right away if:  Your arm or leg turns cold and blue.  Your arms or legs become red, warm, swollen, painful, or numb.  You have chest pain or trouble breathing.  You suddenly have weakness in your face, arm, or leg.  You become very confused or you cannot speak.  You suddenly have a very bad headache.  You suddenly cannot see. This information is not intended to replace advice given to you by your health care provider. Make sure you discuss any questions you have with your health care provider. Document Released: 06/06/2009 Document Revised: 08/18/2015 Document Reviewed: 08/20/2013 Elsevier Interactive Patient Education  2017 Elsevier Inc.  

## 2017-05-18 DIAGNOSIS — R5383 Other fatigue: Secondary | ICD-10-CM | POA: Diagnosis not present

## 2017-05-20 DIAGNOSIS — Z0000 Encounter for general adult medical examination without abnormal findings: Secondary | ICD-10-CM | POA: Diagnosis not present

## 2017-09-14 DIAGNOSIS — Z0000 Encounter for general adult medical examination without abnormal findings: Secondary | ICD-10-CM | POA: Diagnosis not present

## 2017-09-18 DIAGNOSIS — R7301 Impaired fasting glucose: Secondary | ICD-10-CM | POA: Diagnosis not present

## 2017-09-18 DIAGNOSIS — I739 Peripheral vascular disease, unspecified: Secondary | ICD-10-CM | POA: Diagnosis not present

## 2017-09-18 DIAGNOSIS — L309 Dermatitis, unspecified: Secondary | ICD-10-CM | POA: Diagnosis not present

## 2017-09-18 DIAGNOSIS — E782 Mixed hyperlipidemia: Secondary | ICD-10-CM | POA: Diagnosis not present

## 2017-10-21 ENCOUNTER — Other Ambulatory Visit: Payer: Self-pay

## 2017-10-21 DIAGNOSIS — I771 Stricture of artery: Secondary | ICD-10-CM

## 2017-10-21 DIAGNOSIS — Z95828 Presence of other vascular implants and grafts: Secondary | ICD-10-CM

## 2017-10-21 DIAGNOSIS — I739 Peripheral vascular disease, unspecified: Secondary | ICD-10-CM

## 2017-11-14 ENCOUNTER — Encounter: Payer: Self-pay | Admitting: Family

## 2017-11-14 ENCOUNTER — Encounter (HOSPITAL_COMMUNITY): Payer: BLUE CROSS/BLUE SHIELD

## 2017-11-14 ENCOUNTER — Ambulatory Visit (INDEPENDENT_AMBULATORY_CARE_PROVIDER_SITE_OTHER)
Admission: RE | Admit: 2017-11-14 | Discharge: 2017-11-14 | Disposition: A | Discharge status: Home / Self Care | Payer: BLUE CROSS/BLUE SHIELD | Source: Ambulatory Visit | Attending: Vascular Surgery | Admitting: Vascular Surgery

## 2017-11-14 ENCOUNTER — Ambulatory Visit (HOSPITAL_COMMUNITY)
Admission: RE | Admit: 2017-11-14 | Discharge: 2017-11-14 | Disposition: A | Discharge status: Home / Self Care | Payer: BLUE CROSS/BLUE SHIELD | Source: Ambulatory Visit | Attending: Vascular Surgery | Admitting: Vascular Surgery

## 2017-11-14 ENCOUNTER — Ambulatory Visit: Payer: BLUE CROSS/BLUE SHIELD | Admitting: Family

## 2017-11-14 DIAGNOSIS — I771 Stricture of artery: Secondary | ICD-10-CM | POA: Diagnosis not present

## 2017-11-14 DIAGNOSIS — Z95828 Presence of other vascular implants and grafts: Secondary | ICD-10-CM | POA: Diagnosis not present

## 2017-11-14 DIAGNOSIS — I739 Peripheral vascular disease, unspecified: Secondary | ICD-10-CM | POA: Diagnosis not present

## 2017-11-18 ENCOUNTER — Encounter: Payer: Self-pay | Admitting: Family

## 2017-11-18 ENCOUNTER — Encounter: Payer: Self-pay | Admitting: *Deleted

## 2017-11-18 ENCOUNTER — Ambulatory Visit (INDEPENDENT_AMBULATORY_CARE_PROVIDER_SITE_OTHER): Payer: BLUE CROSS/BLUE SHIELD | Admitting: Family

## 2017-11-18 VITALS — BP 140/84 | HR 74 | Temp 97.0°F | Resp 18 | Ht 60.0 in | Wt 173.0 lb

## 2017-11-18 DIAGNOSIS — Z87891 Personal history of nicotine dependence: Secondary | ICD-10-CM | POA: Diagnosis not present

## 2017-11-18 DIAGNOSIS — T82856D Stenosis of peripheral vascular stent, subsequent encounter: Secondary | ICD-10-CM | POA: Diagnosis not present

## 2017-11-18 DIAGNOSIS — I771 Stricture of artery: Secondary | ICD-10-CM

## 2017-11-18 DIAGNOSIS — Z95828 Presence of other vascular implants and grafts: Secondary | ICD-10-CM | POA: Diagnosis not present

## 2017-11-18 NOTE — Progress Notes (Signed)
VASCULAR & VEIN SPECIALISTS OF [place]   CC: Follow up peripheral artery occlusive disease  History of Present Illness Brianna Powers is a 52 y.o. female who is s/p bilateral common iliac artery stenting with 7 x 59 VBX on 05-02-16 by Dr. Donzetta Matters for  instent restenosis of bilateral common iliac artery stents.  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 She underwent stenting, then had occlusion of the right common iliac stent with stenosis on the left.  Dr. Donnetta Hutching evaluated pt on 06-19-16. At that time there were normal ankle arm index bilaterally. Duplex of her stents showed no hemodynamically significant stenosis She had done quite well following the bilateral covered stenting of her distal aorta and iliac vessels. Dr. Donnetta Hutching congratulated her on her smoking cessation. Continue her walking program. Return in 9 months with ankle arm indices and follow-up with our nurse practitioner. Assuming no difficulties and would be seen on an as-needed basis.  She admits to not walking at all other than her ADL's. 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  Bilateral buttock pain stated about a month ago with walking.   About a month ago, she started feeling dizzy when supine, this occurs occasionally.  Diabetic: Yes, last A1C result on file was 6.2 in November 2016 Tobacco use: quit inNovember2017  Pt meds include: Statin : yes, Crestor 5 mg (not on med list) Betablocker: No ASA: No Other anticoagulants/antiplatelets: Plavix    Past Medical History:  Diagnosis Date  . Diabetes mellitus without complication ([place])   . Hypothyroidism   . Peripheral vascular disease [facility])     Social History Social History   Tobacco Use  . Smoking status: Former Smoker    Last attempt to quit: 05/25/2014    Years since quitting: 3.4  . Smokeless tobacco: Never Used  Substance Use  Topics  . Alcohol use: No    Alcohol/week: 0.0 standard drinks  . Drug use: No    Family History Family History  Problem Relation Age of Onset  . CAD Father   . Diabetes type II Father     Past Surgical History:  Procedure Laterality Date  . ABDOMINAL AORTOGRAM N/A 05/02/2016   Procedure: Abdominal Aortogram;  Surgeon: Waynetta Sandy, MD;  Location: [place] [facility];  Service: Cardiovascular;  Laterality: N/A;  . FEMORAL ARTERY EXPLORATION Bilateral 01/28/2015   Procedure: BILATERAL FEMORAL ARTERY EXPLORATION;  Surgeon: Rosetta Posner, MD;  Location: [place];  Service: Vascular;  Laterality: Bilateral;  . INSERTION OF ILIAC STENT Bilateral 01/28/2015   Procedure: INSERTION OF RIGHT AND LEFT ILIAC STENT;  Surgeon: Rosetta Posner, MD;  Location: [place];  Service: Vascular;  Laterality: Bilateral;  . LOWER EXTREMITY ANGIOGRAPHY N/A 05/02/2016   Procedure: Bilateral Illac Stent;  Surgeon: Waynetta Sandy, MD;  Location: [place] [facility];  Service: Cardiovascular;  Laterality: N/A;  . PERIPHERAL VASCULAR CATHETERIZATION N/A 01/26/2015   Procedure: Abdominal Aortogram;  Surgeon: Rosetta Posner, MD;  Location: [place] [facility];  Service: Cardiovascular;  Laterality: N/A;  . PERIPHERAL VASCULAR CATHETERIZATION N/A 04/19/2016   Procedure: Abdominal Aortogram w/Lower Extremity;  Surgeon: Conrad Waubun, MD;  Location: [place] [facility];  Service: Cardiovascular;  Laterality: N/A;    Allergies  Allergen Reactions  . Amoxicillin Rash    Panic attacks.     Current Outpatient Medications  Medication  Sig Dispense Refill  . ACCU-CHEK AVIVA PLUS test strip TEST twice a day 50 each PRN  . ACCU-CHEK SOFTCLIX LANCETS lancets TEST twice a day 100 each PRN  . clopidogrel (PLAVIX) 75 MG tablet Take 1 tablet (75 mg total) by mouth daily. 30 tablet 3  . gabapentin (NEURONTIN) 100 MG capsule Take 100 mg by mouth 2 (two) times daily as needed. Reported on 05/24/2015  0  . glucose monitoring kit  (FREESTYLE) monitoring kit 1 each by Does not apply route as needed for other. Use as directed 1 each 0  . metFORMIN (GLUCOPHAGE) 500 MG tablet Take 500 mg by mouth 2 (two) times daily with a meal.    . Multiple Vitamin (MULTIVITAMIN) tablet Take 1 tablet by mouth daily.    . phentermine 15 MG capsule Take 15 mg by mouth daily.  0  . SYNTHROID 100 MCG tablet take 1 tablet by mouth once daily 30 tablet 1  . traMADol (ULTRAM) 50 MG tablet Take 50 mg by mouth every 6 (six) hours as needed for moderate pain.     No current facility-administered medications for this visit.     ROS: See HPI for pertinent positives and negatives.   Physical Examination  Vitals:   11/18/17 1505  BP: 140/84  Pulse: 74  Resp: 18  Temp: (!) 97 F (36.1 C)  TempSrc: Oral  SpO2: 98%  Weight: 173 lb (78.5 kg)  Height: 5' (1.524 m)   Body mass index is 33.79 kg/m.  General: A&O x 3, WDWN, obese female. Gait: normal HENT: No gross abnormalities  Eyes: PERRLA. Pulmonary: Respirations are non labored, CTAB, good air movement in all fields Cardiac: regular rhythm, no detected murmur.    Carotid Bruits Right Left   Negative Negative   Abdominal aortic pulse is notpalpable. Radial pulses: 2+ palpable and equal  VASCULAR EXAM: Extremitieswithischemic changes:cyanosis in all toe bilaterally, all toes are cool to touch. WithoutGangrene; withoutopen wounds.   LE Pulses Right Left  FEMORAL notpalpable notpalpable  POPLITEAL notpalpable notpalpable  POSTERIOR TIBIAL Not palpable notpalpable  DORSALIS PEDIS ANTERIOR TIBIAL Notpalpable Notpalpable   Abdomen: soft, NT, no palpable masses.  Skin: no ulcers, see Extremities.  Musculoskeletal: no muscle wasting or atrophy. Neurologic: A&O X 3; appropriate affect; SENSATION: normal; MOTOR FUNCTION: moving all extremities equally, motor strength 4/5 throughout. Speech is  fluent/normal. CN 2-12 intact Psychiatric: Thought content is normal, mood appropriate for clinical situation.     ASSESSMENT: Brianna Powers is a 52 y.o. female who is s/p bilateral common iliac artery stenting with 7 x 59 VBX on 05-02-16 by Dr. Donzetta Matters for instent restenosis of bilateral common iliac artery stents.   At her 05-14-17 visit, I discussed with Dr. Donnetta Hutching pt HPI, physical exam results, and decline in bilateral ABI. She may eventually need an aortobifemoral bypass graft, will try graduated walking program and medical management.   She developed bilateral buttock claudication about a month ago. She has a Network engineer job.   The cyanosis in all of her toes has returned, but she states that this resolves when her feet warm up.  I cannot palpate either femoral pulse.  She is obese which hinders palpation of femoral pulses.   She denies any history of cardiac problems, denies any renal problems. Serum creatinine was 0.6 on 05-03-16.   I congratulated her on remaining tobacco free.  Dr. Donzetta Matters spoke with and examined pt.  Will schedule arteriogram with bilateral run off, possible intervention, by Dr. Donzetta Matters.  DATA  Bilateral Iliac Artery Stent Duplex (11-15-17): Right: Proximal stent: 438 cm/s, mid stent: 403 cm/s Left: Proximal stent: 413 cm/s   ABI (Date: 11-15-17):  R:   ABI: 0.68 (was 0.73 on 05-14-17),   PT: mono (was mono)  DP: mono (was mono)  TBI:  0.00, toe pressure 0 (was 0.43)  L:   ABI: 0.79 (was 0.68),   PT: mono (was bi)  DP: mono (was mono)  TBI: 0.00, toe pressure 0 (was 0.31) Decline in bilateral TBI and toe pressures to 0.     PLAN:  Will schedule arteriogram with bilateral run off, possible intervention, by Dr. Donzetta Matters.    I discussed in depth with the patient the nature of atherosclerosis, and emphasized the importance of maximal medical management including strict control of blood pressure, blood glucose, and lipid levels, obtaining regular  exercise, and continued cessation of smoking.  The patient is aware that without maximal medical management the underlying atherosclerotic disease process will progress, limiting the benefit of any interventions.  The patient was given information about PAD including signs, symptoms, treatment, what symptoms should prompt the patient to seek immediate medical care, and risk reduction measures to take.  Clemon Chambers, RN, MSN, FNP-C [facility] [employer] Phone: [phone]  Clinic MD: Idalia Needle  11/18/17 3:28 PM

## 2017-11-18 NOTE — Patient Instructions (Signed)

## 2017-11-19 ENCOUNTER — Other Ambulatory Visit: Payer: Self-pay | Admitting: *Deleted

## 2017-11-28 ENCOUNTER — Encounter (HOSPITAL_COMMUNITY)
Admission: RE | Disposition: A | Discharge status: Home / Self Care | Payer: Self-pay | Source: Ambulatory Visit | Attending: Vascular Surgery

## 2017-11-28 ENCOUNTER — Ambulatory Visit (HOSPITAL_COMMUNITY)
Admission: RE | Admit: 2017-11-28 | Discharge: 2017-11-28 | Disposition: A | Discharge status: Home / Self Care | Payer: BLUE CROSS/BLUE SHIELD | Source: Ambulatory Visit | Attending: Vascular Surgery | Admitting: Vascular Surgery

## 2017-11-28 ENCOUNTER — Other Ambulatory Visit: Payer: Self-pay

## 2017-11-28 DIAGNOSIS — Z7902 Long term (current) use of antithrombotics/antiplatelets: Secondary | ICD-10-CM | POA: Insufficient documentation

## 2017-11-28 DIAGNOSIS — Z7984 Long term (current) use of oral hypoglycemic drugs: Secondary | ICD-10-CM | POA: Diagnosis not present

## 2017-11-28 DIAGNOSIS — E669 Obesity, unspecified: Secondary | ICD-10-CM | POA: Insufficient documentation

## 2017-11-28 DIAGNOSIS — Z8249 Family history of ischemic heart disease and other diseases of the circulatory system: Secondary | ICD-10-CM | POA: Insufficient documentation

## 2017-11-28 DIAGNOSIS — Z6833 Body mass index (BMI) 33.0-33.9, adult: Secondary | ICD-10-CM | POA: Diagnosis not present

## 2017-11-28 DIAGNOSIS — Z9889 Other specified postprocedural states: Secondary | ICD-10-CM | POA: Insufficient documentation

## 2017-11-28 DIAGNOSIS — Y831 Surgical operation with implant of artificial internal device as the cause of abnormal reaction of the patient, or of later complication, without mention of misadventure at the time of the procedure: Secondary | ICD-10-CM | POA: Diagnosis not present

## 2017-11-28 DIAGNOSIS — Z79890 Hormone replacement therapy (postmenopausal): Secondary | ICD-10-CM | POA: Diagnosis not present

## 2017-11-28 DIAGNOSIS — Z833 Family history of diabetes mellitus: Secondary | ICD-10-CM | POA: Diagnosis not present

## 2017-11-28 DIAGNOSIS — Z79899 Other long term (current) drug therapy: Secondary | ICD-10-CM | POA: Diagnosis not present

## 2017-11-28 DIAGNOSIS — Z87891 Personal history of nicotine dependence: Secondary | ICD-10-CM | POA: Insufficient documentation

## 2017-11-28 DIAGNOSIS — Z880 Allergy status to penicillin: Secondary | ICD-10-CM | POA: Diagnosis not present

## 2017-11-28 DIAGNOSIS — E039 Hypothyroidism, unspecified: Secondary | ICD-10-CM | POA: Insufficient documentation

## 2017-11-28 DIAGNOSIS — E1151 Type 2 diabetes mellitus with diabetic peripheral angiopathy without gangrene: Secondary | ICD-10-CM | POA: Diagnosis not present

## 2017-11-28 DIAGNOSIS — I70213 Atherosclerosis of native arteries of extremities with intermittent claudication, bilateral legs: Secondary | ICD-10-CM | POA: Diagnosis not present

## 2017-11-28 DIAGNOSIS — T82856A Stenosis of peripheral vascular stent, initial encounter: Secondary | ICD-10-CM | POA: Diagnosis not present

## 2017-11-28 HISTORY — PX: ABDOMINAL AORTOGRAM W/LOWER EXTREMITY: CATH118223

## 2017-11-28 HISTORY — PX: PERIPHERAL VASCULAR INTERVENTION: CATH118257

## 2017-11-28 LAB — POCT I-STAT, CHEM 8
BUN: 18 mg/dL (ref 6–20)
CALCIUM ION: 0.92 mmol/L — AB (ref 1.15–1.40)
CHLORIDE: 114 mmol/L — AB (ref 98–111)
Creatinine, Ser: 0.4 mg/dL — ABNORMAL LOW (ref 0.44–1.00)
GLUCOSE: 99 mg/dL (ref 70–99)
HCT: 45 % (ref 36.0–46.0)
Hemoglobin: 15.3 g/dL — ABNORMAL HIGH (ref 12.0–15.0)
Potassium: 4.9 mmol/L (ref 3.5–5.1)
Sodium: 136 mmol/L (ref 135–145)
TCO2: 17 mmol/L — ABNORMAL LOW (ref 22–32)

## 2017-11-28 LAB — POCT ACTIVATED CLOTTING TIME: Activated Clotting Time: 268 seconds

## 2017-11-28 LAB — GLUCOSE, CAPILLARY: GLUCOSE-CAPILLARY: 92 mg/dL (ref 70–99)

## 2017-11-28 SURGERY — ABDOMINAL AORTOGRAM W/LOWER EXTREMITY
Anesthesia: LOCAL

## 2017-11-28 MED ORDER — IODIXANOL 320 MG/ML IV SOLN
INTRAVENOUS | Status: DC | PRN
  Administered 2017-11-28: 145 mL via INTRA_ARTERIAL

## 2017-11-28 MED ORDER — ACETAMINOPHEN 325 MG PO TABS: 650.0000 mg | ORAL_TABLET | Freq: Four times a day (QID) | ORAL | Status: DC | PRN

## 2017-11-28 MED ORDER — SODIUM CHLORIDE 0.9% FLUSH: 3.0000 mL | INTRAVENOUS | Status: DC | PRN

## 2017-11-28 MED ORDER — FENTANYL CITRATE (PF) 100 MCG/2ML IJ SOLN
INTRAMUSCULAR | Status: AC
  Filled 2017-11-28: qty 2

## 2017-11-28 MED ORDER — MIDAZOLAM HCL 2 MG/2ML IJ SOLN
INTRAMUSCULAR | Status: DC | PRN
  Administered 2017-11-28 (×2): 1 mg via INTRAVENOUS

## 2017-11-28 MED ORDER — HYDRALAZINE HCL 20 MG/ML IJ SOLN: 5.0000 mg | INTRAMUSCULAR | Status: DC | PRN

## 2017-11-28 MED ORDER — SODIUM CHLORIDE 0.9 % IV SOLN
INTRAVENOUS | Status: DC
  Administered 2017-11-28: 13:00:00 via INTRAVENOUS

## 2017-11-28 MED ORDER — SODIUM CHLORIDE 0.9% FLUSH: 3.0000 mL | Freq: Two times a day (BID) | INTRAVENOUS | Status: DC

## 2017-11-28 MED ORDER — SODIUM CHLORIDE 0.9 % WEIGHT BASED INFUSION: 1.0000 mL/kg/h | INTRAVENOUS | Status: DC

## 2017-11-28 MED ORDER — HEPARIN (PORCINE) IN NACL 1000-0.9 UT/500ML-% IV SOLN
INTRAVENOUS | Status: DC | PRN
  Administered 2017-11-28: 500 mL

## 2017-11-28 MED ORDER — LABETALOL HCL 5 MG/ML IV SOLN: 10.0000 mg | INTRAVENOUS | Status: DC | PRN

## 2017-11-28 MED ORDER — LIDOCAINE HCL (PF) 1 % IJ SOLN
INTRAMUSCULAR | Status: AC
  Filled 2017-11-28: qty 30

## 2017-11-28 MED ORDER — ACETAMINOPHEN 325 MG PO TABS
650.0000 mg | ORAL_TABLET | ORAL | Status: DC | PRN
  Administered 2017-11-28: 650 mg via ORAL
  Filled 2017-11-28: qty 2

## 2017-11-28 MED ORDER — LIDOCAINE HCL (PF) 1 % IJ SOLN
INTRAMUSCULAR | Status: DC | PRN
  Administered 2017-11-28 (×2): 15 mL

## 2017-11-28 MED ORDER — ASPIRIN 81 MG PO CHEW
CHEWABLE_TABLET | ORAL | Status: AC
  Administered 2017-11-28: 81 mg
  Filled 2017-11-28: qty 1

## 2017-11-28 MED ORDER — ASPIRIN EC 81 MG PO TBEC
81.0000 mg | DELAYED_RELEASE_TABLET | Freq: Every day | ORAL | Status: DC
  Administered 2017-11-28: 81 mg via ORAL

## 2017-11-28 MED ORDER — HEPARIN SODIUM (PORCINE) 1000 UNIT/ML IJ SOLN
INTRAMUSCULAR | Status: DC | PRN
  Administered 2017-11-28: 8000 [IU] via INTRAVENOUS

## 2017-11-28 MED ORDER — HEPARIN SODIUM (PORCINE) 1000 UNIT/ML IJ SOLN
INTRAMUSCULAR | Status: AC
  Filled 2017-11-28: qty 1

## 2017-11-28 MED ORDER — HEPARIN (PORCINE) IN NACL 1000-0.9 UT/500ML-% IV SOLN
INTRAVENOUS | Status: AC
  Filled 2017-11-28: qty 1000

## 2017-11-28 MED ORDER — FENTANYL CITRATE (PF) 100 MCG/2ML IJ SOLN
INTRAMUSCULAR | Status: DC | PRN
  Administered 2017-11-28: 50 ug via INTRAVENOUS
  Administered 2017-11-28 (×2): 25 ug via INTRAVENOUS

## 2017-11-28 MED ORDER — MIDAZOLAM HCL 2 MG/2ML IJ SOLN
INTRAMUSCULAR | Status: AC
  Filled 2017-11-28: qty 2

## 2017-11-28 MED ORDER — SODIUM CHLORIDE 0.9 % IV SOLN: 250.0000 mL | INTRAVENOUS | Status: DC | PRN

## 2017-11-28 MED ORDER — ONDANSETRON HCL 4 MG/2ML IJ SOLN: 4.0000 mg | Freq: Four times a day (QID) | INTRAMUSCULAR | Status: DC | PRN

## 2017-11-28 SURGICAL SUPPLY — 22 items
CATH BEACON 5 .035 65 KMP TIP (CATHETERS) ×3 IMPLANT
CATH OMNI FLUSH 5F 65CM (CATHETERS) ×3 IMPLANT
DEVICE CLOSURE MYNXGRIP 6/7F (Vascular Products) ×6 IMPLANT
GUIDEWIRE ANGLED .035X150CM (WIRE) ×3 IMPLANT
KIT ENCORE 26 ADVANTAGE (KITS) ×12 IMPLANT
KIT MICROPUNCTURE NIT STIFF (SHEATH) ×6 IMPLANT
KIT PV (KITS) ×3 IMPLANT
SHEATH BRITE TIP 7FR 35CM (SHEATH) ×6 IMPLANT
SHEATH PINNACLE 5F 10CM (SHEATH) ×6 IMPLANT
SHEATH PINNACLE 6F 10CM (SHEATH) ×3 IMPLANT
SHEATH PINNACLE 7F 10CM (SHEATH) ×6 IMPLANT
SHEATH PINNACLE 8F 10CM (SHEATH) ×3 IMPLANT
SHEATH PROBE COVER 6X72 (BAG) ×3 IMPLANT
STENT VIABAHN VBX 7X59X80 (Permanent Stent) ×3 IMPLANT
STENT VIABAHNBX 7X79X80 (Permanent Stent) ×3 IMPLANT
SYR MEDRAD MARK V 150ML (SYRINGE) ×3 IMPLANT
TRANSDUCER W/STOPCOCK (MISCELLANEOUS) ×3 IMPLANT
TRAY PV CATH (CUSTOM PROCEDURE TRAY) ×3 IMPLANT
WIRE AMPLATZ SS-J .035X180CM (WIRE) ×3 IMPLANT
WIRE BENTSON .035X145CM (WIRE) ×6 IMPLANT
WIRE SPARTACORE .014X190CM (WIRE) ×3 IMPLANT
WIRE TORQFLEX AUST .018X40CM (WIRE) ×3 IMPLANT

## 2017-11-28 NOTE — H&P (Signed)
History and Physical Interval Note:  11/28/2017 2:48 PM  Brianna Powers  has presented today for surgery, with the diagnosis of pvd claudication  The various methods of treatment have been discussed with the patient and family. After consideration of risks, benefits and other options for treatment, the patient has consented to  Procedure(s): ABDOMINAL AORTOGRAM W/LOWER EXTREMITY (N/A) as a surgical intervention .  The patient's history has been reviewed, patient examined, no change in status, stable for surgery.  I have reviewed the patient's chart and labs.  Questions were answered to the patient's satisfaction.    Aortogram with bilateral extremity run-off.  Concern for common iliac artery in-stent restenosis.  Brianna Powers   CC: Follow up peripheral artery occlusive disease  History of Present Illness Brianna Powers is a 52 y.o. female who is s/p bilateral common iliac artery stenting with 7 x 59 VBXon 05-02-16 by Dr. Donzetta Matters for instent restenosis of bilateral common iliac artery stents.  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 She underwent stenting, then hadocclusion of the right common iliac stent with stenosis on the left.  Dr. Donnetta Hutching evaluated pt on 06-19-16. At that time there werenormal ankle arm index bilaterally. Duplex of her stents showedno hemodynamically significant stenosis Shehaddone quite well following the bilateral covered stenting of her distal aorta and iliac vessels.Dr. Earlycongratulated heron her smoking cessation. Continue her walking program.Returnin 9 months with ankle arm indices and follow-up with our nurse practitioner. Assuming no difficulties and would be seen on an as-needed basis.  She admits to not walking at all other than her ADL's. 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  Bilateral buttock pain stated  about a month ago with walking.   About a month ago, she started feeling dizzy when supine, this occurs occasionally.  Diabetic: Yes, last A1C result on file was 6.2 in November 2016 Tobacco use: quit inNovember2017  Pt meds include: Statin : yes, Crestor 5 mg (not on med list) Betablocker: No ASA: No Other anticoagulants/antiplatelets: Plavix        Past Medical History:  Diagnosis Date  . Diabetes mellitus without complication ([place])   . Hypothyroidism   . Peripheral vascular disease [facility])     Social History Social History        Tobacco Use  . Smoking status: Former Smoker    Last attempt to quit: 05/25/2014    Years since quitting: 3.4  . Smokeless tobacco: Never Used  Substance Use Topics  . Alcohol use: No    Alcohol/week: 0.0 standard drinks  . Drug use: No    Family History      Family History  Problem Relation Age of Onset  . CAD Father   . Diabetes type II Father          Past Surgical History:  Procedure Laterality Date  . ABDOMINAL AORTOGRAM N/A 05/02/2016   Procedure: Abdominal Aortogram;  Surgeon: Waynetta Sandy, MD;  Location: [place] [facility];  Service: Cardiovascular;  Laterality: N/A;  . FEMORAL ARTERY EXPLORATION Bilateral 01/28/2015   Procedure: BILATERAL FEMORAL ARTERY EXPLORATION;  Surgeon: Rosetta Posner, MD;  Location: [place];  Service: Vascular;  Laterality: Bilateral;  . INSERTION OF ILIAC STENT Bilateral 01/28/2015   Procedure: INSERTION OF RIGHT AND LEFT ILIAC STENT;  Surgeon: Rosetta Posner, MD;  Location: [place];  Service: Vascular;  Laterality:  Bilateral;  . LOWER EXTREMITY ANGIOGRAPHY N/A 05/02/2016   Procedure: Bilateral Illac Stent;  Surgeon: Waynetta Sandy, MD;  Location: [place] [facility];  Service: Cardiovascular;  Laterality: N/A;  . PERIPHERAL VASCULAR CATHETERIZATION N/A 01/26/2015   Procedure: Abdominal Aortogram;  Surgeon: Rosetta Posner, MD;  Location: [place] [facility];  Service:  Cardiovascular;  Laterality: N/A;  . PERIPHERAL VASCULAR CATHETERIZATION N/A 04/19/2016   Procedure: Abdominal Aortogram w/Lower Extremity;  Surgeon: Conrad Elliott, MD;  Location: [place] [facility];  Service: Cardiovascular;  Laterality: N/A;         Allergies  Allergen Reactions  . Amoxicillin Rash    Panic attacks.           Current Outpatient Medications  Medication Sig Dispense Refill  . ACCU-CHEK AVIVA PLUS test strip TEST twice a day 50 each PRN  . ACCU-CHEK SOFTCLIX LANCETS lancets TEST twice a day 100 each PRN  . clopidogrel (PLAVIX) 75 MG tablet Take 1 tablet (75 mg total) by mouth daily. 30 tablet 3  . gabapentin (NEURONTIN) 100 MG capsule Take 100 mg by mouth 2 (two) times daily as needed. Reported on 05/24/2015  0  . glucose monitoring kit (FREESTYLE) monitoring kit 1 each by Does not apply route as needed for other. Use as directed 1 each 0  . metFORMIN (GLUCOPHAGE) 500 MG tablet Take 500 mg by mouth 2 (two) times daily with a meal.    . Multiple Vitamin (MULTIVITAMIN) tablet Take 1 tablet by mouth daily.    . phentermine 15 MG capsule Take 15 mg by mouth daily.  0  . SYNTHROID 100 MCG tablet take 1 tablet by mouth once daily 30 tablet 1  . traMADol (ULTRAM) 50 MG tablet Take 50 mg by mouth every 6 (six) hours as needed for moderate pain.     No current facility-administered medications for this visit.     ROS: See HPI for pertinent positives and negatives.   Physical Examination     Vitals:   11/18/17 1505  BP: 140/84  Pulse: 74  Resp: 18  Temp: (!) 97 F (36.1 C)  TempSrc: Oral  SpO2: 98%  Weight: 173 lb (78.5 kg)  Height: 5' (1.524 m)   Body mass index is 33.79 kg/m.  General: A&O x 3, WDWN, obesefemale. Gait: normal HENT: No gross abnormalities Eyes: PERRLA. Pulmonary: Respirations are non labored, CTAB, good air movementin all fields Cardiac: regular rhythm, no detected murmur.    Carotid Bruits Right Left    Negative Negative   Abdominal aortic pulseis notpalpable. Radial pulses:2+ palpable and equal  VASCULAR EXAM: Extremitieswithischemic changes:cyanosis in all toe bilaterally, all toes are cool to touch. WithoutGangrene; withoutopen wounds.   LE Pulses Right Left  FEMORAL notpalpable notpalpable  POPLITEAL notpalpable notpalpable  POSTERIOR TIBIAL Not palpable notpalpable  DORSALIS PEDIS ANTERIOR TIBIAL Notpalpable Notpalpable   Abdomen: soft, NT, no palpable masses.  Skin: no ulcers, see Extremities. Musculoskeletal:no muscle wasting or atrophy. Neurologic: A&O X 3;appropriateaffect; SENSATION: normal; MOTOR FUNCTION: moving all extremities equally, motor strength 4/5 throughout. Speech is fluent/normal. CN 2-12 intact Psychiatric: Thought content is normal, mood appropriate for clinical situation.     ASSESSMENT: Brianna Powers is a 52 y.o. female who is s/p bilateral common iliac artery stenting with 7 x 59 VBXon 05-02-16 by Dr. Donzetta Matters forinstent restenosis of bilateral common iliac artery stents.   At her 05-14-17 visit, I discussed with Dr. Donnetta Hutching pt HPI, physical exam results, and decline in bilateral  ABI. She may eventually need an aortobifemoral bypass graft, will try graduated walking program and medical management.   She developed bilateral buttock claudication about a month ago. She has a Network engineer job.   The cyanosis in all of her toes has returned, but she states that this resolves when her feet warm up.  I cannot palpate either femoral pulse.  She is obese which hinders palpation of femoral pulses.   She denies any history of cardiac problems, denies any renal problems. Serum creatinine was 0.6 on 05-03-16.   I congratulated her on remaining tobacco free.  Dr. Donzetta Matters spoke with and examined pt.  Will schedule arteriogram with bilateral run off, possible intervention, by Dr. Donzetta Matters.     DATA  Bilateral Iliac Artery Stent Duplex (11-15-17): Right: Proximal stent: 438 cm/s, mid stent: 403 cm/s Left: Proximal stent: 413 cm/s   ABI (Date: 11-15-17):  R:  ? ABI: 0.68 (was 0.73 on 05-14-17),  ? PT: mono (was mono) ? DP: mono (was mono) ? TBI:  0.00, toe pressure 0 (was 0.43)  L:  ? ABI: 0.79 (was 0.68),  ? PT: mono (was bi) ? DP: mono (was mono) ? TBI: 0.00, toe pressure 0 (was 0.31) Decline in bilateral TBI and toe pressures to 0.     PLAN:  Will schedule arteriogram with bilateral run off, possible intervention, by Dr. Donzetta Matters.    I discussed in depth with the patient the nature of atherosclerosis, and emphasized the importance of maximal medical management including strict control of blood pressure, blood glucose, and lipid levels, obtaining regular exercise, and continued cessation of smoking.  The patient is aware that without maximal medical management the underlying atherosclerotic disease process will progress, limiting the benefit of any interventions.  The patient was given information about PAD including signs, symptoms, treatment, what symptoms should prompt the patient to seek immediate medical care, and risk reduction measures to take.  Clemon Chambers, RN, MSN, FNP-C [facility] Phone: [phone]  Clinic MD: Idalia Needle  11/18/17 3:28 PM

## 2017-11-28 NOTE — Op Note (Signed)
Patient name: Brianna Powers MRN: [id number] DOB: June 15, 1965 Sex: female  11/28/2017 Pre-operative Diagnosis: Bilateral buttock claudication with concern for in-stent restenosis of bilateral kissing iliac stents Post-operative diagnosis:  Same Surgeon:  Cephus Shelling, MD Procedure Performed: 1.  Ultrasound-guided access of the right common femoral artery 2.  Ultrasound-guided access of the left common femoral artery 3.  Aortogram with bilateral lower extremity runoff 4.  Bilateral kissing common iliac stents (7 mm x 59 mm VBX in the right common iliac and 7 mm x 79 mm VBX in the left common iliac) 5.  Mynx closure of the bilateral common femoral arteries 6.  94 minutes of monitored moderate sedation time  Indications: Patient is a 52 year old female that previously underwent bilateral kissing iliac stents and subsequently had these relined with VBX stents in 2018.  She was recently seen in clinic with symptoms of bilateral buttock claudication.  Iliac duplex revealed concern for greater than 50% in-stent restenosis of her bilateral common iliac stents.  She presents today for aortogram, bilateral lower extremity arteriogram, and to evaluate her iliac stents.  Findings: Patient had recurrent greater than 80% hemo-dynamically significant in-stent restenosis of her bilateral common iliac stents proximally.  The left iliac stent appeared to have a second stenotic segment distally within the stent.  There was also >50% stenosis of her native left common iliac artery just beyond the existing stent.  Her runoff of the bilateral lower extremity showed no other obvious embolic source or reason for current claudication symptoms in her buttock.   Procedure:  The patient was identified in the holding area and taken to room 8.  The patient was then placed supine on the table and prepped and draped in the usual sterile fashion.  A time out was called.  Ultrasound was used to evaluate the right  common femoral artery.  It was patent but small .  A digital ultrasound image was acquired.  A micropuncture needle was used to access the right common femoral artery under ultrasound guidance.  An 018 wire was advanced without resistance and a micropuncture sheath was placed.  The 018 wire was removed and a benson wire was placed.  The micropuncture sheath was exchanged for a 5 french sheath.    Ultrasound was used to evaluate the left common femoral artery.  It was patent but small .  A digital ultrasound image was acquired.  A micropuncture needle was used to access the left common femoral artery under ultrasound guidance.  An 018 wire was advanced without resistance and a micropuncture sheath was placed.  The 018 wire was removed and a benson wire was placed.  The micropuncture sheath was exchanged for a 5 french sheath.   We were able to cross her right common iliac stent retrograde using a Bentson wire in the right groin and then threaded a Omni Flush catheter into the infrarenal aorta.  An aortogram with bilateral lower extremity runoff was obtained.  Pertinent findings were greater than 80% recurrent in-stent restenosis of her bilateral common iliac stents.  Moreover she had evidence of additional common iliac disease on the left that was >50% and was more distal in the common iliac artery beyond the extent of her current stent.  We evaluate her runoff and saw no other obvious source for buttock claudication or symptoms of blue toes. We attempted upsize the sheath in her right groin and had to exchange for a long Amplatz wire over a KMP catheter and we advanced  a long 7 French sheath in the right groin to the aortic bifurcation.  We subsequently upsized to a long 7 French sheath in the left groin as well over a Bentson wire.  It should be noted there was significant scar in the right groin and access as well as sheath exchanges were more difficult on the right than the left.  Once our sheaths were in  place the patient was given 8000 units of IV heparin.  ACTs were checked throughout the case to maintain in between 250 -  300.  A final sheath injection in the left groin via retrograde hand-injection was performed to mark the hypogastric artery on the left since we were using a longer VBX stent on the left side and were careful not to cover this hypogastric artery.  At that point in time we then placed our 7 mm x 59 mm VBX on the right and 7 mm x 79 mm VBX in the left after our sheaths had been advanced to aortic bifurcation.  We then pulled the sheaths back once the stents were in place at the iliac bifurcation.  We placed the new stents approximately 1 mm higher than her previous existing stents and relined her old stents.  The stents were then deployed after inflating them to nominal pressure.  We then placed a Omni Flush catheter back into the aorta and shot a final aortogram that showed widely patent bilateral common iliac kissing stents after deployment.  There was brisk flow down both iliac arteries bilaterally with no remnant stenosis.  We then exchanged for short 7 French sheaths in both groins.  We then deployed Mynx closure devices bilaterally.  Patient was taken to PACU in stable condition.  Cephus Shelling, MD [facility] Office: [phone] Pager:    Cephus Shelling

## 2017-11-28 NOTE — Discharge Instructions (Signed)

## 2017-11-29 ENCOUNTER — Encounter (HOSPITAL_COMMUNITY): Payer: Self-pay | Admitting: Vascular Surgery

## 2017-11-29 ENCOUNTER — Encounter: Payer: Self-pay | Admitting: Vascular Surgery

## 2017-12-03 ENCOUNTER — Telehealth: Payer: Self-pay

## 2017-12-03 NOTE — Telephone Encounter (Signed)
Out of work letter faxed to employer.

## 2017-12-03 NOTE — Telephone Encounter (Signed)
-----   Message from Cephus Shelling, MD sent at 12/02/2017 11:34 AM EDT ----- Regarding: RE: Out of work Yes that would be fine.  Thanks,  Thayer Ohm   ----- Message ----- From: Bayard Hugger, LPN Sent: 08/29/598  11:26 AM EDT To: Cephus Shelling, MD Subject: Out of work                                    Good Morning Dr Chestine Spore,  This pt called today stating that she was cleared to return to work tomorrow.  She states she is still having pain and would like to stay out until Monday, December 09, 2017.   Is this acceptable?  Revonda Standard

## 2017-12-09 ENCOUNTER — Telehealth: Payer: Self-pay | Admitting: *Deleted

## 2017-12-09 NOTE — Telephone Encounter (Signed)
Patient call c/o "cramping right side abdominal pain since procedure" little relief with Tylenol. Denies any swelling of abdomen or groin. Pain is worse when she stands up. Denies and N/V, no issues with voiding/ or BM. Grions soft and dry. Will try ibuprofen and appointment with Rosalita ChessmanSuzanne for 9/17. [facility] any worsening condition.

## 2017-12-10 ENCOUNTER — Other Ambulatory Visit: Payer: Self-pay

## 2017-12-10 ENCOUNTER — Encounter: Payer: Self-pay | Admitting: Vascular Surgery

## 2017-12-10 ENCOUNTER — Encounter: Payer: Self-pay | Admitting: Family

## 2017-12-10 ENCOUNTER — Ambulatory Visit (INDEPENDENT_AMBULATORY_CARE_PROVIDER_SITE_OTHER): Payer: BLUE CROSS/BLUE SHIELD | Admitting: Family

## 2017-12-10 ENCOUNTER — Ambulatory Visit (HOSPITAL_COMMUNITY)
Admission: RE | Admit: 2017-12-10 | Discharge: 2017-12-10 | Disposition: A | Discharge status: Home / Self Care | Payer: BLUE CROSS/BLUE SHIELD | Source: Ambulatory Visit | Attending: Family | Admitting: Family

## 2017-12-10 VITALS — BP 132/93 | HR 77 | Temp 98.4°F | Resp 18 | Ht 60.0 in | Wt 169.0 lb

## 2017-12-10 DIAGNOSIS — I779 Disorder of arteries and arterioles, unspecified: Secondary | ICD-10-CM

## 2017-12-10 DIAGNOSIS — R1030 Lower abdominal pain, unspecified: Secondary | ICD-10-CM | POA: Diagnosis not present

## 2017-12-10 DIAGNOSIS — Z95828 Presence of other vascular implants and grafts: Secondary | ICD-10-CM | POA: Diagnosis not present

## 2017-12-10 DIAGNOSIS — R1032 Left lower quadrant pain: Secondary | ICD-10-CM

## 2017-12-10 DIAGNOSIS — Z87891 Personal history of nicotine dependence: Secondary | ICD-10-CM

## 2017-12-10 MED ORDER — TRAMADOL HCL 50 MG PO TABS: 50.0000 mg | ORAL_TABLET | Freq: Four times a day (QID) | ORAL | 0 refills | Status: DC | PRN

## 2017-12-10 MED ORDER — GABAPENTIN 100 MG PO CAPS: 100.0000 mg | ORAL_CAPSULE | Freq: Three times a day (TID) | ORAL | 0 refills | Status: DC

## 2017-12-10 NOTE — Patient Instructions (Signed)

## 2017-12-10 NOTE — Progress Notes (Signed)
CC: left groin and left proximal thigh pain x 4 days s/p angiogram on 11-28-17   History of Present Illness  Brianna Powers is a 52 y.o. female who is s/p placement of bilateral kissing common iliac stents (7 mm x 59 mm VBX in the right common iliac and 7 mm x 79 mm VBX in the left common iliac) and Mynx closure of the bilateral common femoral arteries on 11-28-17 by Dr. Carlis Abbott for bilateral buttock claudication with concern for in-stent restenosis of bilateral kissing iliac stents.   She had previously underwent bilateral kissing iliac stents and subsequently had these relined with VBX stents in 2018.   She returns today with c/o constant throbbing pain in left groin and anterior left thigh since 12-06-17 that improves slightly with acetaminophen. She has a mass in her left groin that she feels is not getting any bigger.   She had to leave work yesterday due to pain, states she may need a work note to stay out the remainder of the week. 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 She returned to work yesterday. The pain keeps her awake at night. At night she feels very cold, this started 11-28-17, she states feeling hot intermittently since 11-28-17.  She noticed "whelps" on both upper arms since yesterday, she denies pruritus.   She denies oral swelling, denies dyspnea.   She is scheduled to return on 12-26-17 with ABI's and bilateral aortoiliac duplex, see me on 12-27-17.   The patient is able to complete their activities of daily living.    Diabetic: Yes, last A1C result on file was 6.2 in November 2016. Serum creatinine was 0.40 on 11-28-17 Tobacco use: quit inNovember2017  Pt meds include: Statin : yes, Crestor 5 mg (not on med list) Betablocker: No ASA: 81 mg daily since 11-28-17, pt states advised by Dr. Carlis Abbott Other anticoagulants/antiplatelets: Plavix    For VQI Use Only  PRE-ADM LIVING: Home  AMB STATUS: Ambulatory   Past Medical History:  Diagnosis Date  .  Diabetes mellitus without complication ([place])   . Hypothyroidism   . Peripheral vascular disease [facility])     Past Surgical History:  Procedure Laterality Date  . ABDOMINAL AORTOGRAM N/A 05/02/2016   Procedure: Abdominal Aortogram;  Surgeon: Waynetta Sandy, MD;  Location: [place] [facility];  Service: Cardiovascular;  Laterality: N/A;  . ABDOMINAL AORTOGRAM W/LOWER EXTREMITY N/A 11/28/2017   Procedure: ABDOMINAL AORTOGRAM W/LOWER EXTREMITY;  Surgeon: Marty Heck, MD;  Location: [place] [facility];  Service: Cardiovascular;  Laterality: N/A;  Bilatertal  . FEMORAL ARTERY EXPLORATION Bilateral 01/28/2015   Procedure: BILATERAL FEMORAL ARTERY EXPLORATION;  Surgeon: Rosetta Posner, MD;  Location: [place];  Service: Vascular;  Laterality: Bilateral;  . INSERTION OF ILIAC STENT Bilateral 01/28/2015   Procedure: INSERTION OF RIGHT AND LEFT ILIAC STENT;  Surgeon: Rosetta Posner, MD;  Location: [place];  Service: Vascular;  Laterality: Bilateral;  . LOWER EXTREMITY ANGIOGRAPHY N/A 05/02/2016   Procedure: Bilateral Illac Stent;  Surgeon: Waynetta Sandy, MD;  Location: [place] [facility];  Service: Cardiovascular;  Laterality: N/A;  . PERIPHERAL VASCULAR CATHETERIZATION N/A 01/26/2015   Procedure: Abdominal Aortogram;  Surgeon: Rosetta Posner, MD;  Location: [place] [facility];  Service: Cardiovascular;  Laterality: N/A;  . PERIPHERAL VASCULAR CATHETERIZATION N/A 04/19/2016   Procedure: Abdominal Aortogram w/Lower Extremity;  Surgeon: Conrad Ironton, MD;  Location: [place] [facility];  Service: Cardiovascular;  Laterality:  N/A;  . PERIPHERAL VASCULAR INTERVENTION Bilateral 11/28/2017   Procedure: PERIPHERAL VASCULAR INTERVENTION;  Surgeon: Marty Heck, MD;  Location: [place] [facility];  Service: Cardiovascular;  Laterality: Bilateral;  Iliac    Family History  Problem Relation Age of Onset  . CAD Father   . Diabetes type II Father     Social History   Socioeconomic History  .  Marital status: Married    Spouse name: Not on file  . Number of children: Not on file  . Years of education: Not on file  . Highest education level: Not on file  Occupational History  . Not on file  Social Needs  . Financial resource strain: Not on file  . Food insecurity:    Worry: Not on file    Inability: Not on file  . Transportation needs:    Medical: Not on file    Non-medical: Not on file  Tobacco Use  . Smoking status: Former Smoker    Last attempt to quit: 05/25/2014    Years since quitting: 3.5  . Smokeless tobacco: Never Used  Substance and Sexual Activity  . Alcohol use: No    Alcohol/week: 0.0 standard drinks  . Drug use: No  . Sexual activity: Not on file  Lifestyle  . Physical activity:    Days per week: Not on file    Minutes per session: Not on file  . Stress: Not on file  Relationships  . Social connections:    Talks on phone: Not on file    Gets together: Not on file    Attends religious service: Not on file    Active member of club or organization: Not on file    Attends meetings of clubs or organizations: Not on file    Relationship status: Not on file  . Intimate partner violence:    Fear of current or ex partner: Not on file    Emotionally abused: Not on file    Physically abused: Not on file    Forced sexual activity: Not on file  Other Topics Concern  . Not on file  Social History Narrative  . Not on file    Allergies  Allergen Reactions  . Amoxicillin Rash    Panic attacks.     Current Outpatient Medications on File Prior to Visit  Medication Sig Dispense Refill  . ACCU-CHEK AVIVA PLUS test strip TEST twice a day 50 each PRN  . ACCU-CHEK SOFTCLIX LANCETS lancets TEST twice a day 100 each PRN  . clopidogrel (PLAVIX) 75 MG tablet Take 1 tablet (75 mg total) by mouth daily. 30 tablet 3  . glucose monitoring kit (FREESTYLE) monitoring kit 1 each by Does not apply route as needed for other. Use as directed 1 each 0  . levothyroxine  (SYNTHROID, LEVOTHROID) 75 MCG tablet Take 75 mcg by mouth daily before breakfast.    . Multiple Vitamin (MULTIVITAMIN) tablet Take 1 tablet by mouth daily.    . rosuvastatin (CRESTOR) 5 MG tablet Take 5 mg by mouth daily.    Marland Kitchen gabapentin (NEURONTIN) 100 MG capsule Take 100 mg by mouth 2 (two) times daily as needed. Reported on 05/24/2015  0  . metFORMIN (GLUCOPHAGE-XR) 500 MG 24 hr tablet Take 500 mg by mouth daily.  2  . predniSONE (STERAPRED UNI-PAK 21 TAB) 10 MG (21) TBPK tablet 40 mg by mouth daily for 7 days     No current facility-administered medications on file prior to visit.  Physical Examination  Vitals:   12/10/17 0844  BP: (!) 132/93  Pulse: 77  Resp: 18  Temp: 98.4 F (36.9 C)  TempSrc: Oral  SpO2: 99%  Weight: 169 lb (76.7 kg)  Height: 5' (1.524 m)   Body mass index is 33.01 kg/m.   General: A&O x 3, WDWN, obesefemale. Gait: slow, antalgic. HENT: No gross abnormalities Eyes: PERRLA. No scleral edema.  Pulmonary: Respirations are non labored, CTAB, good air movementin all fields Cardiac: regular rhythm, no detected murmur.    Carotid Bruits Right Left   Negative Negative   Abdominal aortic pulseis notpalpable. Radial pulses:2+ palpable and equal  VASCULAR EXAM: Extremitieswithischemic changes:cyanosis remains in all right toe tips, left toes are now pink (were cyanotic). WithoutGangrene; withoutopen wounds. Left groin with moderate sized palpable mass, non pulsatile.  LE Pulses Right Left  FEMORAL 2+palpable notpalpable, too tender to palpate  POPLITEAL notpalpable notpalpable  POSTERIOR TIBIAL 1+ palpable 2+palpable  DORSALIS PEDIS ANTERIOR TIBIAL Notpalpable Notpalpable   Abdomen: soft, NT, see Extremities Skin: no ulcers, see Extremities.2 small non pruritic hives on left upper arm, one on right upper arm.  Musculoskeletal:no muscle wasting or  atrophy. Neurologic: A&O X 3;appropriateaffect; SENSATION: normal; MOTOR FUNCTION: moving all extremities equally, motor strength 4/5 throughout. Speech is fluent/normal. CN 2-12 intact Psychiatric: Thought content is normal, mood appropriate for clinical situation.    Medical Decision Making  Brianna Powers is a 52 y.o. female who is s/p placement of bilateral kissing common iliac stents (7 mm x 59 mm VBX in the right common iliac and 7 mm x 79 mm VBX in the left common iliac) and Mynx closure of the bilateral common femoral arteries on 11-28-17 by Dr. Carlis Abbott for bilateral buttock claudication with concern for in-stent restenosis of bilateral kissing iliac stents.   She had previously underwent bilateral kissing iliac stents and subsequently had these relined with VBX stents in 2018.    Dr. Carlis Abbott spoke with and examined pt. Will obtain left groin duplex to evaluate for pseudoaneurysm vs hematoma. This can be done at 11 am today: no abnormality.  Result of left groin duplex: no pseudoaneurysm, no hematoma, no cyst, no abnormality seen. Pt requested work note to be excused form work for the remainder of this week due to the pain in the anterior and proximal aspect of her left thigh and left groin. She requested gabapentin (100 mg po bid prn, disp #20, 0 refills) to use during the day as she states she has used it before and it did not cause her to be drowsy, and tramadol (50 mg po q6h prn pain, disp #20, 0 refills) at night to ease pain so she could sleep; she declined narcotic prescription.  Return in 2 weeks as already scheduled to re-evaluate pain, and with ABI's and bilateral aortoiliac duplex on 12-26-17, see me on 12-27-17.   I discussed in depth with the patient the nature of atherosclerosis, and emphasized the importance of maximal medical management including strict control of blood pressure, blood glucose, and lipid levels, obtaining regular exercise, and cessation of smoking.   The patient is aware that without maximal medical management the underlying atherosclerotic disease process will progress, limiting the benefit of any interventions. She takes Plavix and rosuvastatin.   Thank you for allowing [place] to participate in this patient's care.  Brianna Chambers, RN, MSN, FNP-C [facility] Office: [phone]  12/10/2017, 9:07 AM  Clinic MD: Carlis Abbott

## 2017-12-19 ENCOUNTER — Other Ambulatory Visit: Payer: Self-pay

## 2017-12-19 DIAGNOSIS — I771 Stricture of artery: Secondary | ICD-10-CM

## 2017-12-19 DIAGNOSIS — I779 Disorder of arteries and arterioles, unspecified: Secondary | ICD-10-CM

## 2017-12-20 ENCOUNTER — Encounter (HOSPITAL_COMMUNITY): Payer: Self-pay | Admitting: Emergency Medicine

## 2017-12-20 ENCOUNTER — Emergency Department (HOSPITAL_COMMUNITY): Payer: BLUE CROSS/BLUE SHIELD

## 2017-12-20 ENCOUNTER — Ambulatory Visit: Payer: BLUE CROSS/BLUE SHIELD | Admitting: Family

## 2017-12-20 ENCOUNTER — Encounter (HOSPITAL_COMMUNITY): Payer: BLUE CROSS/BLUE SHIELD

## 2017-12-20 ENCOUNTER — Emergency Department (HOSPITAL_COMMUNITY)
Admission: EM | Admit: 2017-12-20 | Discharge: 2017-12-20 | Disposition: A | Discharge status: Home / Self Care | Payer: BLUE CROSS/BLUE SHIELD | Attending: Emergency Medicine | Admitting: Emergency Medicine

## 2017-12-20 ENCOUNTER — Other Ambulatory Visit: Payer: Self-pay

## 2017-12-20 DIAGNOSIS — Z79899 Other long term (current) drug therapy: Secondary | ICD-10-CM | POA: Diagnosis not present

## 2017-12-20 DIAGNOSIS — R1032 Left lower quadrant pain: Secondary | ICD-10-CM | POA: Insufficient documentation

## 2017-12-20 DIAGNOSIS — M7989 Other specified soft tissue disorders: Secondary | ICD-10-CM

## 2017-12-20 DIAGNOSIS — Z7902 Long term (current) use of antithrombotics/antiplatelets: Secondary | ICD-10-CM | POA: Insufficient documentation

## 2017-12-20 DIAGNOSIS — E119 Type 2 diabetes mellitus without complications: Secondary | ICD-10-CM | POA: Insufficient documentation

## 2017-12-20 DIAGNOSIS — E039 Hypothyroidism, unspecified: Secondary | ICD-10-CM | POA: Diagnosis not present

## 2017-12-20 DIAGNOSIS — R2242 Localized swelling, mass and lump, left lower limb: Secondary | ICD-10-CM | POA: Diagnosis not present

## 2017-12-20 DIAGNOSIS — Z87891 Personal history of nicotine dependence: Secondary | ICD-10-CM | POA: Diagnosis not present

## 2017-12-20 NOTE — ED Notes (Signed)
Awaiting reassessment from MD.

## 2017-12-20 NOTE — ED Provider Notes (Signed)
[facility] EMERGENCY DEPARTMENT Provider Note   CSN: [id number] Arrival date & time: 12/20/17  1111     History   Chief Complaint Chief Complaint  Patient presents with  . Groin Pain    HPI Brianna Powers is a 52 y.o. female.  Patient presenting with persistent left groin pain.  Patient is followed closely by vascular surgery.  Seen in outpatient visit on September 17.  At that time she had the same complaints that she is presenting for today.  They said then she has had left groin pain and left proximal thigh pain for 4 days status post angiogram on September 5.  Patient is taking Tylenol and tramadol for the pain she does not want anything stronger.  According to vascular notes.  Patient had status post placement of bilateral kissing common iliac stents and a minx closure of the bilateral common femoral arteries on September 5 by Dr. Carlis Abbott for bilateral buttock claudication with concern for in-stent restenosis of the bilateral kissing iliac stents.  Patient previously underwent bilateral kissing iliac stents and subsequently had these relined with VBX stents in 2018.  Patient on that day return for constant throbbing pain in left groin and anterior left thigh patient stated to them since December 06, 2017.  They felt that she had a mass in her left groin area that was not getting any bigger.  They did do Doppler studies of that during that visit and ruled out any pseudoaneurysm or any significant mass-effect.  They were planning to follow her back up in clinic for the first part of October and we are going to do ABIs again.  They did note in their exam that patient has persistent cyanosis to the right toes has been present for a long period of time.  Patient confirms that.  And the patient had nice pink toes on the left foot.  There was no significant leg swelling mentioned.     Past Medical History:  Diagnosis Date  . Diabetes mellitus without complication ([place])   . Hypothyroidism    . Peripheral vascular disease [facility])     Patient Active Problem List   Diagnosis Date Noted  . Aortic thrombus ([place]) 01/26/2015    Past Surgical History:  Procedure Laterality Date  . ABDOMINAL AORTOGRAM N/A 05/02/2016   Procedure: Abdominal Aortogram;  Surgeon: Waynetta Sandy, MD;  Location: [place] [facility];  Service: Cardiovascular;  Laterality: N/A;  . ABDOMINAL AORTOGRAM W/LOWER EXTREMITY N/A 11/28/2017   Procedure: ABDOMINAL AORTOGRAM W/LOWER EXTREMITY;  Surgeon: Marty Heck, MD;  Location: [place] [facility];  Service: Cardiovascular;  Laterality: N/A;  Bilatertal  . FEMORAL ARTERY EXPLORATION Bilateral 01/28/2015   Procedure: BILATERAL FEMORAL ARTERY EXPLORATION;  Surgeon: Rosetta Posner, MD;  Location: [place];  Service: Vascular;  Laterality: Bilateral;  . INSERTION OF ILIAC STENT Bilateral 01/28/2015   Procedure: INSERTION OF RIGHT AND LEFT ILIAC STENT;  Surgeon: Rosetta Posner, MD;  Location: [place];  Service: Vascular;  Laterality: Bilateral;  . LOWER EXTREMITY ANGIOGRAPHY N/A 05/02/2016   Procedure: Bilateral Illac Stent;  Surgeon: Waynetta Sandy, MD;  Location: [place] CV LAB;  Service: Cardiovascular;  Laterality: N/A;  . PERIPHERAL VASCULAR CATHETERIZATION N/A 01/26/2015   Procedure: Abdominal Aortogram;  Surgeon: Rosetta Posner, MD;  Location: [place] [facility];  Service: Cardiovascular;  Laterality: N/A;  . PERIPHERAL VASCULAR CATHETERIZATION N/A 04/19/2016   Procedure: Abdominal Aortogram w/Lower Extremity;  Surgeon: Conrad [place], MD;  Location:  [place] INVASIVE CV LAB;  Service: Cardiovascular;  Laterality: N/A;  . PERIPHERAL VASCULAR INTERVENTION Bilateral 11/28/2017   Procedure: PERIPHERAL VASCULAR INTERVENTION;  Surgeon: Marty Heck, MD;  Location: [place] [facility];  Service: Cardiovascular;  Laterality: Bilateral;  Iliac     OB History   None      Home Medications    Prior to Admission medications   Medication Sig Start Date End Date  Taking? Authorizing Provider  acetaminophen (TYLENOL) 500 MG tablet Take 1,000 mg by mouth 2 (two) times daily as needed for moderate pain.   Yes [provider]  clopidogrel (PLAVIX) 75 MG tablet Take 1 tablet (75 mg total) by mouth daily. 01/30/15  Yes Ulyses Amor, PA-C  diphenhydrAMINE-zinc acetate (BENADRYL) cream Apply 1 application topically 2 (two) times daily as needed for itching.   Yes [provider]  gabapentin (NEURONTIN) 100 MG capsule Take 1 capsule (100 mg total) by mouth 3 (three) times daily. 12/10/17  Yes Nickel, Sharmon Leyden, NP  levothyroxine (SYNTHROID, LEVOTHROID) 75 MCG tablet Take 75 mcg by mouth daily before breakfast.   Yes [provider]  rosuvastatin (CRESTOR) 5 MG tablet Take 5 mg by mouth daily.   Yes [provider]  traMADol (ULTRAM) 50 MG tablet Take 1 tablet (50 mg total) by mouth every 6 (six) hours as needed. Patient taking differently: Take 50 mg by mouth every 6 (six) hours as needed for moderate pain.  12/10/17  Yes Nickel, Sharmon Leyden, NP  ACCU-CHEK AVIVA PLUS test strip TEST twice a day 11/08/12   Florian Buff, MD  ACCU-CHEK SOFTCLIX LANCETS lancets TEST twice a day 11/08/12   Florian Buff, MD  glucose monitoring kit (FREESTYLE) monitoring kit 1 each by Does not apply route as needed for other. Use as directed 10/08/12   Florian Buff, MD  predniSONE (STERAPRED UNI-PAK 21 TAB) 10 MG (21) TBPK tablet 40 mg by mouth daily for 7 days 09/05/16   [provider]    Family History Family History  Problem Relation Age of Onset  . CAD Father   . Diabetes type II Father     Social History Social History   Tobacco Use  . Smoking status: Former Smoker    Last attempt to quit: 05/25/2014    Years since quitting: 3.5  . Smokeless tobacco: Never Used  Substance Use Topics  . Alcohol use: No    Alcohol/week: 0.0 standard drinks  . Drug use: No     Allergies   Amoxicillin   Review of Systems Review of Systems   Constitutional: Negative for fever.  HENT: Negative for congestion.   Respiratory: Negative for shortness of breath.   Cardiovascular: Positive for leg swelling. Negative for chest pain.  Gastrointestinal: Negative for abdominal pain.  Genitourinary: Negative for dysuria.  Musculoskeletal: Negative for back pain.  Skin: Negative for wound.  Neurological: Negative for headaches.  Hematological: Does not bruise/bleed easily.  Psychiatric/Behavioral: Negative for confusion.     Physical Exam Updated Vital Signs BP 125/85   Pulse 83   Temp 97.8 F (36.6 C) (Oral)   Resp 18   Ht 1.524 m (5')   Wt 76.7 kg   SpO2 97%   BMI 33.01 kg/m   Physical Exam  Constitutional: She is oriented to person, place, and time. She appears well-developed and well-nourished. No distress.  HENT:  Head: Normocephalic and atraumatic.  Mouth/Throat: Oropharynx is clear and moist.  Eyes: Pupils are equal, round,  and reactive to light. EOM are normal.  Neck: Normal range of motion. Neck supple.  Cardiovascular: Normal rate, regular rhythm and normal heart sounds.  Pulmonary/Chest: Effort normal and breath sounds normal.  Abdominal: Soft. Bowel sounds are normal. There is no tenderness.  Musculoskeletal: Normal range of motion. She exhibits edema.  Right lower extremity with some cyanosis to the toes.  No swelling.  This is unchanged in comparison to notes from vascular.  Left lower extremity with swelling from the thigh down.  No erythema.  Toes are pink good cap refill less than 2 seconds.  Left groin area without any swelling or pulsatile mass.  Neurological: She is alert and oriented to person, place, and time. No cranial nerve deficit or sensory deficit. She exhibits normal muscle tone. Coordination normal.  Skin: Skin is warm.  Nursing note and vitals reviewed.    ED Treatments / Results  Labs (all labs ordered are listed, but only abnormal results are displayed) Labs Reviewed - No data to  display  EKG None  Radiology US Venous Img Lower  Left (dvt Study)  Result Date: 12/20/2017 CLINICAL DATA:  52 year old female with a history of swelling EXAM: LEFT LOWER EXTREMITY VENOUS DOPPLER ULTRASOUND TECHNIQUE: Gray-scale sonography with graded compression, as well as color Doppler and duplex ultrasound were performed to evaluate the lower extremity deep venous systems from the level of the common femoral vein and including the common femoral, femoral, profunda femoral, popliteal and calf veins including the posterior tibial, peroneal and gastrocnemius veins when visible. The superficial great saphenous vein was also interrogated. Spectral Doppler was utilized to evaluate flow at rest and with distal augmentation maneuvers in the common femoral, femoral and popliteal veins. COMPARISON:  None. FINDINGS: Contralateral Common Femoral Vein: Respiratory phasicity is normal and symmetric with the symptomatic side. No evidence of thrombus. Normal compressibility. Common Femoral Vein: No evidence of thrombus. Normal compressibility, respiratory phasicity and response to augmentation. Saphenofemoral Junction: No evidence of thrombus. Normal compressibility and flow on color Doppler imaging. Profunda Femoral Vein: No evidence of thrombus. Normal compressibility and flow on color Doppler imaging. Femoral Vein: No evidence of thrombus. Normal compressibility, respiratory phasicity and response to augmentation. Popliteal Vein: No evidence of thrombus. Normal compressibility, respiratory phasicity and response to augmentation. Calf Veins: No evidence of thrombus. Normal compressibility and flow on color Doppler imaging. Superficial Great Saphenous Vein: No evidence of thrombus. Normal compressibility and flow on color Doppler imaging. Other Findings:  None. IMPRESSION: Sonographic survey of the left lower extremity negative for DVT Electronically Signed   By: Corrie Mckusick D.O.   On: 12/20/2017 14:02     Procedures Procedures (including critical care time)  Medications Ordered in ED Medications - No data to display   Initial Impression / Assessment and Plan / ED Course  I have reviewed the triage vital signs and the nursing notes.  Pertinent labs & imaging results that were available during my care of the patient were reviewed by me and considered in my medical decision making (see chart for details).     Patient's exam of her lower extremities other than the new swelling to the left lower extremity without significant abnormalities based on the recent notes by vascular surgery on 17 September.  Patient still has some mild cyanosis to the right toes.  And the left foot is got good pinking with good capillary refill less than 2 seconds.  Due to the swelling of the left leg patient had Doppler study to rule out  DVT and it was negative.  Patient also has had persistent left groin pain that was evaluated during her visit on the 17th by Doppler studies to rule out a pseudoaneurysm.  Patient has follow-up with vascular surgery this week.  They are trying to sort out why she is having persistent pain.  According to patient said that the pain persisted they were probably going to have to do a formal open procedure.  Patient has been taking Tylenol at home for the pain does not want anything stronger.  Does occasionally take tramadol at night so that she can rest better.  Has been struggling at work due to the pain to left groin.  Examination of the left groin today without any significant abnormalities on exam.  Patient will continue these pain medications and follow-up with vascular if things get worse recommended that patient go directly to [facility] for further evaluation for vascular surgery who could be available to see her on call over the weekend.  Final Clinical Impressions(s) / ED Diagnoses   Final diagnoses:  Left groin pain  Left leg swelling    ED Discharge Orders    None        Fredia Sorrow, MD 12/20/17 [phone]

## 2017-12-20 NOTE — ED Triage Notes (Signed)
LT groin pain since stent procedure at the beginning of September.  Pt reports swelling to lt foot

## 2017-12-20 NOTE — Discharge Instructions (Addendum)
Doppler studies of the left lower extremity without evidence of any deep vein thrombosis.  Recommend that he follow back up with vascular surgery for the chronic left groin pain.  This seems to be no significant change in your arterial vascular input to both lower extremities at this time.  If things get worse over the weekend would recommend going to [facility] where vascular surgery could lay eyes on you directly.  Continue your Tylenol continue your tramadol.  Work note provided.

## 2017-12-20 NOTE — ED Notes (Signed)
EDP at bedside  

## 2017-12-25 ENCOUNTER — Telehealth: Payer: Self-pay | Admitting: *Deleted

## 2017-12-25 ENCOUNTER — Telehealth (HOSPITAL_COMMUNITY): Payer: Self-pay | Admitting: Vascular Surgery

## 2017-12-25 NOTE — Telephone Encounter (Signed)
Call to patient to clarify "call she left at office last week" States she is not sure who the call went to, but has been in contact with Revonda Standard. She is upset that her doctor was switched at the last minute and saw Dr. Chestine Spore instead of Dr. Arbie Cookey. Attempted to explain to her that Dr. Arbie Cookey does not to procedures in the Vascular lab and procedure would not have been scheduled with Dr. Arbie Cookey in any case. She is very upset and worried. State she has had problems ever since this procedure.I told her I would look for an appointment time with Dr. Arbie Cookey that her spouse should attend with her. She is agreeable to that.  I informed her in any worsening or emergent condition to go to [place].

## 2017-12-25 NOTE — Telephone Encounter (Signed)
Reminded patient to keep 12/26/17 appt for vascular studies and 12/27/17 appt with NP.

## 2017-12-25 NOTE — Telephone Encounter (Signed)
When calling to confirm appointments for 12/26/17 at [facility], Mrs. Nodal requested a return call from Dr. Arbie Cookey.   She stated she called [facility] on Thursday requesting a return call, she was having an emergency and stated in the voice message she was going to head to [facility] ER.   She states she has yet to hear from the office. She is having left leg swelling and is concerned.   She and her husband would like to discuss with Dr. Arbie Cookey only.  The ER physician said something to her that has her concerned that the "stent didn't take". She does not want to talk with Dr. Randie Heinz or Dr. Chestine Spore.   I advised Dr. Arbie Cookey is in the office on Tuesdays but would see if the RN could get a message to him if he calls into the office.

## 2017-12-26 ENCOUNTER — Ambulatory Visit (INDEPENDENT_AMBULATORY_CARE_PROVIDER_SITE_OTHER)
Admission: RE | Admit: 2017-12-26 | Discharge: 2017-12-26 | Disposition: A | Discharge status: Home / Self Care | Payer: BLUE CROSS/BLUE SHIELD | Source: Ambulatory Visit | Attending: Family | Admitting: Family

## 2017-12-26 ENCOUNTER — Encounter: Payer: Self-pay | Admitting: Vascular Surgery

## 2017-12-26 ENCOUNTER — Ambulatory Visit (HOSPITAL_COMMUNITY)
Admission: RE | Admit: 2017-12-26 | Discharge: 2017-12-26 | Disposition: A | Discharge status: Home / Self Care | Payer: BLUE CROSS/BLUE SHIELD | Source: Ambulatory Visit | Attending: Family | Admitting: Family

## 2017-12-26 DIAGNOSIS — I771 Stricture of artery: Secondary | ICD-10-CM | POA: Diagnosis not present

## 2017-12-26 DIAGNOSIS — E1151 Type 2 diabetes mellitus with diabetic peripheral angiopathy without gangrene: Secondary | ICD-10-CM | POA: Insufficient documentation

## 2017-12-26 DIAGNOSIS — Z95828 Presence of other vascular implants and grafts: Secondary | ICD-10-CM | POA: Diagnosis not present

## 2017-12-26 DIAGNOSIS — Z6832 Body mass index (BMI) 32.0-32.9, adult: Secondary | ICD-10-CM | POA: Diagnosis not present

## 2017-12-26 DIAGNOSIS — Z87891 Personal history of nicotine dependence: Secondary | ICD-10-CM | POA: Diagnosis not present

## 2017-12-26 DIAGNOSIS — E669 Obesity, unspecified: Secondary | ICD-10-CM | POA: Diagnosis not present

## 2017-12-26 DIAGNOSIS — I779 Disorder of arteries and arterioles, unspecified: Secondary | ICD-10-CM

## 2017-12-27 ENCOUNTER — Ambulatory Visit (INDEPENDENT_AMBULATORY_CARE_PROVIDER_SITE_OTHER): Payer: Self-pay | Admitting: Family

## 2017-12-27 ENCOUNTER — Encounter: Payer: Self-pay | Admitting: Vascular Surgery

## 2017-12-27 ENCOUNTER — Other Ambulatory Visit: Payer: Self-pay

## 2017-12-27 ENCOUNTER — Encounter: Payer: Self-pay | Admitting: Family

## 2017-12-27 VITALS — BP 126/92 | HR 97 | Temp 99.3°F | Resp 14 | Ht 61.0 in | Wt 170.0 lb

## 2017-12-27 DIAGNOSIS — Z87891 Personal history of nicotine dependence: Secondary | ICD-10-CM

## 2017-12-27 DIAGNOSIS — I779 Disorder of arteries and arterioles, unspecified: Secondary | ICD-10-CM

## 2017-12-27 DIAGNOSIS — I771 Stricture of artery: Secondary | ICD-10-CM

## 2017-12-27 NOTE — Patient Instructions (Signed)

## 2017-12-27 NOTE — Progress Notes (Addendum)
Postoperative Visit   History of Present Illness  Brianna Powers is a 52 y.o. female who is s/p placement of bilateral kissing common iliac stents (7 mm x 59 mm VBX in the right common iliac and 7 mm x 79 mm VBX in the left common iliac) and Mynx closure of the bilateral common femoral arteries on 11-28-17 by Dr. Chestine Spore for bilateral buttock claudication with concern for in-stent restenosis of bilateral kissing iliac stents.   She had previouslyunderwentbilateral kissing iliac stents and subsequently had these relined with VBX stents in 2018.   She returned on 12-10-17 with c/o constant throbbing pain in left groin and anterior left thigh since 12-06-17 that improved slightly with acetaminophen. She had a mass in her left groin that she felt was not getting any bigger; this was found on left groin duplex (12-26-17) not to be a pseudoaneurysm, no hematoma, no cyst, no abnormality seen.  At night she feels very cold, this started 11-28-17, she states feeling hot intermittently since 11-28-17.  She noticed "whelps" on both upper arms since 12-09-17, she denies pruritus.   She denies oral swelling, denies dyspnea.   She returns today for ABI's and bilateral aortoiliac duplex and discuss results.   She is able to complete their activities of daily living.    She had a negative DVT study at [facility] on 12-20-17.  Her concern is left ankle swelling, not relieved by elevation, swelling started about the second week in September.    Diabetic: Yes, last A1C result on file was 6.2 in November 2016. Serum creatinine was 0.40 on 11-28-17 Tobacco use: quit inNovember2017  Pt meds include: Statin :yes, Crestor 5 mg (not on med list) Betablocker: No ASA: 81 mg daily since 11-28-17, pt states advised by Dr. Chestine Spore Other anticoagulants/antiplatelets: Plavix    For [facility]QI Use Only  PRE-ADM LIVING: Home  AMB STATUS: Ambulatory  Physical Examination  Vitals:   12/27/17 1511  BP: (!) 126/92  Pulse:  97  Resp: 14  Temp: 99.3 F (37.4 C)  TempSrc: Oral  SpO2: 96%  Weight: 170 lb (77.1 kg)  Height: 5\' 1"  (1.549 m)   Body mass index is 32.12 kg/m.  PHYSICAL EXAMINATION: General: A&O x 3, WDWN, obesefemale. Gait: normal HENT: No gross abnormalities Eyes: PERRLA. No scleral edema.  Pulmonary: Respirations are non labored, CTAB, good air movementin all fields Cardiac: regular rhythm, no detected murmur.    Carotid Bruits Right Left   Negative Negative   Abdominal aortic pulseis notpalpable. Radial pulses:2+ palpable and equal  VASCULAR EXAM: Extremitieswithischemic changes:cyanosis remains in all right toe tips, left toes are now pink (were cyanotic). WithoutGangrene; withoutopen wounds. Left groin with moderate sized palpable mass, non pulsatile.  LE Pulses Right Left  FEMORAL 2+palpable notpalpable, too tender to palpate  POPLITEAL notpalpable notpalpable  POSTERIOR TIBIAL 1+ palpable 2+palpable  DORSALIS PEDIS ANTERIOR TIBIAL Notpalpable Notpalpable   Abdomen: soft, NT, see Extremities Skin: no ulcers, see Extremities. Musculoskeletal:no muscle wasting or atrophy. Neurologic: A&O X 3;appropriateaffect; SENSATION: normal; MOTOR FUNCTION: moving all extremities equally, motor strength 4/5 throughout. Speech is fluent/normal. CN 2-12 intact Psychiatric: Thought content is normal, mood appropriate for clinical situation.   DATA  Abdominal Aorta Findings (12-26-17): +--------+-------+----------+----------+--------+--------+--------+ LocationAP (cm)Trans (cm)PSV (cm/s)WaveformThrombusComments +--------+-------+----------+----------+--------+--------+--------+ Proximal         85    biphasic         +--------+-------+----------+----------+--------+--------+--------+ Visualization of the Mid Abdominal Aorta and Distal Abdominal Aorta was  limited.  Right Stent(s): +-------------------+--------+--------+----------+--------+  Common iliac arteryPSV cm/sStenosisWaveform Comments +-------------------+--------+--------+----------+--------+ Prox to Stent   198       biphasic      +-------------------+--------+--------+----------+--------+ Proximal Stent   105       biphasic      +-------------------+--------+--------+----------+--------+ Mid Stent     72       monophasic     +-------------------+--------+--------+----------+--------+ Distal Stent    104       biphasic      +-------------------+--------+--------+----------+--------+ Distal to Stent  104       biphasic      +-------------------+--------+--------+----------+--------+ Stent walls not well visualized.   Left Stent(s): +-------------------+--------+--------+---------+--------+ Common iliac arteryPSV cm/sStenosisWaveform Comments +-------------------+--------+--------+---------+--------+ Prox to Stent   95       biphasic      +-------------------+--------+--------+---------+--------+ Proximal Stent   167       triphasic     +-------------------+--------+--------+---------+--------+ Mid Stent     55       biphasic      +-------------------+--------+--------+---------+--------+ Distal Stent    86       biphasic      +-------------------+--------+--------+---------+--------+ Distal to Stent  138       biphasic      +-------------------+--------+--------+---------+--------+    Final Interpretation: Stenosis: +------------------+-----------+ Location     Stent    +------------------+-----------+ Right Common Iliacno stenosis +------------------+-----------+ Left Common Iliac no stenosis +------------------+-----------+   ABI  Findings (10- +---------+------------------+-----+---------+--------+ Right  Rt Pressure (mmHg)IndexWaveform Comment  +---------+------------------+-----+---------+--------+ Brachial 107                     +---------+------------------+-----+---------+--------+ PTA   129        1.07 triphasic     +---------+------------------+-----+---------+--------+ DP    110        0.91 biphasic      +---------+------------------+-----+---------+--------+ Great Toe0         0.00 Absent       +---------+------------------+-----+---------+--------+  +---------+------------------+-----+---------+-------+ Left   Lt Pressure (mmHg)IndexWaveform Comment +---------+------------------+-----+---------+-------+ Brachial 121                     +---------+------------------+-----+---------+-------+ PTA   136        1.12 triphasic     +---------+------------------+-----+---------+-------+ DP    106        0.88 biphasic      +---------+------------------+-----+---------+-------+ Great Toe56        0.46           +---------+------------------+-----+---------+-------+  +-------+-----------+-----------+------------+------------+ ABI/TBIToday's ABIToday's TBIPrevious ABIPrevious TBI +-------+-----------+-----------+------------+------------+ Right 1.07    0.00    0.68    0       +-------+-----------+-----------+------------+------------+ Left  1.12    0.46    0.79    0       +-------+-----------+-----------+------------+------------+   Bilateral ABIs appear increased compared to prior study on 11/14/2017. Left TBIs appear increased compared to prior study on 11/14/2017.  Final Interpretation: Right: Resting right ankle-brachial index is within normal range. No evidence of significant  right lower extremity arterial disease. The right toe-brachial index is abnormal.  Left: Resting left ankle-brachial index is within normal range. No evidence of significant left lower extremity arterial disease. The left toe-brachial index is abnormal. LT Great toe pressure = 56 mmHg.    Medical Decision Making  Brianna Powers is a 52 y.o. female who is s/p placement of bilateral kissing common iliac stents (7 mm x 59 mm VBX in the right common iliac and 7 mm x 79 mm VBX in  the left common iliac) and Mynx closure of the bilateral common femoral arteries on 11-28-17 by Dr. Chestine Spore for bilateral buttock claudication with concern for in-stent restenosis of bilateral kissing iliac stents.   She had previouslyunderwentbilateral kissing iliac stents and subsequently had these relined with VBX stents in 2018.   She has an appointment to see Dr. Arbie Cookey on 01-21-18 to consult him. She denies pain in right great toe, but TBI in that toe is 0, no ulcers, no gangrene, toe is pale.  She no longer c/o left groin pain.   Result of left groin duplex (12-26-17): no pseudoaneurysm, no hematoma, no cyst, no abnormality seen. Pt requested work note to be excused form work for the remainder of this week due to the pain in the anterior and proximal aspect of her left thigh and left groin. On 12-10-17 she requested gabapentin (100 mg po bid prn, disp #20, 0 refills) to use during the day as she states she has used it before and it did not cause her to be drowsy, and tramadol (50 mg po q6h prn pain, disp #20, 0 refills) at night to ease pain so she could sleep; she declined narcotic prescription.   Her DM remains in good control, she quit smoking in 2017. She is on dual antiplatelet therapy and a statin.    Thank you for allowing [place] to participate in this patient's care.  Charisse March, RN, MSN, FNP-C [facility] Office: [phone]  12/28/2017, 3:07 PM  Clinic MD: Randie Heinz

## 2017-12-28 ENCOUNTER — Encounter: Payer: Self-pay | Admitting: Family

## 2017-12-31 DIAGNOSIS — Z6833 Body mass index (BMI) 33.0-33.9, adult: Secondary | ICD-10-CM | POA: Diagnosis not present

## 2017-12-31 DIAGNOSIS — R768 Other specified abnormal immunological findings in serum: Secondary | ICD-10-CM | POA: Diagnosis not present

## 2017-12-31 DIAGNOSIS — I739 Peripheral vascular disease, unspecified: Secondary | ICD-10-CM | POA: Diagnosis not present

## 2017-12-31 DIAGNOSIS — R2242 Localized swelling, mass and lump, left lower limb: Secondary | ICD-10-CM | POA: Diagnosis not present

## 2018-01-20 DIAGNOSIS — R768 Other specified abnormal immunological findings in serum: Secondary | ICD-10-CM | POA: Diagnosis not present

## 2018-01-20 DIAGNOSIS — M79605 Pain in left leg: Secondary | ICD-10-CM | POA: Diagnosis not present

## 2018-01-20 DIAGNOSIS — Z0000 Encounter for general adult medical examination without abnormal findings: Secondary | ICD-10-CM | POA: Diagnosis not present

## 2018-01-20 DIAGNOSIS — Z6833 Body mass index (BMI) 33.0-33.9, adult: Secondary | ICD-10-CM | POA: Diagnosis not present

## 2018-01-20 DIAGNOSIS — E782 Mixed hyperlipidemia: Secondary | ICD-10-CM | POA: Diagnosis not present

## 2018-01-20 DIAGNOSIS — R2242 Localized swelling, mass and lump, left lower limb: Secondary | ICD-10-CM | POA: Diagnosis not present

## 2018-01-21 ENCOUNTER — Ambulatory Visit: Payer: BLUE CROSS/BLUE SHIELD | Admitting: Vascular Surgery

## 2018-01-22 ENCOUNTER — Encounter: Payer: Self-pay | Admitting: Vascular Surgery

## 2018-01-24 DIAGNOSIS — M79605 Pain in left leg: Secondary | ICD-10-CM | POA: Diagnosis not present

## 2018-02-11 ENCOUNTER — Ambulatory Visit: Payer: BLUE CROSS/BLUE SHIELD | Admitting: Cardiology

## 2018-04-07 ENCOUNTER — Encounter: Payer: Self-pay | Admitting: Cardiovascular Disease

## 2018-04-07 ENCOUNTER — Ambulatory Visit (INDEPENDENT_AMBULATORY_CARE_PROVIDER_SITE_OTHER): Payer: BLUE CROSS/BLUE SHIELD | Admitting: Cardiovascular Disease

## 2018-04-07 VITALS — BP 128/74 | HR 89 | Ht 60.0 in | Wt 171.0 lb

## 2018-04-07 DIAGNOSIS — I739 Peripheral vascular disease, unspecified: Secondary | ICD-10-CM

## 2018-04-07 DIAGNOSIS — E785 Hyperlipidemia, unspecified: Secondary | ICD-10-CM | POA: Diagnosis not present

## 2018-04-07 NOTE — Patient Instructions (Signed)
Medication Instructions:  Your physician recommends that you continue on your current medications as directed. Please refer to the Current Medication list given to you today.  If you need a refill on your cardiac medications before your next appointment, please call your pharmacy.   Lab work: None today If you have labs (blood work) drawn today and your tests are completely normal, you will receive your results only by: Marland Kitchen MyChart Message (if you have MyChart) OR . A paper copy in the mail If you have any lab test that is abnormal or we need to change your treatment, we will call you to review the results.  Testing/Procedures: None today  Follow-Up: At [facility], you and your health needs are our priority.  As part of our continuing mission to provide you with exceptional heart care, we have created designated Provider Care Teams.  These Care Teams include your primary Cardiologist (physician) and Advanced Practice Providers (APPs -  Physician Assistants and Nurse Practitioners) who all work together to provide you with the care you need, when you need it. You will need a follow up appointment in 6 months.  Please call our office 2 months in advance to schedule this appointment.  You may see Dr. Prentice Docker or one of the following Advanced Practice Providers on your designated Care Team:   Randall An, PA-C [facility]) . Jacolyn Reedy, PA-C [facility])  Any Other Special Instructions Will Be Listed Below (If Applicable). None

## 2018-04-07 NOTE — Progress Notes (Signed)
CARDIOLOGY CONSULT NOTE  Patient ID: Brianna Powers MRN: [id number] DOB/AGE: 1965/04/29 53 y.o.  Admit date: (Not on file) Primary Physician: Celene Squibb, MD Referring Physician: Celene Squibb, MD  Reason for Consultation: Peripheral arterial disease  HPI: Brianna Powers is a 53 y.o. female who is being seen today for the evaluation of peripheral arterial disease at the request of Celene Squibb, MD.   Past medical history includes peripheral vascular disease.  She is s/p placement of bilateral kissing common iliac stents (7 mm x 59 mm VBX in the right common iliac and 7 mm x 79 mm VBX in the left common iliac)andMynx closure of the bilateral common femoral arterieson 11-28-17 by Dr. Carlis Abbott for bilateral buttock claudication with concern for in-stent restenosis of bilateral kissing iliac stents.   She hadpreviouslyunderwentbilateral kissing iliac stents and subsequently had these relined with VBX stents in 2018.   She underwent normal left lower extremity Dopplers on 12/20/2017.  She has a prior history of smoking and quit in November 2017.  Echocardiogram on 01/27/2015 showed normal left ventricular systolic function and regional wall motion, LVEF 55 to 60%, there was mild left atrial dilatation and a small pericardial effusion.  I personally reviewed the ECG performed on 11/28/2017 which demonstrated normal sinus rhythm with no ischemic ST segment or T wave abnormalities, nor any arrhythmias.  The patient denies any symptoms of chest pain, palpitations, shortness of breath, lightheadedness, dizziness, leg swelling, orthopnea, PND, and syncope.  She feels like she has been doing well overall.  She has some left knee pain which he attributes to arthritis and the need to lose weight.  Her husband is also my patient.  She denies claudication and discoloration of feet/toes.  She does feel fatigued but she has been the primary caregiver for her mother.  Her father passed  away 2 years ago.  Her mother-in-law passed away 2 months ago.  Her husband has been his father's primary caregiver.  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    Allergies  Allergen Reactions  . Amoxicillin Rash    Panic attacks.     Current Outpatient Medications  Medication Sig Dispense Refill  . ACCU-CHEK AVIVA PLUS test strip TEST twice a day 50 each PRN  . ACCU-CHEK SOFTCLIX LANCETS lancets TEST twice a day 100 each PRN  . acetaminophen (TYLENOL) 500 MG tablet Take 1,000 mg by mouth 2 (two) times daily as needed for moderate pain.    Marland Kitchen clopidogrel (PLAVIX) 75 MG tablet Take 1 tablet (75 mg total) by mouth daily. 30 tablet 3  . diphenhydrAMINE-zinc acetate (BENADRYL) cream Apply 1 application topically 2 (two) times daily as needed for itching.    . gabapentin (NEURONTIN) 100 MG capsule Take 1 capsule (100 mg total) by mouth 3 (three) times daily. 20 capsule 0  . glucose monitoring kit (FREESTYLE) monitoring kit 1 each by Does not apply route as needed for other. Use as directed 1 each 0  . levothyroxine (SYNTHROID, LEVOTHROID) 75 MCG tablet Take 75 mcg by mouth daily before breakfast.    . predniSONE (STERAPRED UNI-PAK 21 TAB) 10 MG (21) TBPK tablet 40 mg by mouth daily for 7 days    . rosuvastatin (CRESTOR) 10 MG tablet Take 10 mg by mouth daily.    . traMADol (ULTRAM) 50 MG tablet Take 1 tablet (50 mg total) by mouth every 6 (six) hours as needed. (Patient taking differently:  Take 50 mg by mouth every 6 (six) hours as needed for moderate pain. ) 20 tablet 0   No current facility-administered medications for this visit.     Past Medical History:  Diagnosis Date  . Diabetes mellitus without complication ([place])   . Hypothyroidism   . Peripheral vascular disease [facility])     Past Surgical History:  Procedure Laterality Date  . ABDOMINAL AORTOGRAM N/A 05/02/2016   Procedure: Abdominal Aortogram;  Surgeon: Waynetta Sandy, MD;  Location: [place] CV [facility];  Service: Cardiovascular;  Laterality: N/A;  . ABDOMINAL AORTOGRAM W/LOWER EXTREMITY N/A 11/28/2017   Procedure: ABDOMINAL AORTOGRAM W/LOWER EXTREMITY;  Surgeon: Marty Heck, MD;  Location: [place] [facility];  Service: Cardiovascular;  Laterality: N/A;  Bilatertal  . FEMORAL ARTERY EXPLORATION Bilateral 01/28/2015   Procedure: BILATERAL FEMORAL ARTERY EXPLORATION;  Surgeon: Rosetta Posner, MD;  Location: [place];  Service: Vascular;  Laterality: Bilateral;  . INSERTION OF ILIAC STENT Bilateral 01/28/2015   Procedure: INSERTION OF RIGHT AND LEFT ILIAC STENT;  Surgeon: Rosetta Posner, MD;  Location: [place];  Service: Vascular;  Laterality: Bilateral;  . LOWER EXTREMITY ANGIOGRAPHY N/A 05/02/2016   Procedure: Bilateral Illac Stent;  Surgeon: Waynetta Sandy, MD;  Location: [place] CV LAB;  Service: Cardiovascular;  Laterality: N/A;  . PERIPHERAL VASCULAR CATHETERIZATION N/A 01/26/2015   Procedure: Abdominal Aortogram;  Surgeon: Rosetta Posner, MD;  Location: [place] CV LAB;  Service: Cardiovascular;  Laterality: N/A;  . PERIPHERAL VASCULAR CATHETERIZATION N/A 04/19/2016   Procedure: Abdominal Aortogram w/Lower Extremity;  Surgeon: Conrad Kimmswick, MD;  Location: [place] CV LAB;  Service: Cardiovascular;  Laterality: N/A;  . PERIPHERAL VASCULAR INTERVENTION Bilateral 11/28/2017   Procedure: PERIPHERAL VASCULAR INTERVENTION;  Surgeon: Marty Heck, MD;  Location: [place] CV LAB;  Service: Cardiovascular;  Laterality: Bilateral;  Iliac    Social History   Socioeconomic History  . Marital status: Married    Spouse name: Not on file  . Number of children: Not on file  . Years of education: Not on file  . Highest education level: Not on file  Occupational History  . Not on file  Social Needs  . Financial resource strain: Not on file  . Food insecurity:    Worry: Not on file    Inability: Not on file  . Transportation needs:    Medical: Not on file     Non-medical: Not on file  Tobacco Use  . Smoking status: Former Smoker    Last attempt to quit: 05/25/2014    Years since quitting: 3.8  . Smokeless tobacco: Never Used  Substance and Sexual Activity  . Alcohol use: No    Alcohol/week: 0.0 standard drinks  . Drug use: No  . Sexual activity: Not on file  Lifestyle  . Physical activity:    Days per week: Not on file    Minutes per session: Not on file  . Stress: Not on file  Relationships  . Social connections:    Talks on phone: Not on file    Gets together: Not on file    Attends religious service: Not on file    Active member of club or organization: Not on file    Attends meetings of clubs or organizations: Not on file    Relationship status: Not on file  . Intimate partner violence:    Fear of current or ex partner: Not on file    Emotionally abused: Not on  file    Physically abused: Not on file    Forced sexual activity: Not on file  Other Topics Concern  . Not on file  Social History Narrative  . Not on file     No family history of premature CAD in 1st degree relatives.  Current Meds  Medication Sig  . ACCU-CHEK AVIVA PLUS test strip TEST twice a day  . ACCU-CHEK SOFTCLIX LANCETS lancets TEST twice a day  . acetaminophen (TYLENOL) 500 MG tablet Take 1,000 mg by mouth 2 (two) times daily as needed for moderate pain.  Marland Kitchen clopidogrel (PLAVIX) 75 MG tablet Take 1 tablet (75 mg total) by mouth daily.  . diphenhydrAMINE-zinc acetate (BENADRYL) cream Apply 1 application topically 2 (two) times daily as needed for itching.  . gabapentin (NEURONTIN) 100 MG capsule Take 1 capsule (100 mg total) by mouth 3 (three) times daily.  Marland Kitchen glucose monitoring kit (FREESTYLE) monitoring kit 1 each by Does not apply route as needed for other. Use as directed  . levothyroxine (SYNTHROID, LEVOTHROID) 75 MCG tablet Take 75 mcg by mouth daily before breakfast.  . predniSONE (STERAPRED UNI-PAK 21 TAB) 10 MG (21) TBPK tablet 40 mg by mouth daily  for 7 days  . rosuvastatin (CRESTOR) 10 MG tablet Take 10 mg by mouth daily.  . traMADol (ULTRAM) 50 MG tablet Take 1 tablet (50 mg total) by mouth every 6 (six) hours as needed. (Patient taking differently: Take 50 mg by mouth every 6 (six) hours as needed for moderate pain. )  . [DISCONTINUED] rosuvastatin (CRESTOR) 5 MG tablet Take 5 mg by mouth daily.      Review of systems complete and found to be negative unless listed above in HPI    Physical exam Blood pressure 128/74, pulse 89, height 5' (1.524 m), weight 171 lb (77.6 kg), SpO2 98 %. General: NAD Neck: No JVD, no thyromegaly or thyroid nodule.  Lungs: Clear to auscultation bilaterally with normal respiratory effort. CV: Nondisplaced PMI. Regular rate and rhythm, normal S1/S2, no S3/S4, no murmur.  No peripheral edema.  No carotid bruit.    Abdomen: Soft, nontender, no distention.  Skin: Intact without lesions or rashes.  Neurologic: Alert and oriented x 3.  Psych: Normal affect. Extremities: No clubbing or cyanosis.  HEENT: Normal.   ECG: Most recent ECG reviewed.   Labs: Lab Results  Component Value Date/Time   K 4.9 11/28/2017 12:55 PM   BUN 18 11/28/2017 12:55 PM   CREATININE 0.40 (L) 11/28/2017 12:55 PM   ALT 16 01/26/2015 07:31 PM   HGB 15.3 (H) 11/28/2017 12:55 PM     Lipids: No results found for: LDLCALC, LDLDIRECT, CHOL, TRIG, HDL      ASSESSMENT AND PLAN:  1.  Peripheral arterial disease: Currently on Plavix and statin.  Denies claudication.  Physical exam is normal.  Continue current therapy.  She has historically followed with vascular surgery in [place].  2.  Hyperlipidemia: Currently on rosuvastatin.  I will try to obtain a copy of lipids from PCP.   Disposition: Follow up in 6 months  Signed: Kate Sable, M.D., F.A.C.C.  04/07/2018, 9:23 AM

## 2018-05-21 ENCOUNTER — Telehealth: Payer: Self-pay | Admitting: Cardiovascular Disease

## 2018-05-21 NOTE — Telephone Encounter (Signed)
Pt called and reports that she has been having left leg and groin pain and weakness. Pt reports that she went walking this weekend and her started to feel like "jelly". Pt denies swelling at this time. She can be reached at work at [phone]. Please advise.

## 2018-05-21 NOTE — Telephone Encounter (Signed)
Called pt. No answer. Left msg to call back.  

## 2018-05-21 NOTE — Telephone Encounter (Signed)
I recommend she get evaluated by vascular surgery whom she has followed with in the past as she likely requires noninvasive studies to begin with.  I will copy them on this note.

## 2018-05-21 NOTE — Telephone Encounter (Signed)
Please give pt a call she's having pain in her lt leg and thinks she may be getting another blockage.

## 2018-05-21 NOTE — Telephone Encounter (Signed)
Pt notified and voiced understanding 

## 2018-05-22 ENCOUNTER — Encounter: Payer: Self-pay | Admitting: *Deleted

## 2018-05-22 ENCOUNTER — Telehealth: Payer: Self-pay | Admitting: *Deleted

## 2018-05-22 ENCOUNTER — Other Ambulatory Visit: Payer: Self-pay | Admitting: *Deleted

## 2018-05-22 ENCOUNTER — Other Ambulatory Visit: Payer: Self-pay

## 2018-05-22 DIAGNOSIS — I779 Disorder of arteries and arterioles, unspecified: Secondary | ICD-10-CM

## 2018-05-22 NOTE — Progress Notes (Signed)
Lower extremity LEFT arterial duplex ordered. Pending office appointment on 05/27/2018.

## 2018-05-22 NOTE — Telephone Encounter (Signed)
Patient called and stated "I think I am getting blocked again". Increase in pain, weakness to Left Leg and groin. Weakness to left leg but no ischemia. Will sched. ABI"S and office appointment with Dr. Chestine Spore for 05/27/2018. Instructed to go to [facility] ER for worsening or acute symptoms of leg ischemia.

## 2018-05-23 ENCOUNTER — Other Ambulatory Visit: Payer: Self-pay | Admitting: Vascular Surgery

## 2018-05-23 ENCOUNTER — Encounter: Payer: Self-pay | Admitting: Vascular Surgery

## 2018-05-23 ENCOUNTER — Ambulatory Visit (INDEPENDENT_AMBULATORY_CARE_PROVIDER_SITE_OTHER)
Admission: RE | Admit: 2018-05-23 | Discharge: 2018-05-23 | Disposition: A | Discharge status: Home / Self Care | Payer: BLUE CROSS/BLUE SHIELD | Source: Ambulatory Visit | Attending: Vascular Surgery | Admitting: Vascular Surgery

## 2018-05-23 ENCOUNTER — Ambulatory Visit (HOSPITAL_COMMUNITY)
Admission: RE | Admit: 2018-05-23 | Discharge: 2018-05-23 | Disposition: A | Discharge status: Home / Self Care | Payer: BLUE CROSS/BLUE SHIELD | Source: Ambulatory Visit | Attending: Vascular Surgery | Admitting: Vascular Surgery

## 2018-05-23 DIAGNOSIS — I779 Disorder of arteries and arterioles, unspecified: Secondary | ICD-10-CM

## 2018-05-26 ENCOUNTER — Encounter (HOSPITAL_COMMUNITY): Payer: BLUE CROSS/BLUE SHIELD

## 2018-05-27 ENCOUNTER — Encounter: Payer: Self-pay | Admitting: Vascular Surgery

## 2018-05-27 ENCOUNTER — Other Ambulatory Visit: Payer: Self-pay

## 2018-05-27 ENCOUNTER — Ambulatory Visit (INDEPENDENT_AMBULATORY_CARE_PROVIDER_SITE_OTHER): Payer: BLUE CROSS/BLUE SHIELD | Admitting: Vascular Surgery

## 2018-05-27 ENCOUNTER — Ambulatory Visit: Payer: BLUE CROSS/BLUE SHIELD | Admitting: Vascular Surgery

## 2018-05-27 DIAGNOSIS — I70212 Atherosclerosis of native arteries of extremities with intermittent claudication, left leg: Secondary | ICD-10-CM | POA: Diagnosis not present

## 2018-05-27 DIAGNOSIS — I779 Disorder of arteries and arterioles, unspecified: Secondary | ICD-10-CM

## 2018-05-27 DIAGNOSIS — Z87891 Personal history of nicotine dependence: Secondary | ICD-10-CM

## 2018-05-27 DIAGNOSIS — I739 Peripheral vascular disease, unspecified: Secondary | ICD-10-CM

## 2018-05-27 DIAGNOSIS — I70219 Atherosclerosis of native arteries of extremities with intermittent claudication, unspecified extremity: Secondary | ICD-10-CM | POA: Insufficient documentation

## 2018-05-27 DIAGNOSIS — I771 Stricture of artery: Secondary | ICD-10-CM

## 2018-05-27 NOTE — Progress Notes (Signed)
Patient name: Brianna Powers MRN: [id number] DOB: [birth year] Sex: female  REASON FOR VISIT: Recurrent left leg claudication  HPI: Brianna Powers is a 53 y.o. female with history of diabetes and peripheral vascular disease that presents for follow-up to discuss recurrent left leg claudication.  Patient has extensive history of iliac disease and initially underwent bilateral common iliac stenting years ago in 2016 by Dr. Donnetta Hutching.  These were subsequently relined in 2018 by Dr. Donzetta Matters with 7 x 21 VBX.  I then relined her iliac stents a second time on 11/28/2017 with a 7 x 59 VBX on the right and a 7 x 79 VBX on the left.  She reports about one month ago she had recurrent left leg claudication.  She describes it as burning in her left calf and in her buttock particularly when walking up hills or going up stairs.  She came in here and had a duplex that showed that the distal left iliac stent appears occluded.  She denies any previous lower extremity revascularizations.  No previous abdominal surgery.  States she quit smoking years ago at least 5 years ago.  Past Medical History:  Diagnosis Date  . Diabetes mellitus without complication ([place])   . Hypothyroidism   . Peripheral vascular disease [facility])     Past Surgical History:  Procedure Laterality Date  . ABDOMINAL AORTOGRAM N/A 05/02/2016   Procedure: Abdominal Aortogram;  Surgeon: Waynetta Sandy, MD;  Location: [place] [facility];  Service: Cardiovascular;  Laterality: N/A;  . ABDOMINAL AORTOGRAM W/LOWER EXTREMITY N/A 11/28/2017   Procedure: ABDOMINAL AORTOGRAM W/LOWER EXTREMITY;  Surgeon: Marty Heck, MD;  Location: [place] [facility];  Service: Cardiovascular;  Laterality: N/A;  Bilatertal  . FEMORAL ARTERY EXPLORATION Bilateral 01/28/2015   Procedure: BILATERAL FEMORAL ARTERY EXPLORATION;  Surgeon: Rosetta Posner, MD;  Location: [place];  Service: Vascular;  Laterality: Bilateral;  . INSERTION OF ILIAC STENT Bilateral 01/28/2015   Procedure: INSERTION OF RIGHT AND LEFT ILIAC STENT;  Surgeon: Rosetta Posner, MD;  Location: [place];  Service: Vascular;  Laterality: Bilateral;  . LOWER EXTREMITY ANGIOGRAPHY N/A 05/02/2016   Procedure: Bilateral Illac Stent;  Surgeon: Waynetta Sandy, MD;  Location: [place] [facility];  Service: Cardiovascular;  Laterality: N/A;  . PERIPHERAL VASCULAR CATHETERIZATION N/A 01/26/2015   Procedure: Abdominal Aortogram;  Surgeon: Rosetta Posner, MD;  Location: [place] [facility];  Service: Cardiovascular;  Laterality: N/A;  . PERIPHERAL VASCULAR CATHETERIZATION N/A 04/19/2016   Procedure: Abdominal Aortogram w/Lower Extremity;  Surgeon: Conrad Hammon, MD;  Location: [place] [facility];  Service: Cardiovascular;  Laterality: N/A;  . PERIPHERAL VASCULAR INTERVENTION Bilateral 11/28/2017   Procedure: PERIPHERAL VASCULAR INTERVENTION;  Surgeon: Marty Heck, MD;  Location: [place] [facility];  Service: Cardiovascular;  Laterality: Bilateral;  Iliac    Family History  Problem Relation Age of Onset  . CAD Father   . Diabetes type II Father     SOCIAL HISTORY: Social History   Tobacco Use  . Smoking status: Former Smoker    Last attempt to quit: 05/25/2014    Years since quitting: 4.0  . Smokeless tobacco: Never Used  Substance Use Topics  . Alcohol use: No    Alcohol/week: 0.0 standard drinks    Allergies  Allergen Reactions  . Amoxicillin Rash and Other (See Comments)    Panic attacks.     Current Outpatient Medications  Medication Sig Dispense Refill  . ACCU-CHEK AVIVA PLUS test  strip TEST twice a day 50 each PRN  . ACCU-CHEK SOFTCLIX LANCETS lancets TEST twice a day 100 each PRN  . acetaminophen (TYLENOL) 500 MG tablet Take 1,000 mg by mouth 2 (two) times daily as needed for moderate pain.    Marland Kitchen clopidogrel (PLAVIX) 75 MG tablet Take 1 tablet (75 mg total) by mouth daily. 30 tablet 3  . diphenhydrAMINE-zinc acetate (BENADRYL) cream Apply 1 application topically 2 (two) times  daily as needed for itching.    . gabapentin (NEURONTIN) 100 MG capsule Take 1 capsule (100 mg total) by mouth 3 (three) times daily. 20 capsule 0  . glucose monitoring kit (FREESTYLE) monitoring kit 1 each by Does not apply route as needed for other. Use as directed 1 each 0  . levothyroxine (SYNTHROID, LEVOTHROID) 75 MCG tablet Take 75 mcg by mouth daily before breakfast.    . predniSONE (STERAPRED UNI-PAK 21 TAB) 10 MG (21) TBPK tablet 40 mg by mouth daily for 7 days    . rosuvastatin (CRESTOR) 10 MG tablet Take 10 mg by mouth daily.    . traMADol (ULTRAM) 50 MG tablet Take 1 tablet (50 mg total) by mouth every 6 (six) hours as needed. (Patient taking differently: Take 50 mg by mouth every 6 (six) hours as needed for moderate pain. ) 20 tablet 0   No current facility-administered medications for this visit.     REVIEW OF SYSTEMS:  [X]  denotes positive finding, [ ]  denotes negative finding Cardiac  Comments:  Chest pain or chest pressure:    Shortness of breath upon exertion:    Short of breath when lying flat:    Irregular heart rhythm:        Vascular    Pain in calf, thigh, or hip brought on by ambulation: x Left leg  Pain in feet at night that wakes you up from your sleep:     Blood clot in your veins:    Leg swelling:         Pulmonary    Oxygen at home:    Productive cough:     Wheezing:         Neurologic    Sudden weakness in arms or legs:     Sudden numbness in arms or legs:     Sudden onset of difficulty speaking or slurred speech:    Temporary loss of vision in one eye:     Problems with dizziness:         Gastrointestinal    Blood in stool:     Vomited blood:         Genitourinary    Burning when urinating:     Blood in urine:        Psychiatric    Major depression:         Hematologic    Bleeding problems:    Problems with blood clotting too easily:        Skin    Rashes or ulcers:        Constitutional    Fever or chills:      PHYSICAL  EXAM: Vitals:   05/27/18 0828  BP: (!) 168/94  Pulse: 89  Resp: 14  Temp: (!) 97.2 F (36.2 C)  TempSrc: Oral  SpO2: 97%  Weight: 164 lb 9.2 oz (74.6 kg)  Height: 5' 1"  (1.549 m)    GENERAL: The patient is a well-nourished female, in no acute distress. The vital signs are documented above. CARDIAC: There is a  regular rate and rhythm.  VASCULAR:  1+ right femoral pulse palpable No left femoral pulse palpable Biphasic right PT signal Monophasic left PT signal No tissue loss, feet warm PULMONARY: There is good air exchange bilaterally without wheezing or rales. ABDOMEN: Soft and non-tender with normal pitched bowel sounds.  MUSCULOSKELETAL: There are no major deformities or cyanosis. NEUROLOGIC: No focal weakness or paresthesias are detected. SKIN: There are no ulcers or rashes noted. PSYCHIATRIC: The patient has a normal affect.  DATA:   I independently reviewed her noninvasive imaging that shows ABI of 1.02 on the right with a biphasic signal and 0.71 on the left with monophasic signal  Right distal stent has a velocity of 217 suggestive of a greater than 50% stenosis, the left distal iliac stent appears occluded  Assessment/Plan:  53 yo F with recurrent left leg claudication and now her left iliac stent appears occluded.  Discussed with Brianna Powers in detail that she has had her initial iliac stents relined twice.  The last relining was 11/28/2017 with a 7 x 59 VBX on the right and a 7 x 79 VBX on the left.  It now appears she has recurrent claudication on the left with evidence of an occluded distal left iliac stent.  I do not think taking her back for a third relinment at her young age of 52 is the best decision.  I discussed that she probably would benefit from an aortobifemoral bypass for long-term durability.  We will plan to get CTA abdomen pelvis with runoff for operative planning as well as an updated echocardiogram.  I will plan to see her back in the office for operative  planning.   Marty Heck, MD [facility] Office: [phone] Pager: [phone]

## 2018-06-13 ENCOUNTER — Ambulatory Visit (HOSPITAL_BASED_OUTPATIENT_CLINIC_OR_DEPARTMENT_OTHER)
Admission: RE | Admit: 2018-06-13 | Discharge: 2018-06-13 | Disposition: A | Discharge status: Home / Self Care | Payer: BLUE CROSS/BLUE SHIELD | Source: Ambulatory Visit | Attending: Vascular Surgery | Admitting: Vascular Surgery

## 2018-06-13 ENCOUNTER — Other Ambulatory Visit: Payer: Self-pay

## 2018-06-13 ENCOUNTER — Ambulatory Visit (HOSPITAL_COMMUNITY)
Admission: RE | Admit: 2018-06-13 | Discharge: 2018-06-13 | Disposition: A | Discharge status: Home / Self Care | Payer: BLUE CROSS/BLUE SHIELD | Source: Ambulatory Visit | Attending: Vascular Surgery | Admitting: Vascular Surgery

## 2018-06-13 DIAGNOSIS — I739 Peripheral vascular disease, unspecified: Secondary | ICD-10-CM

## 2018-06-13 DIAGNOSIS — I771 Stricture of artery: Secondary | ICD-10-CM

## 2018-06-13 DIAGNOSIS — Z87891 Personal history of nicotine dependence: Secondary | ICD-10-CM | POA: Diagnosis not present

## 2018-06-13 DIAGNOSIS — I779 Disorder of arteries and arterioles, unspecified: Secondary | ICD-10-CM

## 2018-06-13 MED ORDER — IOHEXOL 350 MG/ML SOLN
100.0000 mL | Freq: Once | INTRAVENOUS | Status: AC | PRN
  Administered 2018-06-13: 100 mL via INTRAVENOUS

## 2018-06-13 NOTE — Progress Notes (Signed)
  Echocardiogram 2D Echocardiogram has been performed.  Gerda Diss 06/13/2018, 3:34 PM

## 2018-06-16 ENCOUNTER — Telehealth: Payer: Self-pay | Admitting: Vascular Surgery

## 2018-06-16 NOTE — Telephone Encounter (Signed)
The above patient or their representative was contacted and gave the following answers to these questions:         Do you have any of the following symptoms?  Fever                    Cough                   Shortness of breath  Do  you have any of the following other symptoms?    muscle pain         vomiting,        diarrhea        rash         weakness        red eye        abdominal pain         bruising          bruising or bleeding              joint pain           severe headache    Have you been in contact with someone who was or has been sick in the past 2 weeks?  no  Does the person that you were in contact with have any of the following symptoms? no  Cough         shortness of breath           muscle pain         vomiting,            diarrhea            rash            weakness           fever            red eye           abdominal pain           bruising  or  bleeding                joint pain                severe headache               Have you  or someone you have been in contact with traveled internationally in th last month?         no  Have you  or someone you have been in contact with traveled outside [place] in th last month?         no  COMMENTS OR ACTION PLAN FOR THIS PATIENT:

## 2018-06-17 ENCOUNTER — Encounter: Payer: Self-pay | Admitting: *Deleted

## 2018-06-17 ENCOUNTER — Telehealth: Payer: Self-pay | Admitting: *Deleted

## 2018-06-17 ENCOUNTER — Ambulatory Visit: Payer: BLUE CROSS/BLUE SHIELD | Admitting: Vascular Surgery

## 2018-06-17 ENCOUNTER — Encounter: Payer: Self-pay | Admitting: Vascular Surgery

## 2018-06-17 ENCOUNTER — Ambulatory Visit (INDEPENDENT_AMBULATORY_CARE_PROVIDER_SITE_OTHER): Payer: BLUE CROSS/BLUE SHIELD | Admitting: Vascular Surgery

## 2018-06-17 ENCOUNTER — Other Ambulatory Visit: Payer: Self-pay | Admitting: *Deleted

## 2018-06-17 ENCOUNTER — Other Ambulatory Visit: Payer: Self-pay | Admitting: Vascular Surgery

## 2018-06-17 ENCOUNTER — Other Ambulatory Visit: Payer: Self-pay

## 2018-06-17 VITALS — BP 120/80 | HR 64 | Temp 97.5°F | Resp 14 | Ht 60.0 in | Wt 163.0 lb

## 2018-06-17 DIAGNOSIS — I70212 Atherosclerosis of native arteries of extremities with intermittent claudication, left leg: Secondary | ICD-10-CM | POA: Diagnosis not present

## 2018-06-17 MED ORDER — TRAMADOL HCL 50 MG PO TABS: 50.0000 mg | ORAL_TABLET | Freq: Four times a day (QID) | ORAL | 0 refills | Status: DC | PRN

## 2018-06-17 NOTE — Telephone Encounter (Signed)
Called and reminded patient she is to hold Plavix for 5 days prior to surgery scheduled for 08/04/2018.Hold after 07/29/2018 dose.

## 2018-06-17 NOTE — Progress Notes (Signed)
Patient name: Brianna Powers MRN: [id number] DOB: 1966/02/12 Sex: female  REASON FOR VISIT: Recurrent left leg claudication  HPI: Brianna Powers is a 53 y.o. female with history of diabetes and peripheral vascular disease that presents for follow-up to discuss recurrent left leg claudication.  Patient has extensive history of iliac disease and initially underwent bilateral common iliac stenting years ago in 2016 by Dr. Donnetta Hutching.  These were subsequently relined in 2018 by Dr. Donzetta Matters with 7 x 62 VBX.  I then relined her iliac stents a second time on 11/28/2017 with a 7 x 59 VBX on the right and a 7 x 79 VBX on the left.  Now having recurrent left leg claudication and left iliac stent occluded.  No new issues on follow-up today.  Had CTA abd/pelvis and echo.  Past Medical History:  Diagnosis Date  . Diabetes mellitus without complication ([place])   . Hypothyroidism   . Peripheral vascular disease [facility])     Past Surgical History:  Procedure Laterality Date  . ABDOMINAL AORTOGRAM N/A 05/02/2016   Procedure: Abdominal Aortogram;  Surgeon: Waynetta Sandy, MD;  Location: [place] [facility];  Service: Cardiovascular;  Laterality: N/A;  . ABDOMINAL AORTOGRAM W/LOWER EXTREMITY N/A 11/28/2017   Procedure: ABDOMINAL AORTOGRAM W/LOWER EXTREMITY;  Surgeon: Marty Heck, MD;  Location: [place] [facility];  Service: Cardiovascular;  Laterality: N/A;  Bilatertal  . FEMORAL ARTERY EXPLORATION Bilateral 01/28/2015   Procedure: BILATERAL FEMORAL ARTERY EXPLORATION;  Surgeon: Rosetta Posner, MD;  Location: [place];  Service: Vascular;  Laterality: Bilateral;  . INSERTION OF ILIAC STENT Bilateral 01/28/2015   Procedure: INSERTION OF RIGHT AND LEFT ILIAC STENT;  Surgeon: Rosetta Posner, MD;  Location: [place];  Service: Vascular;  Laterality: Bilateral;  . LOWER EXTREMITY ANGIOGRAPHY N/A 05/02/2016   Procedure: Bilateral Illac Stent;  Surgeon: Waynetta Sandy, MD;  Location: [place] [facility];  Service:  Cardiovascular;  Laterality: N/A;  . PERIPHERAL VASCULAR CATHETERIZATION N/A 01/26/2015   Procedure: Abdominal Aortogram;  Surgeon: Rosetta Posner, MD;  Location: [place] [facility];  Service: Cardiovascular;  Laterality: N/A;  . PERIPHERAL VASCULAR CATHETERIZATION N/A 04/19/2016   Procedure: Abdominal Aortogram w/Lower Extremity;  Surgeon: Conrad Red Willow, MD;  Location: [place] [facility];  Service: Cardiovascular;  Laterality: N/A;  . PERIPHERAL VASCULAR INTERVENTION Bilateral 11/28/2017   Procedure: PERIPHERAL VASCULAR INTERVENTION;  Surgeon: Marty Heck, MD;  Location: [place] [facility];  Service: Cardiovascular;  Laterality: Bilateral;  Iliac    Family History  Problem Relation Age of Onset  . CAD Father   . Diabetes type II Father     SOCIAL HISTORY: Social History   Tobacco Use  . Smoking status: Former Smoker    Last attempt to quit: 05/25/2014    Years since quitting: 4.0  . Smokeless tobacco: Never Used  Substance Use Topics  . Alcohol use: No    Alcohol/week: 0.0 standard drinks    Allergies  Allergen Reactions  . Amoxicillin Rash and Other (See Comments)    Panic attacks.     Current Outpatient Medications  Medication Sig Dispense Refill  . acetaminophen (TYLENOL) 500 MG tablet Take 1,000 mg by mouth 2 (two) times daily as needed for moderate pain.    Marland Kitchen clopidogrel (PLAVIX) 75 MG tablet Take 1 tablet (75 mg total) by mouth daily. 30 tablet 3  . diphenhydrAMINE-zinc acetate (BENADRYL) cream Apply 1 application topically 2 (two) times daily as needed for itching.    Marland Kitchen  gabapentin (NEURONTIN) 100 MG capsule Take 1 capsule (100 mg total) by mouth 3 (three) times daily. 20 capsule 0  . levothyroxine (SYNTHROID, LEVOTHROID) 75 MCG tablet Take 75 mcg by mouth daily before breakfast.    . predniSONE (STERAPRED UNI-PAK 21 TAB) 10 MG (21) TBPK tablet 40 mg by mouth daily for 7 days    . rosuvastatin (CRESTOR) 10 MG tablet Take 10 mg by mouth daily.    . traMADol (ULTRAM)  50 MG tablet Take 1 tablet (50 mg total) by mouth every 6 (six) hours as needed. (Patient taking differently: Take 50 mg by mouth every 6 (six) hours as needed for moderate pain. ) 20 tablet 0  . ACCU-CHEK AVIVA PLUS test strip TEST twice a day (Patient not taking: Reported on 06/17/2018) 50 each PRN  . ACCU-CHEK SOFTCLIX LANCETS lancets TEST twice a day (Patient not taking: Reported on 06/17/2018) 100 each PRN  . glucose monitoring kit (FREESTYLE) monitoring kit 1 each by Does not apply route as needed for other. Use as directed (Patient not taking: Reported on 06/17/2018) 1 each 0  . traMADol (ULTRAM) 50 MG tablet Take 1 tablet (50 mg total) by mouth every 6 (six) hours as needed. 25 tablet 0   No current facility-administered medications for this visit.     REVIEW OF SYSTEMS:  [X]  denotes positive finding, [ ]  denotes negative finding Cardiac  Comments:  Chest pain or chest pressure:    Shortness of breath upon exertion:    Short of breath when lying flat:    Irregular heart rhythm:        Vascular    Pain in calf, thigh, or hip brought on by ambulation: x Left leg  Pain in feet at night that wakes you up from your sleep:     Blood clot in your veins:    Leg swelling:         Pulmonary    Oxygen at home:    Productive cough:     Wheezing:         Neurologic    Sudden weakness in arms or legs:     Sudden numbness in arms or legs:     Sudden onset of difficulty speaking or slurred speech:    Temporary loss of vision in one eye:     Problems with dizziness:         Gastrointestinal    Blood in stool:     Vomited blood:         Genitourinary    Burning when urinating:     Blood in urine:        Psychiatric    Major depression:         Hematologic    Bleeding problems:    Problems with blood clotting too easily:        Skin    Rashes or ulcers:        Constitutional    Fever or chills:      PHYSICAL EXAM: Vitals:   06/17/18 1052  BP: 120/80  Pulse: 64  Resp:  14  Temp: (!) 97.5 F (36.4 C)  TempSrc: Oral  SpO2: 99%  Weight: 163 lb (73.9 kg)  Height: 5' (1.524 m)    GENERAL: The patient is a well-nourished female, in no acute distress. The vital signs are documented above. CARDIAC: There is a regular rate and rhythm.  VASCULAR:  1+ right femoral pulse palpable No left femoral pulse palpable Biphasic right PT  signal Monophasic left PT signal No tissue loss, feet warm PULMONARY: There is good air exchange bilaterally without wheezing or rales. ABDOMEN: Soft and non-tender with normal pitched bowel sounds.  MUSCULOSKELETAL: There are no major deformities or cyanosis. NEUROLOGIC: No focal weakness or paresthesias are detected. SKIN: There are no ulcers or rashes noted. PSYCHIATRIC: The patient has a normal affect.  DATA:   I independently reviewed her noninvasive imaging that shows ABI of 1.02 on the right with a biphasic signal and 0.71 on the left with monophasic signal  Right distal stent has a velocity of 217 suggestive of a greater than 50% stenosis, the left distal iliac stent appears occluded  Echo 06/13/2018: Left ventricular systolic function is normal with an EF of 55 to 60%.  CTA 06/13/17: Left iliac stent occluded - inflammation around stent likely from occlusion, right iliac stent patent, common femorals/SFA/profunda patent bilaterally   Assessment/Plan:  53 yo F with recurrent left leg claudication and now her left iliac stent appears occluded.  Discussed with Mrs. Brackin in detail that she has had her initial iliac stents relined twice.  The last relining was 11/28/2017 with a 7 x 59 VBX on the right and a 7 x 79 VBX on the left.  It now appears she has recurrent claudication on the left with evidence of an occluded distal left iliac stent.    Review of her CTA abdomen pelvis as well as echocardiogram and think she is a candidate for open aortic surgery.  I will schedule her for an aortobifemoral bypass in early May when the  coronavirus pandemic hopefully allow some non-emergent/urgent surgery to proceed.  Marty Heck, MD [facility] Office: [phone] Pager: [phone]

## 2018-07-22 ENCOUNTER — Other Ambulatory Visit: Payer: Self-pay | Admitting: *Deleted

## 2018-07-22 DIAGNOSIS — I739 Peripheral vascular disease, unspecified: Secondary | ICD-10-CM

## 2018-07-22 MED ORDER — CLOPIDOGREL BISULFATE 75 MG PO TABS: 75.0000 mg | ORAL_TABLET | Freq: Every day | ORAL | 3 refills | Status: DC

## 2018-07-28 ENCOUNTER — Other Ambulatory Visit: Payer: Self-pay | Admitting: *Deleted

## 2018-07-29 ENCOUNTER — Inpatient Hospital Stay (HOSPITAL_COMMUNITY): Admission: RE | Admit: 2018-07-29 | Payer: BLUE CROSS/BLUE SHIELD | Source: Ambulatory Visit

## 2018-08-01 ENCOUNTER — Other Ambulatory Visit: Payer: Self-pay | Admitting: *Deleted

## 2018-08-15 NOTE — Pre-Procedure Instructions (Addendum)
Mahaila Flanigan Mask  08/15/2018      [id number] - [place], [place] - [address] AT [facility] [address] [place] [address] Phone: [phone] Fax: [phone]    Your procedure is scheduled on June 1.  Report to [facility] Entrance A at 5:30 A.M.  Call this number if you have problems the morning of surgery:  [phone]   Remember:  Do not eat or drink after midnight.      Take these medicines the morning of surgery with A SIP OF WATER :             Tylenol if needed            Levothyroxine (synthroid)            Tramadol if needed             last dose plavix on May 26            7 days prior to surgery STOP taking any Aspirin (unless otherwise instructed by your surgeon), Aleve, Naproxen, Ibuprofen, Motrin, Advil, Goody's, BC's, all herbal medications, fish oil, and all vitamins.                  . Do not wear  jewelry, make-up or nail polish.  Do not wear lotions, powders, or perfumes, or deodorant.  Do not shave 48 hours prior to surgery.  Men may shave face and neck.  Do not bring valuables to the hospital.  [facility] is not responsible for any belongings or valuables.  Contacts, dentures or bridgework may not be worn into surgery.  Leave your suitcase in the car.  After surgery it may be brought to your room.  For patients admitted to the hospital, discharge time will be determined by your treatment team.  Patients discharged the day of surgery will not be allowed to drive home.    Special instructions:   [facility]- Preparing For Surgery  Before surgery, you can play an important role. Because skin is not sterile, your skin needs to be as free of germs as possible. You can reduce the number of germs on your skin by washing with CHG (chlorahexidine gluconate) Soap before surgery.  CHG is an antiseptic cleaner which kills germs and bonds with the skin to continue killing germs even after washing.    Oral Hygiene is also important  to reduce your risk of infection.  Remember - BRUSH YOUR TEETH THE MORNING OF SURGERY WITH YOUR REGULAR TOOTHPASTE  Please do not use if you have an allergy to CHG or antibacterial soaps. If your skin becomes reddened/irritated stop using the CHG.  Do not shave (including legs and underarms) for at least 48 hours prior to first CHG shower. It is OK to shave your face.  Please follow these instructions carefully.   1. Shower the NIGHT BEFORE SURGERY and the MORNING OF SURGERY with CHG.   2. If you chose to wash your hair, wash your hair first as usual with your normal shampoo.  3. After you shampoo, rinse your hair and body thoroughly to remove the shampoo.  4. Use CHG as you would any other liquid soap. You can apply CHG directly to the skin and wash gently with a scrungie or a clean washcloth.   5. Apply the CHG Soap to your body ONLY FROM THE NECK DOWN.  Do not use on open wounds or open sores. Avoid contact with your eyes, ears,  mouth and genitals (private parts). Wash Face and genitals (private parts)  with your normal soap.  6. Wash thoroughly, paying special attention to the area where your surgery will be performed.  7. Thoroughly rinse your body with warm water from the neck down.  8. DO NOT shower/wash with your normal soap after using and rinsing off the CHG Soap.  9. Pat yourself dry with a CLEAN TOWEL.  10. Wear CLEAN PAJAMAS to bed the night before surgery, wear comfortable clothes the morning of surgery  11. Place CLEAN SHEETS on your bed the night of your first shower and DO NOT SLEEP WITH PETS.    Day of Surgery:  Do not apply any deodorants/lotions.  Please wear clean clothes to the hospital/surgery center.   Remember to brush your teeth WITH YOUR REGULAR TOOTHPASTE.    Please read over the following fact sheets that you were given. Coughing and Deep Breathing, MRSA Information and Surgical Site Infection Prevention

## 2018-08-19 ENCOUNTER — Encounter (HOSPITAL_COMMUNITY): Payer: Self-pay

## 2018-08-19 ENCOUNTER — Other Ambulatory Visit: Payer: Self-pay

## 2018-08-19 ENCOUNTER — Encounter (HOSPITAL_COMMUNITY)
Admission: RE | Admit: 2018-08-19 | Discharge: 2018-08-19 | Disposition: A | Discharge status: Home / Self Care | Payer: BLUE CROSS/BLUE SHIELD | Source: Ambulatory Visit | Attending: Vascular Surgery | Admitting: Vascular Surgery

## 2018-08-19 DIAGNOSIS — Z01818 Encounter for other preprocedural examination: Secondary | ICD-10-CM | POA: Insufficient documentation

## 2018-08-19 DIAGNOSIS — Z79890 Hormone replacement therapy (postmenopausal): Secondary | ICD-10-CM | POA: Insufficient documentation

## 2018-08-19 DIAGNOSIS — Z87891 Personal history of nicotine dependence: Secondary | ICD-10-CM | POA: Diagnosis not present

## 2018-08-19 DIAGNOSIS — Z79899 Other long term (current) drug therapy: Secondary | ICD-10-CM | POA: Insufficient documentation

## 2018-08-19 DIAGNOSIS — I739 Peripheral vascular disease, unspecified: Secondary | ICD-10-CM | POA: Insufficient documentation

## 2018-08-19 DIAGNOSIS — E039 Hypothyroidism, unspecified: Secondary | ICD-10-CM | POA: Diagnosis not present

## 2018-08-19 LAB — URINALYSIS, ROUTINE W REFLEX MICROSCOPIC
Bilirubin Urine: NEGATIVE
Glucose, UA: NEGATIVE mg/dL
Hgb urine dipstick: NEGATIVE
Ketones, ur: NEGATIVE mg/dL
Leukocytes,Ua: NEGATIVE
Nitrite: NEGATIVE
Protein, ur: NEGATIVE mg/dL
Specific Gravity, Urine: 1.009 (ref 1.005–1.030)
pH: 5 (ref 5.0–8.0)

## 2018-08-19 LAB — CBC
HCT: 39.1 % (ref 36.0–46.0)
Hemoglobin: 11.9 g/dL — ABNORMAL LOW (ref 12.0–15.0)
MCH: 27.4 pg (ref 26.0–34.0)
MCHC: 30.4 g/dL (ref 30.0–36.0)
MCV: 90.1 fL (ref 80.0–100.0)
Platelets: 423 10*3/uL — ABNORMAL HIGH (ref 150–400)
RBC: 4.34 MIL/uL (ref 3.87–5.11)
RDW: 13.9 % (ref 11.5–15.5)
WBC: 11.1 10*3/uL — ABNORMAL HIGH (ref 4.0–10.5)
nRBC: 0 % (ref 0.0–0.2)

## 2018-08-19 LAB — COMPREHENSIVE METABOLIC PANEL
ALT: 17 U/L (ref 0–44)
AST: 14 U/L — ABNORMAL LOW (ref 15–41)
Albumin: 3.2 g/dL — ABNORMAL LOW (ref 3.5–5.0)
Alkaline Phosphatase: 128 U/L — ABNORMAL HIGH (ref 38–126)
Anion gap: 13 (ref 5–15)
BUN: 17 mg/dL (ref 6–20)
CO2: 23 mmol/L (ref 22–32)
Calcium: 9.5 mg/dL (ref 8.9–10.3)
Chloride: 102 mmol/L (ref 98–111)
Creatinine, Ser: 1.02 mg/dL — ABNORMAL HIGH (ref 0.44–1.00)
GFR calc Af Amer: >60 mL/min (ref >60)
GFR calc non Af Amer: >60 mL/min (ref >60)
Glucose, Bld: 104 mg/dL — ABNORMAL HIGH (ref 70–99)
Potassium: 3.9 mmol/L (ref 3.5–5.1)
Sodium: 138 mmol/L (ref 135–145)
Total Bilirubin: 0.4 mg/dL (ref 0.3–1.2)
Total Protein: 7.5 g/dL (ref 6.5–8.1)

## 2018-08-19 LAB — APTT: aPTT: 30 s (ref 24–36)

## 2018-08-19 LAB — ABO/RH: ABO/RH(D): A POS

## 2018-08-19 LAB — PROTIME-INR
INR: 1 (ref 0.8–1.2)
Prothrombin Time: 13.2 seconds (ref 11.4–15.2)

## 2018-08-19 LAB — SURGICAL PCR SCREEN
MRSA, PCR: NEGATIVE
Staphylococcus aureus: NEGATIVE

## 2018-08-19 LAB — PREPARE RBC (CROSSMATCH)

## 2018-08-19 NOTE — Progress Notes (Signed)
PCP - Kathleene Hazel. Hall in [place] Cardiologist - na  Chest x-ray - na EKG - 11/28/17 Stress Test - na ECHO - 06-13-18 Cardiac Cath - na  Sleep Study - na CPAP -   Fasting Blood Sugar - na, pt. States she has been off meidcations for years and doesn't check sugars Checks Blood Sugar _____ times a day  Blood Thinner Instructions: last dose of plavix 08/19/18 Aspirin Instructions:  Anesthesia review:   Patient denies shortness of breath, fever, cough and chest pain at PAT appointment   Patient verbalized understanding of instructions that were given to them at the PAT appointment. Patient was also instructed that they will need to review over the PAT instructions again at home before surgery.

## 2018-08-20 NOTE — Anesthesia Preprocedure Evaluation (Addendum)
Anesthesia Evaluation  Patient identified by MRN, date of birth, ID band Patient awake    Reviewed: Allergy & Precautions, NPO status , Patient's Chart, lab work & pertinent test results  Airway Mallampati: I  TM Distance: >3 FB Neck ROM: Full    Dental   Pulmonary former smoker,    Pulmonary exam normal        Cardiovascular Normal cardiovascular exam     Neuro/Psych    GI/Hepatic   Endo/Other    Renal/GU      Musculoskeletal   Abdominal   Peds  Hematology   Anesthesia Other Findings   Reproductive/Obstetrics                            Anesthesia Physical Anesthesia Plan  ASA: III  Anesthesia Plan: General   Post-op Pain Management:    Induction: Intravenous  PONV Risk Score and Plan: 3 and Ondansetron, Dexamethasone and Midazolam  Airway Management Planned: Oral ETT  Additional Equipment: Arterial line, CVP, PA Cath and Ultrasound Guidance Line Placement  Intra-op Plan:   Post-operative Plan: Possible Post-op intubation/ventilation  Informed Consent: I have reviewed the patients History and Physical, chart, labs and discussed the procedure including the risks, benefits and alternatives for the proposed anesthesia with the patient or authorized representative who has indicated his/her understanding and acceptance.       Plan Discussed with: CRNA and Surgeon  Anesthesia Plan Comments: (PAT note written 08/20/2018 by Shonna Chock, PA-C. )       Anesthesia Quick Evaluation

## 2018-08-20 NOTE — Progress Notes (Signed)
Anesthesia Chart Review:  Case:  [id number] Date/Time:  08/25/18 0715   Procedure:  AORTA BIFEMORAL BYPASS GRAFT (Bilateral )   Anesthesia type:  General   Pre-op diagnosis:  PERIPHERAL ARTERY DISEASE   Location:  [facility] OR ROOM 11 / [place] [facility]   Surgeon:  Brianna Heck, MD      DISCUSSION: Patient is a 53 year old female scheduled for the above procedure.  History includes former smoker (quit 2016), hypothyroidism, PVD (s/p bilateral CIA stents 01/28/15, 05/02/16, 11/28/17). She had reportedly had to monitor blood sugars in the past, but denied issues for "years." (Preoperative glucose 104.)  Per [facility], hold Plavix 5 days prior to surgery. Last dose 08/19/18. Recent echo ordered by Dr. Carlis Abbott showed normal LVEF. She denied SOB, cough, fever, and chest pain at PAT RN visit.  She is scheduled for COVID preoperative test on 08/22/18. If no acute changes then I would anticipate that she can proceed as planned.   VS: BP (P) 122/85   Pulse (P) 89   Temp (P) 36.8 C   PROVIDERS: Brianna Squibb, MD is PCP - She was seen by cardiologist Brianna Sable, MD on 04/07/18 for PAD.  No new recommendations at that time with six month follow-up; however, he referred her back to vascular surgery after she developed left leg pain on 05/21/18.  LABS: Labs reviewed: Acceptable for surgery. (all labs ordered are listed, but only abnormal results are displayed)  Labs Reviewed  CBC - Abnormal; Notable for the following components:      Result Value   WBC 11.1 (*)    Hemoglobin 11.9 (*)    Platelets 423 (*)    All other components within normal limits  COMPREHENSIVE METABOLIC PANEL - Abnormal; Notable for the following components:   Glucose, Bld 104 (*)    Creatinine, Ser 1.02 (*)    Albumin 3.2 (*)    AST 14 (*)    Alkaline Phosphatase 128 (*)    All other components within normal limits  URINALYSIS, ROUTINE W REFLEX MICROSCOPIC - Abnormal; Notable for the following components:   Color, Urine STRAW (*)    All  other components within normal limits  SURGICAL PCR SCREEN  APTT  PROTIME-INR  PREPARE RBC (CROSSMATCH)  TYPE AND SCREEN  ABO/RH    IMAGES: CTA Ao+Bifem 06/13/18: IMPRESSION: VASCULAR: 1. Aortitis just above the bifurcation. 2. Interval revision of LEFT common iliac artery stent, now occluded. 3. Patent RIGHT common iliac artery stent. NON-VASCULAR:. 1. 3.7 cm enhancing poorly marginated masslike process extending from the aortic bifurcation to the level of the left common iliac artery bifurcation. Given the adjacent aortitis and occluded left external common iliac artery stent, favor infectious/inflammatory phlegmon. Differential diagnosis includes retroperitoneal fibrosis, less likely lymphoma, regional spread of urothelial malignancy, or other neoplasm. Follow-up recommended. 2. Left hydronephrosis and ureterectasis, new since prior exam, extending to the left common iliac mass, with interval renal parenchymal atrophy suggesting a degree of chronicity.   EKG: 11/28/17: Normal sinus rhythm Low voltage QRS Borderline ECG Confirmed by Brianna Powers [phone]) on 11/28/2017 11:11:15 PM  CV: Echo 06/13/18: IMPRESSIONS  1. The left ventricle has normal systolic function, with an ejection fraction of 55-60%. The cavity size was normal. There is mildly increased left ventricular wall thickness. Left ventricular diastolic Doppler parameters are consistent with  pseudonormalization. No evidence of left ventricular regional wall motion abnormalities.  2. The right ventricle has normal systolic function. The cavity was normal. There is no increase in  right ventricular wall thickness.  3. No evidence of mitral valve stenosis. No mitral regurgitation.  4. The aortic valve is tricuspid no stenosis of the aortic valve.  5. The aortic root and ascending aorta are normal in size and structure.  6. The IVC was normal in size. No complete TR doppler jet so unable to estimate PA systolic  pressure.   Past Medical History:  Diagnosis Date  . Hypothyroidism   . Peripheral vascular disease [facility])     Past Surgical History:  Procedure Laterality Date  . ABDOMINAL AORTOGRAM N/A 05/02/2016   Procedure: Abdominal Aortogram;  Surgeon: Waynetta Sandy, MD;  Location: [place] [facility];  Service: Cardiovascular;  Laterality: N/A;  . ABDOMINAL AORTOGRAM W/LOWER EXTREMITY N/A 11/28/2017   Procedure: ABDOMINAL AORTOGRAM W/LOWER EXTREMITY;  Surgeon: Brianna Heck, MD;  Location: [place] [facility];  Service: Cardiovascular;  Laterality: N/A;  [place]  . FEMORAL ARTERY EXPLORATION Bilateral 01/28/2015   Procedure: BILATERAL FEMORAL ARTERY EXPLORATION;  Surgeon: Rosetta Posner, MD;  Location: [place];  Service: Vascular;  Laterality: Bilateral;  . INSERTION OF ILIAC STENT Bilateral 01/28/2015   Procedure: INSERTION OF RIGHT AND LEFT ILIAC STENT;  Surgeon: Rosetta Posner, MD;  Location: [place];  Service: Vascular;  Laterality: Bilateral;  . LOWER EXTREMITY ANGIOGRAPHY N/A 05/02/2016   Procedure: Bilateral Illac Stent;  Surgeon: Waynetta Sandy, MD;  Location: [place] [facility];  Service: Cardiovascular;  Laterality: N/A;  . PERIPHERAL VASCULAR CATHETERIZATION N/A 01/26/2015   Procedure: Abdominal Aortogram;  Surgeon: Rosetta Posner, MD;  Location: [place] [facility];  Service: Cardiovascular;  Laterality: N/A;  . PERIPHERAL VASCULAR CATHETERIZATION N/A 04/19/2016   Procedure: Abdominal Aortogram w/Lower Extremity;  Surgeon: Conrad [place], MD;  Location: [place] [facility];  Service: Cardiovascular;  Laterality: N/A;  . PERIPHERAL VASCULAR INTERVENTION Bilateral 11/28/2017   Procedure: PERIPHERAL VASCULAR INTERVENTION;  Surgeon: Brianna Heck, MD;  Location: [place] [facility];  Service: Cardiovascular;  Laterality: Bilateral;  Iliac    MEDICATIONS: . ACCU-CHEK AVIVA PLUS test strip  . ACCU-CHEK SOFTCLIX LANCETS lancets  . acetaminophen (TYLENOL) 500 MG tablet  .  clopidogrel (PLAVIX) 75 MG tablet  . gabapentin (NEURONTIN) 100 MG capsule  . glucose monitoring kit (FREESTYLE) monitoring kit  . levothyroxine (SYNTHROID, LEVOTHROID) 75 MCG tablet  . rosuvastatin (CRESTOR) 10 MG tablet  . traMADol (ULTRAM) 50 MG tablet   No current facility-administered medications for this encounter.     Myra Gianotti, PA-C Surgical Short Stay/Anesthesiology [facility] Phone [phone] [facility] Phone [phone] 08/20/2018 12:31 PM

## 2018-08-22 ENCOUNTER — Other Ambulatory Visit (HOSPITAL_COMMUNITY)
Admission: RE | Admit: 2018-08-22 | Discharge: 2018-08-22 | Disposition: A | Discharge status: Home / Self Care | Payer: BLUE CROSS/BLUE SHIELD | Source: Ambulatory Visit | Attending: Vascular Surgery | Admitting: Vascular Surgery

## 2018-08-22 DIAGNOSIS — Z0279 Encounter for issue of other medical certificate: Secondary | ICD-10-CM

## 2018-08-22 DIAGNOSIS — Z1159 Encounter for screening for other viral diseases: Secondary | ICD-10-CM | POA: Insufficient documentation

## 2018-08-22 LAB — SARS CORONAVIRUS 2 BY RT PCR (HOSPITAL ORDER, PERFORMED IN ~~LOC~~ HOSPITAL LAB): SARS Coronavirus 2: NEGATIVE

## 2018-08-25 ENCOUNTER — Inpatient Hospital Stay (HOSPITAL_COMMUNITY): Payer: BC Managed Care – PPO | Admitting: Registered Nurse

## 2018-08-25 ENCOUNTER — Encounter (HOSPITAL_COMMUNITY)
Admission: RE | Disposition: A | Discharge status: Home Health | Payer: Self-pay | Source: Home / Self Care | Attending: Vascular Surgery

## 2018-08-25 ENCOUNTER — Other Ambulatory Visit: Payer: Self-pay

## 2018-08-25 ENCOUNTER — Inpatient Hospital Stay (HOSPITAL_COMMUNITY): Payer: BC Managed Care – PPO

## 2018-08-25 ENCOUNTER — Inpatient Hospital Stay (HOSPITAL_COMMUNITY): Payer: BC Managed Care – PPO | Admitting: Vascular Surgery

## 2018-08-25 ENCOUNTER — Inpatient Hospital Stay (HOSPITAL_COMMUNITY)
Admission: RE | Admit: 2018-08-25 | Discharge: 2018-08-30 | DRG: 271 | Disposition: A | Discharge status: Home Health | Payer: BC Managed Care – PPO | Attending: Vascular Surgery | Admitting: Vascular Surgery

## 2018-08-25 ENCOUNTER — Encounter (HOSPITAL_COMMUNITY): Payer: Self-pay

## 2018-08-25 DIAGNOSIS — Z419 Encounter for procedure for purposes other than remedying health state, unspecified: Secondary | ICD-10-CM

## 2018-08-25 DIAGNOSIS — Z8249 Family history of ischemic heart disease and other diseases of the circulatory system: Secondary | ICD-10-CM | POA: Diagnosis not present

## 2018-08-25 DIAGNOSIS — T82856A Stenosis of peripheral vascular stent, initial encounter: Principal | ICD-10-CM | POA: Diagnosis present

## 2018-08-25 DIAGNOSIS — I739 Peripheral vascular disease, unspecified: Secondary | ICD-10-CM | POA: Diagnosis not present

## 2018-08-25 DIAGNOSIS — I70212 Atherosclerosis of native arteries of extremities with intermittent claudication, left leg: Secondary | ICD-10-CM | POA: Diagnosis present

## 2018-08-25 DIAGNOSIS — Y838 Other surgical procedures as the cause of abnormal reaction of the patient, or of later complication, without mention of misadventure at the time of the procedure: Secondary | ICD-10-CM | POA: Diagnosis present

## 2018-08-25 DIAGNOSIS — J984 Other disorders of lung: Secondary | ICD-10-CM | POA: Diagnosis not present

## 2018-08-25 DIAGNOSIS — E1151 Type 2 diabetes mellitus with diabetic peripheral angiopathy without gangrene: Secondary | ICD-10-CM | POA: Diagnosis present

## 2018-08-25 DIAGNOSIS — D62 Acute posthemorrhagic anemia: Secondary | ICD-10-CM | POA: Diagnosis not present

## 2018-08-25 DIAGNOSIS — I7409 Other arterial embolism and thrombosis of abdominal aorta: Secondary | ICD-10-CM | POA: Diagnosis not present

## 2018-08-25 DIAGNOSIS — I70219 Atherosclerosis of native arteries of extremities with intermittent claudication, unspecified extremity: Secondary | ICD-10-CM | POA: Diagnosis not present

## 2018-08-25 DIAGNOSIS — Z79899 Other long term (current) drug therapy: Secondary | ICD-10-CM

## 2018-08-25 DIAGNOSIS — Z4889 Encounter for other specified surgical aftercare: Secondary | ICD-10-CM | POA: Diagnosis not present

## 2018-08-25 DIAGNOSIS — E039 Hypothyroidism, unspecified: Secondary | ICD-10-CM | POA: Diagnosis present

## 2018-08-25 DIAGNOSIS — Z87891 Personal history of nicotine dependence: Secondary | ICD-10-CM | POA: Diagnosis not present

## 2018-08-25 DIAGNOSIS — J9811 Atelectasis: Secondary | ICD-10-CM | POA: Diagnosis not present

## 2018-08-25 DIAGNOSIS — Z7902 Long term (current) use of antithrombotics/antiplatelets: Secondary | ICD-10-CM | POA: Diagnosis not present

## 2018-08-25 DIAGNOSIS — Z833 Family history of diabetes mellitus: Secondary | ICD-10-CM

## 2018-08-25 DIAGNOSIS — I7410 Embolism and thrombosis of unspecified parts of aorta: Secondary | ICD-10-CM | POA: Diagnosis not present

## 2018-08-25 DIAGNOSIS — Z79891 Long term (current) use of opiate analgesic: Secondary | ICD-10-CM

## 2018-08-25 DIAGNOSIS — Z0389 Encounter for observation for other suspected diseases and conditions ruled out: Secondary | ICD-10-CM | POA: Diagnosis not present

## 2018-08-25 DIAGNOSIS — Z880 Allergy status to penicillin: Secondary | ICD-10-CM

## 2018-08-25 DIAGNOSIS — R509 Fever, unspecified: Secondary | ICD-10-CM

## 2018-08-25 DIAGNOSIS — Z9889 Other specified postprocedural states: Secondary | ICD-10-CM

## 2018-08-25 DIAGNOSIS — Z48812 Encounter for surgical aftercare following surgery on the circulatory system: Secondary | ICD-10-CM | POA: Diagnosis not present

## 2018-08-25 HISTORY — PX: AORTA - BILATERAL FEMORAL ARTERY BYPASS GRAFT: SHX1175

## 2018-08-25 LAB — BLOOD GAS, ARTERIAL
Acid-base deficit: 2.3 mmol/L — ABNORMAL HIGH (ref 0.0–2.0)
Bicarbonate: 22.7 mmol/L (ref 20.0–28.0)
O2 Saturation: 99.2 %
Patient temperature: 98.2
pCO2 arterial: 43.5 mmHg (ref 32.0–48.0)
pH, Arterial: 7.336 — ABNORMAL LOW (ref 7.350–7.450)
pO2, Arterial: 199 mmHg — ABNORMAL HIGH (ref 83.0–108.0)

## 2018-08-25 LAB — BASIC METABOLIC PANEL
Anion gap: 6 (ref 5–15)
BUN: 13 mg/dL (ref 6–20)
CO2: 23 mmol/L (ref 22–32)
Calcium: 8.1 mg/dL — ABNORMAL LOW (ref 8.9–10.3)
Chloride: 107 mmol/L (ref 98–111)
Creatinine, Ser: 0.97 mg/dL (ref 0.44–1.00)
GFR calc Af Amer: >60 mL/min (ref >60)
GFR calc non Af Amer: >60 mL/min (ref >60)
Glucose, Bld: 219 mg/dL — ABNORMAL HIGH (ref 70–99)
Potassium: 4 mmol/L (ref 3.5–5.1)
Sodium: 136 mmol/L (ref 135–145)

## 2018-08-25 LAB — CBC
HCT: 32.4 % — ABNORMAL LOW (ref 36.0–46.0)
Hemoglobin: 10.1 g/dL — ABNORMAL LOW (ref 12.0–15.0)
MCH: 27.4 pg (ref 26.0–34.0)
MCHC: 31.2 g/dL (ref 30.0–36.0)
MCV: 87.8 fL (ref 80.0–100.0)
Platelets: 234 10*3/uL (ref 150–400)
RBC: 3.69 MIL/uL — ABNORMAL LOW (ref 3.87–5.11)
RDW: 13.8 % (ref 11.5–15.5)
WBC: 21.5 10*3/uL — ABNORMAL HIGH (ref 4.0–10.5)
nRBC: 0 % (ref 0.0–0.2)

## 2018-08-25 LAB — MAGNESIUM: Magnesium: 1.7 mg/dL (ref 1.7–2.4)

## 2018-08-25 LAB — POCT ACTIVATED CLOTTING TIME
Activated Clotting Time: 274 seconds
Activated Clotting Time: 125 seconds
Activated Clotting Time: 213 seconds

## 2018-08-25 LAB — PROTIME-INR
INR: 1.3 — ABNORMAL HIGH (ref 0.8–1.2)
Prothrombin Time: 15.8 seconds — ABNORMAL HIGH (ref 11.4–15.2)

## 2018-08-25 LAB — APTT: aPTT: 27 seconds (ref 24–36)

## 2018-08-25 SURGERY — CREATION, BYPASS, ARTERIAL, AORTA TO FEMORAL, BILATERAL, USING GRAFT
Anesthesia: General | Laterality: Bilateral

## 2018-08-25 MED ORDER — 0.9 % SODIUM CHLORIDE (POUR BTL) OPTIME
TOPICAL | Status: DC | PRN
  Administered 2018-08-25 (×2): 2000 mL

## 2018-08-25 MED ORDER — PHENYLEPHRINE 40 MCG/ML (10ML) SYRINGE FOR IV PUSH (FOR BLOOD PRESSURE SUPPORT)
PREFILLED_SYRINGE | INTRAVENOUS | Status: DC | PRN
  Administered 2018-08-25 (×5): 80 ug via INTRAVENOUS

## 2018-08-25 MED ORDER — SODIUM CHLORIDE 0.9 % IV SOLN: INTRAVENOUS | Status: DC

## 2018-08-25 MED ORDER — HEMOSTATIC AGENTS (NO CHARGE) OPTIME
TOPICAL | Status: DC | PRN
  Administered 2018-08-25: 1 via TOPICAL

## 2018-08-25 MED ORDER — LABETALOL HCL 5 MG/ML IV SOLN: 10.0000 mg | INTRAVENOUS | Status: DC | PRN

## 2018-08-25 MED ORDER — HYDROMORPHONE HCL 1 MG/ML IJ SOLN
INTRAMUSCULAR | Status: AC
  Filled 2018-08-25: qty 0.5

## 2018-08-25 MED ORDER — PANTOPRAZOLE SODIUM 40 MG PO TBEC: 40.0000 mg | DELAYED_RELEASE_TABLET | Freq: Every day | ORAL | Status: DC

## 2018-08-25 MED ORDER — ROCURONIUM BROMIDE 50 MG/5ML IV SOSY
PREFILLED_SYRINGE | INTRAVENOUS | Status: DC | PRN
  Administered 2018-08-25: 50 mg via INTRAVENOUS
  Administered 2018-08-25: 30 mg via INTRAVENOUS
  Administered 2018-08-25: 50 mg via INTRAVENOUS
  Administered 2018-08-25: 20 mg via INTRAVENOUS

## 2018-08-25 MED ORDER — MAGNESIUM SULFATE 2 GM/50ML IV SOLN
2.0000 g | Freq: Every day | INTRAVENOUS | Status: DC | PRN
  Filled 2018-08-25 (×2): qty 50

## 2018-08-25 MED ORDER — ACETAMINOPHEN 325 MG PO TABS: 325.0000 mg | ORAL_TABLET | ORAL | Status: DC | PRN

## 2018-08-25 MED ORDER — ROCURONIUM BROMIDE 10 MG/ML (PF) SYRINGE
PREFILLED_SYRINGE | INTRAVENOUS | Status: AC
  Filled 2018-08-25: qty 10

## 2018-08-25 MED ORDER — SODIUM CHLORIDE 0.9 % IV SOLN
INTRAVENOUS | Status: AC
  Filled 2018-08-25: qty 1.2

## 2018-08-25 MED ORDER — KCL IN DEXTROSE-NACL 20-5-0.45 MEQ/L-%-% IV SOLN: INTRAVENOUS | Status: DC

## 2018-08-25 MED ORDER — PROTAMINE SULFATE 10 MG/ML IV SOLN
INTRAVENOUS | Status: AC
  Filled 2018-08-25: qty 5

## 2018-08-25 MED ORDER — PHENOL 1.4 % MT LIQD: 1.0000 | OROMUCOSAL | Status: DC | PRN

## 2018-08-25 MED ORDER — PROTAMINE SULFATE 10 MG/ML IV SOLN
INTRAVENOUS | Status: DC | PRN
  Administered 2018-08-25: 50 mg via INTRAVENOUS

## 2018-08-25 MED ORDER — BISACODYL 10 MG RE SUPP: 10.0000 mg | Freq: Every day | RECTAL | Status: DC | PRN

## 2018-08-25 MED ORDER — HYDRALAZINE HCL 20 MG/ML IJ SOLN: 5.0000 mg | INTRAMUSCULAR | Status: DC | PRN

## 2018-08-25 MED ORDER — ONDANSETRON HCL 4 MG/2ML IJ SOLN: 4.0000 mg | Freq: Four times a day (QID) | INTRAMUSCULAR | Status: DC | PRN

## 2018-08-25 MED ORDER — KCL IN DEXTROSE-NACL 20-5-0.45 MEQ/L-%-% IV SOLN
INTRAVENOUS | Status: DC
  Filled 2018-08-25: qty 1000

## 2018-08-25 MED ORDER — POTASSIUM CHLORIDE CRYS ER 20 MEQ PO TBCR
20.0000 meq | EXTENDED_RELEASE_TABLET | Freq: Every day | ORAL | Status: AC | PRN
  Administered 2018-08-29: 40 meq via ORAL
  Filled 2018-08-25: qty 2

## 2018-08-25 MED ORDER — HYDROMORPHONE HCL 1 MG/ML IJ SOLN
0.2500 mg | INTRAMUSCULAR | Status: DC | PRN
  Administered 2018-08-25 (×2): 0.5 mg via INTRAVENOUS

## 2018-08-25 MED ORDER — PHENYLEPHRINE 40 MCG/ML (10ML) SYRINGE FOR IV PUSH (FOR BLOOD PRESSURE SUPPORT)
PREFILLED_SYRINGE | INTRAVENOUS | Status: AC
  Filled 2018-08-25: qty 10

## 2018-08-25 MED ORDER — DOCUSATE SODIUM 100 MG PO CAPS: 100.0000 mg | ORAL_CAPSULE | Freq: Every day | ORAL | Status: DC

## 2018-08-25 MED ORDER — FENTANYL CITRATE (PF) 100 MCG/2ML IJ SOLN
INTRAMUSCULAR | Status: DC | PRN
  Administered 2018-08-25 (×2): 100 ug via INTRAVENOUS
  Administered 2018-08-25: 25 ug via INTRAVENOUS
  Administered 2018-08-25: 150 ug via INTRAVENOUS
  Administered 2018-08-25: 25 ug via INTRAVENOUS
  Administered 2018-08-25: 50 ug via INTRAVENOUS
  Administered 2018-08-25: 100 ug via INTRAVENOUS

## 2018-08-25 MED ORDER — MAGNESIUM SULFATE 2 GM/50ML IV SOLN: 2.0000 g | Freq: Every day | INTRAVENOUS | Status: DC | PRN

## 2018-08-25 MED ORDER — HYDROMORPHONE HCL 1 MG/ML IJ SOLN
0.5000 mg | INTRAMUSCULAR | Status: DC | PRN
  Administered 2018-08-25: 1 mg via INTRAVENOUS
  Administered 2018-08-25: 0.5 mg via INTRAVENOUS
  Administered 2018-08-26 (×2): 0.7 mg via INTRAVENOUS
  Administered 2018-08-26 (×5): 0.5 mg via INTRAVENOUS
  Administered 2018-08-27 (×3): 1 mg via INTRAVENOUS
  Administered 2018-08-27: 0.7 mg via INTRAVENOUS
  Administered 2018-08-27: 0.5 mg via INTRAVENOUS
  Administered 2018-08-28: 1 mg via INTRAVENOUS
  Filled 2018-08-25 (×16): qty 1

## 2018-08-25 MED ORDER — ALUM & MAG HYDROXIDE-SIMETH 200-200-20 MG/5ML PO SUSP: 15.0000 mL | ORAL | Status: DC | PRN

## 2018-08-25 MED ORDER — HYDROMORPHONE HCL 1 MG/ML IJ SOLN: 0.5000 mg | INTRAMUSCULAR | Status: DC | PRN

## 2018-08-25 MED ORDER — SODIUM CHLORIDE 0.9 % IV SOLN: 500.0000 mL | Freq: Once | INTRAVENOUS | Status: DC | PRN

## 2018-08-25 MED ORDER — METOPROLOL TARTRATE 5 MG/5ML IV SOLN: 2.0000 mg | INTRAVENOUS | Status: DC | PRN

## 2018-08-25 MED ORDER — ONDANSETRON HCL 4 MG/2ML IJ SOLN: 4.0000 mg | Freq: Once | INTRAMUSCULAR | Status: DC | PRN

## 2018-08-25 MED ORDER — CHLORHEXIDINE GLUCONATE 4 % EX LIQD: 60.0000 mL | Freq: Once | CUTANEOUS | Status: DC

## 2018-08-25 MED ORDER — MEPERIDINE HCL 25 MG/ML IJ SOLN: 6.2500 mg | INTRAMUSCULAR | Status: DC | PRN

## 2018-08-25 MED ORDER — FENTANYL CITRATE (PF) 250 MCG/5ML IJ SOLN
INTRAMUSCULAR | Status: AC
  Filled 2018-08-25: qty 5

## 2018-08-25 MED ORDER — ENOXAPARIN SODIUM 40 MG/0.4ML ~~LOC~~ SOLN
40.0000 mg | SUBCUTANEOUS | Status: DC
  Administered 2018-08-26 – 2018-08-29 (×4): 40 mg via SUBCUTANEOUS
  Filled 2018-08-25 (×4): qty 0.4

## 2018-08-25 MED ORDER — FLEET ENEMA 7-19 GM/118ML RE ENEM: 1.0000 | ENEMA | Freq: Once | RECTAL | Status: DC | PRN

## 2018-08-25 MED ORDER — VANCOMYCIN HCL IN DEXTROSE 1-5 GM/200ML-% IV SOLN
1000.0000 mg | INTRAVENOUS | Status: AC
  Administered 2018-08-25: 1000 mg via INTRAVENOUS
  Filled 2018-08-25: qty 200

## 2018-08-25 MED ORDER — ACETAMINOPHEN 650 MG RE SUPP: 325.0000 mg | RECTAL | Status: DC | PRN

## 2018-08-25 MED ORDER — LACTATED RINGERS IV SOLN
INTRAVENOUS | Status: DC
  Administered 2018-08-25 (×2): via INTRAVENOUS

## 2018-08-25 MED ORDER — ONDANSETRON HCL 4 MG/2ML IJ SOLN
INTRAMUSCULAR | Status: DC | PRN
  Administered 2018-08-25: 4 mg via INTRAVENOUS

## 2018-08-25 MED ORDER — LACTATED RINGERS IV SOLN
INTRAVENOUS | Status: DC | PRN
  Administered 2018-08-25 (×2): via INTRAVENOUS

## 2018-08-25 MED ORDER — ACETAMINOPHEN 325 MG RE SUPP: 325.0000 mg | RECTAL | Status: DC | PRN

## 2018-08-25 MED ORDER — DEXAMETHASONE SODIUM PHOSPHATE 10 MG/ML IJ SOLN
INTRAMUSCULAR | Status: AC
  Filled 2018-08-25: qty 1

## 2018-08-25 MED ORDER — VANCOMYCIN HCL IN DEXTROSE 1-5 GM/200ML-% IV SOLN: 1000.0000 mg | Freq: Two times a day (BID) | INTRAVENOUS | Status: DC

## 2018-08-25 MED ORDER — SODIUM CHLORIDE 0.9 % IV SOLN
INTRAVENOUS | Status: DC | PRN
  Administered 2018-08-25: 09:00:00

## 2018-08-25 MED ORDER — GUAIFENESIN-DM 100-10 MG/5ML PO SYRP: 15.0000 mL | ORAL_SOLUTION | ORAL | Status: DC | PRN

## 2018-08-25 MED ORDER — INSULIN ASPART 100 UNIT/ML ~~LOC~~ SOLN: 0.0000 [IU] | Freq: Three times a day (TID) | SUBCUTANEOUS | Status: DC

## 2018-08-25 MED ORDER — DEXAMETHASONE SODIUM PHOSPHATE 10 MG/ML IJ SOLN
INTRAMUSCULAR | Status: DC | PRN
  Administered 2018-08-25: 5 mg via INTRAVENOUS

## 2018-08-25 MED ORDER — HEPARIN SODIUM (PORCINE) 1000 UNIT/ML IJ SOLN
INTRAMUSCULAR | Status: DC | PRN
  Administered 2018-08-25: 8000 [IU] via INTRAVENOUS

## 2018-08-25 MED ORDER — SODIUM CHLORIDE 0.9 % IV SOLN
INTRAVENOUS | Status: DC | PRN
  Administered 2018-08-25: 35 ug/min via INTRAVENOUS

## 2018-08-25 MED ORDER — CEFAZOLIN SODIUM-DEXTROSE 2-4 GM/100ML-% IV SOLN
2.0000 g | Freq: Three times a day (TID) | INTRAVENOUS | Status: AC
  Administered 2018-08-25 – 2018-08-26 (×2): 2 g via INTRAVENOUS
  Filled 2018-08-25 (×2): qty 100

## 2018-08-25 MED ORDER — HYDROMORPHONE HCL 1 MG/ML IJ SOLN
INTRAMUSCULAR | Status: AC
  Administered 2018-08-25: 0.5 mg
  Filled 2018-08-25: qty 1

## 2018-08-25 MED ORDER — MIDAZOLAM HCL 5 MG/5ML IJ SOLN
INTRAMUSCULAR | Status: DC | PRN
  Administered 2018-08-25: 2 mg via INTRAVENOUS

## 2018-08-25 MED ORDER — MIDAZOLAM HCL 2 MG/2ML IJ SOLN
INTRAMUSCULAR | Status: AC
  Filled 2018-08-25: qty 2

## 2018-08-25 MED ORDER — SUGAMMADEX SODIUM 200 MG/2ML IV SOLN
INTRAVENOUS | Status: DC | PRN
  Administered 2018-08-25: 100 mg via INTRAVENOUS

## 2018-08-25 MED ORDER — ACETAMINOPHEN 10 MG/ML IV SOLN
INTRAVENOUS | Status: AC
  Filled 2018-08-25: qty 100

## 2018-08-25 MED ORDER — ACETAMINOPHEN 325 MG PO TABS
325.0000 mg | ORAL_TABLET | ORAL | Status: DC | PRN
  Administered 2018-08-28: 650 mg via ORAL
  Filled 2018-08-25 (×2): qty 2

## 2018-08-25 MED ORDER — POTASSIUM CHLORIDE CRYS ER 20 MEQ PO TBCR: 20.0000 meq | EXTENDED_RELEASE_TABLET | Freq: Every day | ORAL | Status: DC | PRN

## 2018-08-25 MED ORDER — PHENOL 1.4 % MT LIQD
1.0000 | OROMUCOSAL | Status: DC | PRN
  Administered 2018-08-26: 1 via OROMUCOSAL
  Filled 2018-08-25: qty 177

## 2018-08-25 MED ORDER — PROPOFOL 10 MG/ML IV BOLUS
INTRAVENOUS | Status: DC | PRN
  Administered 2018-08-25: 110 mg via INTRAVENOUS

## 2018-08-25 MED ORDER — ALBUMIN HUMAN 5 % IV SOLN
INTRAVENOUS | Status: DC | PRN
  Administered 2018-08-25 (×2): via INTRAVENOUS

## 2018-08-25 MED ORDER — LABETALOL HCL 5 MG/ML IV SOLN
INTRAVENOUS | Status: DC | PRN
  Administered 2018-08-25: 10 mg via INTRAVENOUS

## 2018-08-25 MED ORDER — HEPARIN SODIUM (PORCINE) 5000 UNIT/ML IJ SOLN: 5000.0000 [IU] | Freq: Three times a day (TID) | INTRAMUSCULAR | Status: DC

## 2018-08-25 MED ORDER — POTASSIUM CHLORIDE CRYS ER 20 MEQ PO TBCR
20.0000 meq | EXTENDED_RELEASE_TABLET | Freq: Every day | ORAL | Status: DC | PRN
  Filled 2018-08-25: qty 2

## 2018-08-25 MED ORDER — SODIUM CHLORIDE 0.9 % IV SOLN
INTRAVENOUS | Status: DC
  Administered 2018-08-25 – 2018-08-27 (×5): via INTRAVENOUS

## 2018-08-25 MED ORDER — ONDANSETRON HCL 4 MG/2ML IJ SOLN
INTRAMUSCULAR | Status: AC
  Filled 2018-08-25: qty 2

## 2018-08-25 MED ORDER — LABETALOL HCL 5 MG/ML IV SOLN
INTRAVENOUS | Status: AC
  Filled 2018-08-25: qty 4

## 2018-08-25 MED ORDER — PROPOFOL 10 MG/ML IV BOLUS
INTRAVENOUS | Status: AC
  Filled 2018-08-25: qty 20

## 2018-08-25 SURGICAL SUPPLY — 65 items
ATTRACTOMAT 16X20 MAGNETIC DRP (DRAPES) ×3 IMPLANT
CANISTER SUCT 3000ML PPV (MISCELLANEOUS) ×3 IMPLANT
CLIP VESOCCLUDE MED 24/CT (CLIP) ×6 IMPLANT
CLIP VESOCCLUDE SM WIDE 24/CT (CLIP) ×6 IMPLANT
COVER WAND RF STERILE (DRAPES) ×3 IMPLANT
DERMABOND ADVANCED (GAUZE/BANDAGES/DRESSINGS) ×6
DERMABOND ADVANCED .7 DNX12 (GAUZE/BANDAGES/DRESSINGS) ×3 IMPLANT
DRSG COVADERM 4X14 (GAUZE/BANDAGES/DRESSINGS) ×3 IMPLANT
DRSG COVADERM 4X6 (GAUZE/BANDAGES/DRESSINGS) ×6 IMPLANT
ELECT BLADE 4.0 EZ CLEAN MEGAD (MISCELLANEOUS) ×3
ELECT BLADE 6.5 EXT (BLADE) IMPLANT
ELECT CAUTERY BLADE 6.4 (BLADE) ×3 IMPLANT
ELECT REM PT RETURN 9FT ADLT (ELECTROSURGICAL) ×3
ELECTRODE BLDE 4.0 EZ CLN MEGD (MISCELLANEOUS) ×1 IMPLANT
ELECTRODE REM PT RTRN 9FT ADLT (ELECTROSURGICAL) ×1 IMPLANT
FELT TEFLON 1X6 (MISCELLANEOUS) ×3 IMPLANT
GLOVE BIO SURGEON STRL SZ7.5 (GLOVE) ×6 IMPLANT
GLOVE BIOGEL PI IND STRL 6.5 (GLOVE) ×4 IMPLANT
GLOVE BIOGEL PI IND STRL 7.5 (GLOVE) ×1 IMPLANT
GLOVE BIOGEL PI IND STRL 8 (GLOVE) ×1 IMPLANT
GLOVE BIOGEL PI INDICATOR 6.5 (GLOVE) ×8
GLOVE BIOGEL PI INDICATOR 7.5 (GLOVE) ×2
GLOVE BIOGEL PI INDICATOR 8 (GLOVE) ×2
GLOVE ECLIPSE 7.0 STRL STRAW (GLOVE) ×3 IMPLANT
GOWN STRL REUS W/ TWL LRG LVL3 (GOWN DISPOSABLE) ×3 IMPLANT
GOWN STRL REUS W/ TWL XL LVL3 (GOWN DISPOSABLE) ×3 IMPLANT
GOWN STRL REUS W/TWL LRG LVL3 (GOWN DISPOSABLE) ×6
GOWN STRL REUS W/TWL XL LVL3 (GOWN DISPOSABLE) ×6
GRAFT HEMASHIELD 14X7MM (Vascular Products) ×3 IMPLANT
HEMOSTAT SPONGE AVITENE ULTRA (HEMOSTASIS) IMPLANT
INSERT FOGARTY 61MM (MISCELLANEOUS) ×9 IMPLANT
INSERT FOGARTY SM (MISCELLANEOUS) ×21 IMPLANT
KIT BASIN OR (CUSTOM PROCEDURE TRAY) ×3 IMPLANT
KIT TURNOVER KIT B (KITS) ×3 IMPLANT
NS IRRIG 1000ML POUR BTL (IV SOLUTION) ×6 IMPLANT
PACK AORTA (CUSTOM PROCEDURE TRAY) ×3 IMPLANT
PAD ARMBOARD 7.5X6 YLW CONV (MISCELLANEOUS) ×6 IMPLANT
PENCIL BUTTON HOLSTER BLD 10FT (ELECTRODE) ×3 IMPLANT
RETAINER VISCERA MED (MISCELLANEOUS) ×3 IMPLANT
STAPLER VISISTAT 35W (STAPLE) ×3 IMPLANT
SURGICEL SNOW 2X4 (HEMOSTASIS) ×3 IMPLANT
SUT ETHIBOND 5 LR DA (SUTURE) IMPLANT
SUT MNCRL AB 4-0 PS2 18 (SUTURE) ×15 IMPLANT
SUT PDS AB 1 TP1 96 (SUTURE) ×6 IMPLANT
SUT PROLENE 3 0 SH1 36 (SUTURE) ×12 IMPLANT
SUT PROLENE 5 0 C 1 24 (SUTURE) ×24 IMPLANT
SUT PROLENE 5 0 C 1 36 (SUTURE) IMPLANT
SUT PROLENE 6 0 CC (SUTURE) ×24 IMPLANT
SUT SILK 2 0 (SUTURE) ×2
SUT SILK 2 0 SH CR/8 (SUTURE) ×6 IMPLANT
SUT SILK 2 0 TIES 17X18 (SUTURE) ×2
SUT SILK 2-0 18XBRD TIE 12 (SUTURE) ×1 IMPLANT
SUT SILK 2-0 18XBRD TIE BLK (SUTURE) ×1 IMPLANT
SUT SILK 3 0 (SUTURE) ×2
SUT SILK 3 0 TIES 17X18 (SUTURE) ×2
SUT SILK 3-0 18XBRD TIE 12 (SUTURE) ×1 IMPLANT
SUT SILK 3-0 18XBRD TIE BLK (SUTURE) ×1 IMPLANT
SUT VIC AB 2-0 CT1 27 (SUTURE) ×16
SUT VIC AB 2-0 CT1 TAPERPNT 27 (SUTURE) ×8 IMPLANT
SUT VIC AB 3-0 SH 27 (SUTURE) ×10
SUT VIC AB 3-0 SH 27X BRD (SUTURE) ×5 IMPLANT
TOWEL GREEN STERILE (TOWEL DISPOSABLE) ×3 IMPLANT
TOWEL SURG RFD BLUE STRL DISP (DISPOSABLE) ×6 IMPLANT
TRAY FOLEY MTR SLVR 16FR STAT (SET/KITS/TRAYS/PACK) ×6 IMPLANT
WATER STERILE IRR 1000ML POUR (IV SOLUTION) ×6 IMPLANT

## 2018-08-25 NOTE — Anesthesia Procedure Notes (Signed)
Arterial Line Insertion Start/End6/03/2018 7:15 AM Performed by: Rosalio Macadamia, CRNA, CRNA  Patient location: Pre-op. Preanesthetic checklist: patient identified, IV checked, site marked, risks and benefits discussed, surgical consent, monitors and equipment checked, pre-op evaluation, timeout performed and anesthesia consent Lidocaine 1% used for infiltration and patient sedated Right, radial was placed Catheter size: 20 G Hand hygiene performed  and maximum sterile barriers used   Attempts: 1 Procedure performed without using ultrasound guided technique. Following insertion, dressing applied and Biopatch. Post procedure assessment: normal  Patient tolerated the procedure well with no immediate complications.

## 2018-08-25 NOTE — Anesthesia Postprocedure Evaluation (Signed)
Anesthesia Post Note  Patient: Brianna Powers  Procedure(s) Performed: AORTA BIFEMORAL BYPASS USING HEMASHIELD GOLD 14 x GRAFT (Bilateral )     Patient location during evaluation: PACU Anesthesia Type: General Level of consciousness: awake and alert Pain management: pain level controlled Vital Signs Assessment: post-procedure vital signs reviewed and stable Respiratory status: spontaneous breathing, nonlabored ventilation, respiratory function stable and patient connected to nasal cannula oxygen Cardiovascular status: blood pressure returned to baseline and stable Postop Assessment: no apparent nausea or vomiting Anesthetic complications: no    Last Vitals:  Vitals:   08/25/18 1602 08/25/18 1630  BP: 121/81   Pulse:  70  Resp:  13  Temp:  36.9 C  SpO2:  100%    Last Pain:  Vitals:   08/25/18 1500  PainSc: Asleep                 Arhum Peeples DAVID

## 2018-08-25 NOTE — Transfer of Care (Signed)
Immediate Anesthesia Transfer of Care Note  Patient: Brianna Powers  Procedure(s) Performed: AORTA BIFEMORAL BYPASS USING HEMASHIELD GOLD 14 x GRAFT (Bilateral )  Patient Location: PACU  Anesthesia Type:General  Level of Consciousness: awake, alert , oriented and drowsy  Airway & Oxygen Therapy: Patient Spontanous Breathing and Patient connected to face mask oxygen  Post-op Assessment: Report given to RN and Post -op Vital signs reviewed and stable  Post vital signs: Reviewed and stable  Last Vitals:  Vitals Value Taken Time  BP 118/74 08/25/2018  1:47 PM  Temp    Pulse 73 08/25/2018  1:50 PM  Resp 16 08/25/2018  1:50 PM  SpO2 99 % 08/25/2018  1:50 PM  Vitals shown include unvalidated device data.  Last Pain:  Vitals:   08/25/18 0650  PainSc: 1       Patients Stated Pain Goal: 4 (08/25/18 0650)  Complications: No apparent anesthesia complications

## 2018-08-25 NOTE — Anesthesia Procedure Notes (Signed)
Procedure Name: Intubation Date/Time: 08/25/2018 8:09 AM Performed by: Trinna Post., CRNA Pre-anesthesia Checklist: Patient identified, Emergency Drugs available, Suction available, Patient being monitored and Timeout performed Patient Re-evaluated:Patient Re-evaluated prior to induction Oxygen Delivery Method: Circle system utilized Preoxygenation: Pre-oxygenation with 100% oxygen Induction Type: IV induction Ventilation: Mask ventilation without difficulty Laryngoscope Size: Mac and 3 Grade View: Grade I Tube type: Oral Tube size: 7.0 mm Number of attempts: 1 Placement Confirmation: ETT inserted through vocal cords under direct vision,  positive ETCO2 and breath sounds checked- equal and bilateral Secured at: 23 cm Tube secured with: Tape Dental Injury: Teeth and Oropharynx as per pre-operative assessment

## 2018-08-25 NOTE — Anesthesia Procedure Notes (Signed)
Central Venous Catheter Insertion Performed by: Arta Bruce, MD, anesthesiologist Start/End6/03/2018 7:15 AM, 08/25/2018 7:30 AM Patient location: Pre-op. Preanesthetic checklist: patient identified, IV checked, risks and benefits discussed, surgical consent, monitors and equipment checked, pre-op evaluation, timeout performed and anesthesia consent Lidocaine 1% used for infiltration and patient sedated Hand hygiene performed  and maximum sterile barriers used  Catheter size: 8.5 Fr Central line and PA cath was placed.Sheath introducer Swan type:thermodilution Procedure performed using ultrasound guided technique. Ultrasound Notes:anatomy identified, needle tip was noted to be adjacent to the nerve/plexus identified, no ultrasound evidence of intravascular and/or intraneural injection and image(s) printed for medical record Attempts: 1 Following insertion, line sutured, dressing applied and Biopatch. Post procedure assessment: blood return through all ports, free fluid flow and no air  Patient tolerated the procedure well with no immediate complications.

## 2018-08-25 NOTE — Op Note (Signed)
Date: August 25, 2018  Preoperative diagnosis: Lifestyle limiting short distance claudication of the left lower extremity with occluded left common iliac stent after redo x3  Postoperative diagnosis: Same  Procedure: Aortobifemoral bypass (14 mm x 7 mm bifurcated Dacron graft)  Surgeon: Dr. Cephus Shelling, MD  System: Dr. Gretta Began, MD and Mosetta Pigeon, [place]  Indications: Patient is a 53 year old female well-known to the vascular surgery service.  She has undergone at least 3 previous iliac endovascular interventions including 2016, 2018 and most recently by myself in 2019.  She was subsequently seen in clinic for recurrent left leg claudication after her left common iliac stent reoccluded for a second time.  Given her symptoms of short distance claudication that was lifestyle limiting and young age 87 we subsequently recommended aortobifemoral bypass.  She presents today after risk and benefits were discussed in detail with her and her husband.  Findings: Successful implantation of a 14 mm x 7 mm bifurcated Dacron graft from the infrarenal aorta to the bilateral common femoral arteries.  Patient had brisk posterior tibial signals in both lower extremities at completion of the case.  EBL: 400 mL  Urine output: 550 mL  Resuscitation: 2 L crystalloid, 500 mL albumin, 285 mL Cell Saver  Details: Patient was taken to the operating room after informed consent was obtained.  She was placed on the operating table in the supine position and general endotracheal anesthesia was induced.  Her abdominal wall as well as her bilateral groins and pubis were all prepped and draped in usual sterile fashion.  Preop timeout was performed to identify patient, procedure and site.  I initially started in the right groin while Dr. early start in the left groin and we each made oblique groin incisions and subsequently dissected out the common femoral artery after dissecting through subcutaneous tissue with  bovie cautery.  It should be noted that both of her groins had excessive scar given at least 3 previous percutaneous procedures with percutaneous closure devices.  Her arteries were also very small and required extensive dissection in order identify the profunda as well as dissect under inguinal ligament and get good control of the external iliac.  The superficial circumflex veins crossing over the distal external iliac arteries were ligated in each groin once the dissections were complete with 3-0 silk ties.  Vesseloops were placed around all branches.  We did encounter a large collateral branch in the right common femoral artery that fell apart initially during the dissection and this had to be repaired with multiple Prolene sutures.  This did not appear to be the profunda give the bifurcation was identified distally. Then turned our attention to the abdominal wall and midline incision from the xiphoid to the pubis.  Bovie cautery was used to dissect through the subcutaneous tissue in the midline fascia was opened entering the peritoneal cavity.  We explored peritoneal cavity in all quadrants looked at her gallbladder and liver there was no other abnormal pathology that was evident.  The transverse colon was then eviscerated cephalad and wrapped with a wet towel.  The small bowel was then eviscerated in the right upper quadrant and wrapped with a wet towel.  A fixed retractors was placed with a fence and splanchnic retractor.  We then open the retroperitoneum between the duodenum and the IMV to expose the infrarenal abdominal aorta.  We could see the left renal vein and bilateral renal arteries were identified.  The infrarenal aorta was then circumferentially mobilized with  Bovie cautery and blunt dissection with a finger.  Ultimately got closer to the aortic bifurcation where her previous iliac stenting procedures had been done there was significant scar and induration around the iliac arteries that made  dissection extremely difficult.  We used Metzenbaum scissors and Bovie cautery as well as blunt finger dissection in order to develop plane on top of each common iliac artery into the retroperitoneum.  Using finger dissection from the groin exposures were finally able to pass aortic clamp staying right on top of the artery in each groin and then passing umbilical tapes. At this point in time the patient was given 100 units/kg heparin and ACT was checked to maintain greater than 250.  We used a aortic clamp to clamp the infrarenal aorta distally above the IMA and then a side-biting vascular clamp was used to clamp just below the renal arteries.  The aorta was transected.  The distal stump was oversewn with a running 3-0 Prolene with excellent hemostasis.  We then selected a 14 mm x 7 mm bifurcated Dacron graft and the main body was cut short.  I then used a felt strip and used a 3-0 Prolene with parachute technique to sew a end-to-end anastomosis proximally to the infrarenal aorta.  There was excellent hemostasis and only one repair stitch had to be placed.  We then tunneled each limb of the graft through our previous exposure staying under each ureter and into each bilateral groin exposure at the common femoral arteries.  We then simultaneously spatulated the graft after getting control of each common femoral artery with a baby profunda clamp distally and a Henley under the inguinal ligament and the artery was opened in longitudinal fashion.  Once the graft spatulated appropriate length it was then sewn in end-to-side fashion to each common femoral artery with running 5-0 Prolene.  There was excellent signals in SFA and profunda distally that certainly augmented with a graft open.  We went down checked the feet and had excellent posterior tibial signals bilaterally.  Patient was given 50 mg of protamine for reversal.  Peritoneal cavity was then irrigated.  The retroperitoneum was run closed over the graft with a  3-0 Vicryl.  It should be noted that we had a lot of difficulty getting coverage of the left limb down above the left iliac artery given severe inflammation and induration from previous occluded iliac stent but finally we will get what we thought was appropriate coverage.  Peritoneal cavity was then irrigated again and the midline fascia was run closed with a #1 double-stranded PDS.  Subcutaneous tissue was closed with a running 3-0 Vicryl skin was closed 4-0 Monocryl and Dermabond was applied.  We then closed each groin with running layers of 2-0 Vicryl in several layers, 3-0 Vicryl, and 4-0 Monocryl in the skin.  Dermabond was applied.  I checked the feet again at the end of the case and there was still excellent posterior tibial signals bilaterally.  Patient was hemodynamically stable.  Complications: There was apparently an incorrect needle count per documentation on the paper sheet but a correct needle count based on how many needle packets have been opened at the end of the case.  The impression from the OR team was that we actually were not missing any needles but we did get a x-ray for completion and this showed no retained foreign body.  Condition: Stable  Cephus Shellinghristopher J. Floyd Wade, MD [facility] Office: [phone] Pager: [phone]  Canary Brimhristopher J  Carlis Abbott

## 2018-08-25 NOTE — H&P (Signed)
History and Physical Interval Note:  08/25/2018 7:25 AM  Brianna Powers  has presented today for surgery, with the diagnosis of PERIPHERAL ARTERY DISEASE.  The various methods of treatment have been discussed with the patient and family. After consideration of risks, benefits and other options for treatment, the patient has consented to  Procedure(s): AORTA BIFEMORAL BYPASS GRAFT (Bilateral) as a surgical intervention.  The patient's history has been reviewed, patient examined, no change in status, stable for surgery.  I have reviewed the patient's chart and labs.  Questions were answered to the patient's satisfaction.    Aortobifemoral bypass  Marty Heck  Patient name: Brianna Powers       MRN: [id number]        DOB: Oct 23, 1965        Sex: female  REASON FOR VISIT: Recurrent left leg claudication  HPI: Brianna Powers is a 53 y.o. female with history of diabetes and peripheral vascular disease that presents for follow-up to discuss recurrent left leg claudication.  Patient has extensive history of iliac disease and initially underwent bilateral common iliac stenting years ago in 2016 by Dr. Donnetta Hutching.  These were subsequently relined in 2018 by Dr. Donzetta Matters with 7 x 78 VBX.  I then relined her iliac stents a second time on 11/28/2017 with a 7 x 59 VBX on the right and a 7 x 79 VBX on the left.  Now having recurrent left leg claudication and left iliac stent occluded.  No new issues on follow-up today.  Had CTA abd/pelvis and echo.      Past Medical History:  Diagnosis Date  . Diabetes mellitus without complication ([place])   . Hypothyroidism   . Peripheral vascular disease [facility])          Past Surgical History:  Procedure Laterality Date  . ABDOMINAL AORTOGRAM N/A 05/02/2016   Procedure: Abdominal Aortogram;  Surgeon: Waynetta Sandy, MD;  Location: [place] [facility];  Service: Cardiovascular;  Laterality: N/A;  . ABDOMINAL AORTOGRAM W/LOWER EXTREMITY N/A 11/28/2017    Procedure: ABDOMINAL AORTOGRAM W/LOWER EXTREMITY;  Surgeon: Marty Heck, MD;  Location: [place] [facility];  Service: Cardiovascular;  Laterality: N/A;  Bil[place]tal  . FEMORAL ARTERY EXPLORATION Bilateral 01/28/2015   Procedure: BILATERAL FEMORAL ARTERY EXPLORATION;  Surgeon: Rosetta Posner, MD;  Location: [place];  Service: Vascular;  Laterality: Bilateral;  . INSERTION OF ILIAC STENT Bilateral 01/28/2015   Procedure: INSERTION OF RIGHT AND LEFT ILIAC STENT;  Surgeon: Rosetta Posner, MD;  Location: [place];  Service: Vascular;  Laterality: Bilateral;  . LOWER EXTREMITY ANGIOGRAPHY N/A 05/02/2016   Procedure: Bilateral Illac Stent;  Surgeon: Waynetta Sandy, MD;  Location: [place] [facility];  Service: Cardiovascular;  Laterality: N/A;  . PERIPHERAL VASCULAR CATHETERIZATION N/A 01/26/2015   Procedure: Abdominal Aortogram;  Surgeon: Rosetta Posner, MD;  Location: [place] [facility];  Service: Cardiovascular;  Laterality: N/A;  . PERIPHERAL VASCULAR CATHETERIZATION N/A 04/19/2016   Procedure: Abdominal Aortogram w/Lower Extremity;  Surgeon: Conrad Sellers, MD;  Location: [place] [facility];  Service: Cardiovascular;  Laterality: N/A;  . PERIPHERAL VASCULAR INTERVENTION Bilateral 11/28/2017   Procedure: PERIPHERAL VASCULAR INTERVENTION;  Surgeon: Marty Heck, MD;  Location: [place] [facility];  Service: Cardiovascular;  Laterality: Bilateral;  Iliac    Family History  Problem Relation Age of Onset  . CAD Father   . Diabetes type II Father     SOCIAL HISTORY: Social History  Tobacco Use  . Smoking status: Former Smoker    Last attempt to quit: 05/25/2014    Years since quitting: 4.0  . Smokeless tobacco: Never Used  Substance Use Topics  . Alcohol use: No    Alcohol/week: 0.0 standard drinks    Allergies  Allergen Reactions  . Amoxicillin Rash and Other (See Comments)    Panic attacks.           Current Outpatient Medications  Medication  Sig Dispense Refill  . acetaminophen (TYLENOL) 500 MG tablet Take 1,000 mg by mouth 2 (two) times daily as needed for moderate pain.    . clopidogrel (PLAVIX) 75 MG tablet Take 1 tablet (75 mg total) by mouth daily. 30 tablet 3  . diphenhydrAMINE-zinc acetate (BENADRYL) cream Apply 1 application topically 2 (two) times daily as needed for itching.    . gabapentin (NEURONTIN) 100 MG capsule Take 1 capsule (100 mg total) by mouth 3 (three) times daily. 20 capsule 0  . levothyroxine (SYNTHROID, LEVOTHROID) 75 MCG tablet Take 75 mcg by mouth daily before breakfast.    . predniSONE (STERAPRED UNI-PAK 21 TAB) 10 MG (21) TBPK tablet 40 mg by mouth daily for 7 days    . rosuvastatin (CRESTOR) 10 MG tablet Take 10 mg by mouth daily.    . traMADol (ULTRAM) 50 MG tablet Take 1 tablet (50 mg total) by mouth every 6 (six) hours as needed. (Patient taking differently: Take 50 mg by mouth every 6 (six) hours as needed for moderate pain. ) 20 tablet 0  . ACCU-CHEK AVIVA PLUS test strip TEST twice a day (Patient not taking: Reported on 06/17/2018) 50 each PRN  . ACCU-CHEK SOFTCLIX LANCETS lancets TEST twice a day (Patient not taking: Reported on 06/17/2018) 100 each PRN  . glucose monitoring kit (FREESTYLE) monitoring kit 1 each by Does not apply route as needed for other. Use as directed (Patient not taking: Reported on 06/17/2018) 1 each 0  . traMADol (ULTRAM) 50 MG tablet Take 1 tablet (50 mg total) by mouth every 6 (six) hours as needed. 25 tablet 0   No current facility-administered medications for this visit.     REVIEW OF SYSTEMS:  [X] denotes positive finding, [ ] denotes negative finding Cardiac  Comments:  Chest pain or chest pressure:    Shortness of breath upon exertion:    Short of breath when lying flat:    Irregular heart rhythm:        Vascular    Pain in calf, thigh, or hip brought on by ambulation: x Left leg  Pain in feet at night that wakes you up from your  sleep:     Blood clot in your veins:    Leg swelling:         Pulmonary    Oxygen at home:    Productive cough:     Wheezing:         Neurologic    Sudden weakness in arms or legs:     Sudden numbness in arms or legs:     Sudden onset of difficulty speaking or slurred speech:    Temporary loss of vision in one eye:     Problems with dizziness:         Gastrointestinal    Blood in stool:     Vomited blood:         Genitourinary    Burning when urinating:     Blood in urine:          Psychiatric    Major depression:         Hematologic    Bleeding problems:    Problems with blood clotting too easily:        Skin    Rashes or ulcers:        Constitutional    Fever or chills:      PHYSICAL EXAM:    Vitals:   06/17/18 1052  BP: 120/80  Pulse: 64  Resp: 14  Temp: (!) 97.5 F (36.4 C)  TempSrc: Oral  SpO2: 99%  Weight: 163 lb (73.9 kg)  Height: 5' (1.524 m)    GENERAL: The patient is a well-nourished female, in no acute distress. The vital signs are documented above. CARDIAC: There is a regular rate and rhythm.  VASCULAR:  1+ right femoral pulse palpable No left femoral pulse palpable Biphasic right PT signal Monophasic left PT signal No tissue loss, feet warm PULMONARY: There is good air exchange bilaterally without wheezing or rales. ABDOMEN: Soft and non-tender with normal pitched bowel sounds.  MUSCULOSKELETAL: There are no major deformities or cyanosis. NEUROLOGIC: No focal weakness or paresthesias are detected. SKIN: There are no ulcers or rashes noted. PSYCHIATRIC: The patient has a normal affect.  DATA:   I independently reviewed her noninvasive imaging that shows ABI of 1.02 on the right with a biphasic signal and 0.71 on the left with monophasic signal  Right distal stent has a velocity of 217 suggestive of a greater than 50% stenosis, the left distal iliac  stent appears occluded  Echo 06/13/2018: Left ventricular systolic function is normal with an EF of 55 to 60%.  CTA 06/13/17: Left iliac stent occluded - inflammation around stent likely from occlusion, right iliac stent patent, common femorals/SFA/profunda patent bilaterally   Assessment/Plan:  52 yo F with recurrent left leg claudication and now her left iliac stent appears occluded.  Discussed with Mrs. Hargraves in detail that she has had her initial iliac stents relined twice.  The last relining was 11/28/2017 with a 7 x 59 VBX on the right and a 7 x 79 VBX on the left.  It now appears she has recurrent claudication on the left with evidence of an occluded distal left iliac stent.    Review of her CTA abdomen pelvis as well as echocardiogram and think she is a candidate for open aortic surgery.  I will schedule her for an aortobifemoral bypass in early May when the coronavirus pandemic hopefully allow some non-emergent/urgent surgery to proceed.  Christopher J. Clark, MD [facility] Office: [phone] Pager: [phone]  

## 2018-08-26 ENCOUNTER — Encounter (HOSPITAL_COMMUNITY): Payer: Self-pay | Admitting: Vascular Surgery

## 2018-08-26 LAB — COMPREHENSIVE METABOLIC PANEL
ALT: 13 U/L (ref 0–44)
AST: 17 U/L (ref 15–41)
Albumin: 2.6 g/dL — ABNORMAL LOW (ref 3.5–5.0)
Alkaline Phosphatase: 73 U/L (ref 38–126)
Anion gap: 7 (ref 5–15)
BUN: 15 mg/dL (ref 6–20)
CO2: 25 mmol/L (ref 22–32)
Calcium: 7.8 mg/dL — ABNORMAL LOW (ref 8.9–10.3)
Chloride: 108 mmol/L (ref 98–111)
Creatinine, Ser: 1.02 mg/dL — ABNORMAL HIGH (ref 0.44–1.00)
GFR calc Af Amer: >60 mL/min (ref >60)
GFR calc non Af Amer: >60 mL/min (ref >60)
Glucose, Bld: 130 mg/dL — ABNORMAL HIGH (ref 70–99)
Potassium: 4.2 mmol/L (ref 3.5–5.1)
Sodium: 140 mmol/L (ref 135–145)
Total Bilirubin: 0.4 mg/dL (ref 0.3–1.2)
Total Protein: 5.2 g/dL — ABNORMAL LOW (ref 6.5–8.1)

## 2018-08-26 LAB — MAGNESIUM: Magnesium: 1.8 mg/dL (ref 1.7–2.4)

## 2018-08-26 LAB — CBC
HCT: 28.3 % — ABNORMAL LOW (ref 36.0–46.0)
Hemoglobin: 8.8 g/dL — ABNORMAL LOW (ref 12.0–15.0)
MCH: 27.5 pg (ref 26.0–34.0)
MCHC: 31.1 g/dL (ref 30.0–36.0)
MCV: 88.4 fL (ref 80.0–100.0)
Platelets: 191 10*3/uL (ref 150–400)
RBC: 3.2 MIL/uL — ABNORMAL LOW (ref 3.87–5.11)
RDW: 13.9 % (ref 11.5–15.5)
WBC: 12.3 10*3/uL — ABNORMAL HIGH (ref 4.0–10.5)
nRBC: 0 % (ref 0.0–0.2)

## 2018-08-26 LAB — AMYLASE: Amylase: 17 U/L — ABNORMAL LOW (ref 28–100)

## 2018-08-26 MED ORDER — CHLORHEXIDINE GLUCONATE CLOTH 2 % EX PADS
6.0000 | MEDICATED_PAD | Freq: Every day | CUTANEOUS | Status: DC
  Administered 2018-08-26: 18:00:00 6 via TOPICAL

## 2018-08-26 MED ORDER — PANTOPRAZOLE SODIUM 40 MG IV SOLR
40.0000 mg | INTRAVENOUS | Status: DC
  Administered 2018-08-26 – 2018-08-29 (×4): 40 mg via INTRAVENOUS
  Filled 2018-08-26 (×4): qty 40

## 2018-08-26 MED ORDER — DOCUSATE SODIUM 50 MG/5ML PO LIQD
100.0000 mg | Freq: Every day | ORAL | Status: DC
  Administered 2018-08-26 – 2018-08-30 (×2): 100 mg
  Filled 2018-08-26 (×3): qty 10

## 2018-08-26 NOTE — Plan of Care (Signed)
Patient verbalizes understanding of pain management.

## 2018-08-26 NOTE — Evaluation (Addendum)
Physical Therapy Evaluation Patient Details Name: Brianna Powers MRN: [id number] DOB: 1966-02-20 Today's Date: 08/26/2018   History of Present Illness  Brianna Powers is a 53 y.o. female with history of diabetes and peripheral vascular disease that presents for follow-up to discuss recurrent left leg claudication.  Patient has extensive history of iliac disease and initially underwent bilateral common iliac stenting years ago in 2016 by Dr. Arbie Cookey.   Pt had aortobifemoral BPG on 08/25/2018.   Clinical Impression  Pt admitted with above diagnosis. Pt currently with functional limitations due to the deficits listed below (see PT Problem List). Pt was able to stand and pivot to chair with min assist of 2.  Mod assist for bed mobility.  Limited by pain quite a bit.  Should progress and be able to go home with family assist.  Will follow acutely.   Pt will benefit from skilled PT to increase their independence and safety with mobility to allow discharge to the venue listed below.      Follow Up Recommendations Home health PT;Supervision/Assistance - 24 hour    Equipment Recommendations  Rolling walker with 5" wheels;3in1 (PT);Hospital bed    Recommendations for Other Services       Precautions / Restrictions Precautions Precautions: Fall Restrictions Weight Bearing Restrictions: No      Mobility  Bed Mobility Overal bed mobility: Needs Assistance Bed Mobility: Supine to Sit     Supine to sit: Mod assist;+2 for physical assistance     General bed mobility comments: Mod +2 due to pain needing assist for technique and almost doing helicopter method to decr pain in abdomen.   Transfers Overall transfer level: Needs assistance Equipment used: Rolling walker (2 wheeled) Transfers: Sit to/from [employer] Sit to Stand: +2 physical assistance;From elevated surface;Min assist Stand pivot transfers: +2 physical assistance;Min assist       General transfer comment:  Needed cues and min assist to stand to RW and cues to step to chair.  Pt was able to step around with cues and guard assist.   Ambulation/Gait                Stairs            Wheelchair Mobility    Modified Rankin (Stroke Patients Only)       Balance Overall balance assessment: Needs assistance Sitting-balance support: No upper extremity supported;Feet supported Sitting balance-Leahy Scale: Fair     Standing balance support: Bilateral upper extremity supported;During functional activity Standing balance-Leahy Scale: Poor Standing balance comment: Pt was able to stand with RW support needing bil UE support for balance.                              Pertinent Vitals/Pain Pain Assessment: Faces Faces Pain Scale: Hurts worst Pain Location: abdomen Pain Descriptors / Indicators: Spasm;Moaning;Grimacing;Guarding;Aching Pain Intervention(s): Limited activity within patient's tolerance;Monitored during session;Repositioned;Premedicated before session    Home Living Family/patient expects to be discharged to:: Private residence Living Arrangements: Spouse/significant other;Children Available Help at Discharge: Family;Available 24 hours/day(husband and daughter) Type of Home: House Home Access: Stairs to enter Entrance Stairs-Rails: Right Entrance Stairs-Number of Steps: 3 Home Layout: Two level;Full bath on main level Home Equipment: Shower seat;Grab bars - tub/shower Additional Comments: Pt going to stay downstairs but needs hospital bed    Prior Function Level of Independence: Independent         Comments: works in Clinical biochemist  Hand Dominance        Extremity/Trunk Assessment   Upper Extremity Assessment Upper Extremity Assessment: Defer to OT evaluation    Lower Extremity Assessment Lower Extremity Assessment: Generalized weakness    Cervical / Trunk Assessment Cervical / Trunk Assessment: Kyphotic  Communication    Communication: No difficulties  Cognition Arousal/Alertness: Awake/alert Behavior During Therapy: Anxious Overall Cognitive Status: Within Functional Limits for tasks assessed                                        General Comments      Exercises     Assessment/Plan    PT Assessment Patient needs continued PT services  PT Problem List Decreased activity tolerance;Decreased balance;Decreased mobility;Decreased knowledge of use of DME;Decreased safety awareness;Decreased knowledge of precautions;Cardiopulmonary status limiting activity;Pain       PT Treatment Interventions DME instruction;Gait training;Functional mobility training;Therapeutic activities;Therapeutic exercise;Balance training;Stair training;Patient/family education    PT Goals (Current goals can be found in the Care Plan section)  Acute Rehab PT Goals Patient Stated Goal: to go home PT Goal Formulation: With patient Time For Goal Achievement: 09/09/18 Potential to Achieve Goals: Good    Frequency Min 3X/week   Barriers to discharge        Co-evaluation PT/OT/SLP Co-Evaluation/Treatment: Yes Reason for Co-Treatment: Complexity of the patient's impairments (multi-system involvement);For patient/therapist safety PT goals addressed during session: Mobility/safety with mobility         AM-PAC PT "6 Clicks" Mobility  Outcome Measure Help needed turning from your back to your side while in a flat bed without using bedrails?: A Lot Help needed moving from lying on your back to sitting on the side of a flat bed without using bedrails?: A Lot Help needed moving to and from a bed to a chair (including a wheelchair)?: A Little Help needed standing up from a chair using your arms (e.g., wheelchair or bedside chair)?: A Little Help needed to walk in hospital room?: A Lot Help needed climbing 3-5 steps with a railing? : A Lot 6 Click Score: 14    End of Session Equipment Utilized During Treatment:  Gait belt Activity Tolerance: Patient limited by fatigue;Patient limited by pain Patient left: in chair;with call bell/phone within reach;with chair alarm set Nurse Communication: Mobility status PT Visit Diagnosis: Unsteadiness on feet (R26.81);Muscle weakness (generalized) (M62.81);Pain Pain - part of body: (abdomen)    Time: [id number] PT Time Calculation (min) (ACUTE ONLY): 20 min   Charges:   PT Evaluation $PT Eval Moderate Complexity: 1 Mod          Eon Zunker,PT Acute Rehabilitation Services Pager:  [phone]  Office:  [phone]    Berline LopesDawn F Myleigh Amara 08/26/2018, 10:28 AM

## 2018-08-26 NOTE — Evaluation (Signed)
Occupational Therapy Evaluation Patient Details Name: Brianna Powers MRN: [id number] DOB: Jul 03, 1965 Today's Date: 08/26/2018    History of Present Illness Brianna Powers is a 53 y.o. female with history of diabetes and peripheral vascular disease that presents for follow-up to discuss recurrent left leg claudication.  Patient has extensive history of iliac disease and initially underwent bilateral common iliac stenting years ago in 2016 by Dr. Arbie Cookey.   Pt had aortobifemoral BPG on 08/25/2018.    Clinical Impression   This 53 yo female admitted and underwent above presents to acute OT with increased pain and spasms in abdomen with movement, decreased mobility, decreased balance all affecting her safety and independence with basic ADLs. She will benefit from acute OT without need for follow up.    Follow Up Recommendations  No OT follow up;Supervision/Assistance - 24 hour    Equipment Recommendations  3 in 1 bedside commode;Hospital bed;Other (comment)(RW, she does not have any bedrooms downstairs and currently cannot make it up a flight of steps)       Precautions / Restrictions Precautions Precautions: Fall Restrictions Weight Bearing Restrictions: No      Mobility Bed Mobility Overal bed mobility: Needs Assistance Bed Mobility: Supine to Sit     Supine to sit: Mod assist;+2 for physical assistance     General bed mobility comments: Mod +2 due to pain needing assist for technique and almost doing helicopter method to decr pain in abdomen.   Transfers Overall transfer level: Needs assistance Equipment used: Rolling walker (2 wheeled) Transfers: Sit to/from [employer] Sit to Stand: +2 physical assistance;From elevated surface;Min assist Stand pivot transfers: +2 physical assistance;Min assist       General transfer comment: Needed cues and min assist to stand to RW and cues to step to chair.  Pt was able to step around with cues and guard assist.      Balance Overall balance assessment: Needs assistance Sitting-balance support: No upper extremity supported;Feet supported Sitting balance-Leahy Scale: Fair     Standing balance support: Bilateral upper extremity supported;During functional activity Standing balance-Leahy Scale: Poor Standing balance comment: Pt was able to stand with RW support needing bil UE support for balance.                            ADL either performed or assessed with clinical judgement   ADL Overall ADL's : Needs assistance/impaired Eating/Feeding: Independent;Sitting   Grooming: Set up;Sitting   Upper Body Bathing: Moderate assistance;Sitting   Lower Body Bathing: Total assistance Lower Body Bathing Details (indicate cue type and reason): min A +2 sit<>stand Upper Body Dressing : Maximal assistance;Sitting   Lower Body Dressing: Total assistance Lower Body Dressing Details (indicate cue type and reason): min A +2 sit<>stand Toilet Transfer: Minimal assistance;+2 for physical assistance;Stand-pivot;RW   Toileting- Clothing Manipulation and Hygiene: Moderate assistance Toileting - Clothing Manipulation Details (indicate cue type and reason): min A +2 sit<>stand        Reports her husband and dtr can A with ADLS prn     Vision Patient Visual Report: No change from baseline              Pertinent Vitals/Pain Pain Assessment: Faces Faces Pain Scale: Hurts worst Pain Location: abdomen Pain Descriptors / Indicators: Spasm;Moaning;Grimacing;Guarding;Aching Pain Intervention(s): Limited activity within patient's tolerance;Monitored during session;Premedicated before session;Repositioned     Hand Dominance  right   Extremity/Trunk Assessment Upper Extremity Assessment Upper Extremity Assessment: Generalized  weakness   Lower Extremity Assessment Lower Extremity Assessment: Generalized weakness   Cervical / Trunk Assessment Cervical / Trunk Assessment: Kyphotic   Communication  Communication Communication: No difficulties   Cognition Arousal/Alertness: Awake/alert Behavior During Therapy: Anxious Overall Cognitive Status: Within Functional Limits for tasks assessed                                                Home Living Family/patient expects to be discharged to:: Private residence Living Arrangements: Spouse/significant other;Children Available Help at Discharge: Family;Available 24 hours/day(husband and daughter) Type of Home: House Home Access: Stairs to enter [employer] CorporationEntrance Stairs-Number of Steps: 3 Entrance Stairs-Rails: Right Home Layout: Two level;Full bath on main level Alternate Level Stairs-Number of Steps: flight Alternate Level Stairs-Rails: Right Bathroom Shower/Tub: Producer, television/film/videoWalk-in shower   Bathroom Toilet: Standard     Home Equipment: Shower seat;Grab bars - tub/shower   Additional Comments: Pt going to stay downstairs but needs hospital bed      Prior Functioning/Environment Level of Independence: Independent        Comments: works in Clinical biochemistcustomer service        OT Problem List: Decreased range of motion;Decreased strength;Decreased activity tolerance;Impaired balance (sitting and/or standing);Pain      OT Treatment/Interventions: Self-care/ADL training;Balance training;DME and/or AE instruction;Patient/family education    OT Goals(Current goals can be found in the care plan section) Acute Rehab OT Goals Patient Stated Goal: to go home OT Goal Formulation: With patient Time For Goal Achievement: 09/09/18 Potential to Achieve Goals: Good  OT Frequency: Min 3X/week           Co-evaluation PT/OT/SLP Co-Evaluation/Treatment: Yes Reason for Co-Treatment: Complexity of the patient's impairments (multi-system involvement);For patient/therapist safety PT goals addressed during session: Mobility/safety with mobility;Strengthening/ROM;Balance;Proper use of DME OT goals addressed during session: Strengthening/ROM       AM-PAC OT "6 Clicks" Daily Activity     Outcome Measure Help from another person eating meals?: None Help from another person taking care of personal grooming?: A Little Help from another person toileting, which includes using toliet, bedpan, or urinal?: A Lot Help from another person bathing (including washing, rinsing, drying)?: A Lot Help from another person to put on and taking off regular upper body clothing?: A Lot Help from another person to put on and taking off regular lower body clothing?: Total 6 Click Score: 14   End of Session Equipment Utilized During Treatment: Rolling walker Nurse Communication: Mobility status  Activity Tolerance: Patient limited by pain Patient left: in chair;with chair alarm set;with call bell/phone within reach  OT Visit Diagnosis: Unsteadiness on feet (R26.81);Muscle weakness (generalized) (M62.81);Pain Pain - part of body: (abdomen)                Time: [id number] OT Time Calculation (min): 24 min Charges:  OT General Charges $OT Visit: 1 Visit OT Evaluation $OT Eval Moderate Complexity: 1 Mod  Ignacia Palmaathy Moria Brophy, OTR/L [facility] Pager [phone] Office [phone]     Evette GeorgesLeonard, Soley Harriss Eva 08/26/2018, 11:02 AM

## 2018-08-26 NOTE — Progress Notes (Addendum)
AAA Progress Note    08/26/2018 7:32 AM 1 Day Post-Op  Subjective:  C/o her stomach hurting; feels like she is having muscle spasms  Afebrile 100's-120's systolic (a-line is not working) HR 70's-90's NSR 100% 2LO2NC  Vitals:   08/26/18 0500 08/26/18 0600  BP: 109/60 109/61  Pulse: 89 87  Resp: 14 15  Temp:    SpO2: 100% 100%    Physical Exam: Cardiac:  regular Lungs:  Non labored Abdomen:  Soft, NT, -BS; -flatus Incisions:  Midline and bilateral groin incisions with bandages in place that are clean and dry. Extremities:  Brisk doppler signals bilateral DP/PT; motor and sensation are in tact.   CBC    Component Value Date/Time   WBC 12.3 (H) 08/26/2018 0354   RBC 3.20 (L) 08/26/2018 0354   HGB 8.8 (L) 08/26/2018 0354   HCT 28.3 (L) 08/26/2018 0354   PLT 191 08/26/2018 0354   MCV 88.4 08/26/2018 0354   MCH 27.5 08/26/2018 0354   MCHC 31.1 08/26/2018 0354   RDW 13.9 08/26/2018 0354    BMET    Component Value Date/Time   NA 140 08/26/2018 0354   K 4.2 08/26/2018 0354   CL 108 08/26/2018 0354   CO2 25 08/26/2018 0354   GLUCOSE 130 (H) 08/26/2018 0354   BUN 15 08/26/2018 0354   CREATININE 1.02 (H) 08/26/2018 0354   CALCIUM 7.8 (L) 08/26/2018 0354   GFRNONAA >60 08/26/2018 0354   GFRAA >60 08/26/2018 0354    INR    Component Value Date/Time   INR 1.3 (H) 08/25/2018 1413     Intake/Output Summary (Last 24 hours) at 08/26/2018 0732 Last data filed at 08/26/2018 0600 Gross per 24 hour  Intake 4462.67 ml  Output 1935 ml  Net 2527.67 ml     Assessment/Plan:  53 y.o. female is s/p  Aortobifemoral bypass (14 mm x 7 mm bifurcated Dacron graft) 1 Day Post-Op  Cardiac:  HR is regular; hemodynamically stable.  Will d/c a-line as it is not working.  Not requiring any gtts/pressors Pulmonary:  Extubated.  100% sats on 2LO2;  Encourage incentive spirometer Neuro:  In tact Vascular:  Brisk doppler signals bilateral feet; motor/sensation in tact GI:  150cc out  from NGT - most likely will remain until tomorrow.  -flatus or BS-continue npo.   Continue IVF.   Heme/ID:  Afebrile; acute blood loss anemia-she is tolerating.  Leukocytosis improved from yesterday-most likely related to peri-op period.   Renal:  Creatinine normal this am with good uop. May be able to get foley out today General:  No distress; has pain with movement.  Discussed using pillow to help with pressure on abdomen when coughing/deep breathing etc.  Needs to get oob to chair.   Possible transfer to progressive unit-will d/w Dr. Chestine Spore.   DVT prophylaxis:  Lovenox to start this am    Doreatha Massed, [place] [facility] [phone] 08/26/2018 7:32 AM  I have seen and evaluated the patient. I agree with the PA note as documented above. POD#1 s/p aortobifem bypass.  Doing well in ICU.  N: No deficits, GCS 15 CV: HDS, Hgb 10 --> 8.8, no hypotension, etc.  Likely dilutional.  D/C a line.  Can d/c cordis as well if IV access ok. Pulm: Pulmonary toilet, [place] 2 L GI: NPO, NG ILWS, no gas yet, abdomen soft, appropriate tenderness Renal: Adequate UOP ID: No infectious concerns.   Lovenox DVT prophlyaxis OOB to chair, PT/OT If stable today can transfer  to floor later today  Palpable PT pulses bilaterally.  Will remove surgical dressings tomorrow.   Cephus Shellinghristopher J. Diyana Starrett, MD [facility] [place] Office: [phone] Pager: [phone]

## 2018-08-27 ENCOUNTER — Inpatient Hospital Stay (HOSPITAL_COMMUNITY): Payer: BC Managed Care – PPO

## 2018-08-27 LAB — CBC
HCT: 23.7 % — ABNORMAL LOW (ref 36.0–46.0)
Hemoglobin: 7.3 g/dL — ABNORMAL LOW (ref 12.0–15.0)
MCH: 27.9 pg (ref 26.0–34.0)
MCHC: 30.8 g/dL (ref 30.0–36.0)
MCV: 90.5 fL (ref 80.0–100.0)
Platelets: 164 10*3/uL (ref 150–400)
RBC: 2.62 MIL/uL — ABNORMAL LOW (ref 3.87–5.11)
RDW: 14.3 % (ref 11.5–15.5)
WBC: 17.1 10*3/uL — ABNORMAL HIGH (ref 4.0–10.5)
nRBC: 0 % (ref 0.0–0.2)

## 2018-08-27 LAB — BASIC METABOLIC PANEL
Anion gap: 8 (ref 5–15)
BUN: 9 mg/dL (ref 6–20)
CO2: 23 mmol/L (ref 22–32)
Calcium: 7.9 mg/dL — ABNORMAL LOW (ref 8.9–10.3)
Chloride: 109 mmol/L (ref 98–111)
Creatinine, Ser: 0.86 mg/dL (ref 0.44–1.00)
GFR calc Af Amer: >60 mL/min (ref >60)
GFR calc non Af Amer: >60 mL/min (ref >60)
Glucose, Bld: 126 mg/dL — ABNORMAL HIGH (ref 70–99)
Potassium: 3.5 mmol/L (ref 3.5–5.1)
Sodium: 140 mmol/L (ref 135–145)

## 2018-08-27 LAB — MAGNESIUM: Magnesium: 1.9 mg/dL (ref 1.7–2.4)

## 2018-08-27 MED ORDER — KCL IN DEXTROSE-NACL 20-5-0.45 MEQ/L-%-% IV SOLN
INTRAVENOUS | Status: DC
  Administered 2018-08-27 – 2018-08-29 (×3): via INTRAVENOUS
  Filled 2018-08-27 (×4): qty 1000

## 2018-08-27 MED ORDER — ORAL CARE MOUTH RINSE
15.0000 mL | Freq: Two times a day (BID) | OROMUCOSAL | Status: DC
  Administered 2018-08-29: 12:00:00 15 mL via OROMUCOSAL

## 2018-08-27 MED ORDER — POTASSIUM CHLORIDE 10 MEQ/100ML IV SOLN
10.0000 meq | INTRAVENOUS | Status: AC
  Administered 2018-08-27 (×2): 10 meq via INTRAVENOUS
  Filled 2018-08-27: qty 100

## 2018-08-27 MED ORDER — CHLORHEXIDINE GLUCONATE 0.12 % MT SOLN
15.0000 mL | Freq: Two times a day (BID) | OROMUCOSAL | Status: DC
  Administered 2018-08-27 – 2018-08-30 (×5): 15 mL via OROMUCOSAL
  Filled 2018-08-27 (×5): qty 15

## 2018-08-27 NOTE — Progress Notes (Addendum)
AAA Progress Note    08/27/2018 7:55 AM 2 Days Post-Op  Subjective:  Want's the tube out of her nose.   Tm 100.7 now afebrile HR 90's-110's NSR 110's-120's systolic 95% RA  Vitals:   08/27/18 0600 08/27/18 0700  BP: 123/82 121/79  Pulse: (!) 106 100  Resp: (!) 25 (!) 21  Temp:    SpO2: 93% 94%    Physical Exam: Cardiac:  Regular/tachy Lungs:  Non labored; able to use IS to 750 Abdomen:  Soft, NT/ND; minimal bowel sounds Incisions:  Mid line incision is clean and dry  Extremities:  Motor and sensation are in tact; Brisk PT signals bilaterally; monophasic DP bilaterally  CBC    Component Value Date/Time   WBC 17.1 (H) 08/27/2018 0300   RBC 2.62 (L) 08/27/2018 0300   HGB 7.3 (L) 08/27/2018 0300   HCT 23.7 (L) 08/27/2018 0300   PLT 164 08/27/2018 0300   MCV 90.5 08/27/2018 0300   MCH 27.9 08/27/2018 0300   MCHC 30.8 08/27/2018 0300   RDW 14.3 08/27/2018 0300    BMET    Component Value Date/Time   NA 140 08/27/2018 0300   K 3.5 08/27/2018 0300   CL 109 08/27/2018 0300   CO2 23 08/27/2018 0300   GLUCOSE 126 (H) 08/27/2018 0300   BUN 9 08/27/2018 0300   CREATININE 0.86 08/27/2018 0300   CALCIUM 7.9 (L) 08/27/2018 0300   GFRNONAA >60 08/27/2018 0300   GFRAA >60 08/27/2018 0300    INR    Component Value Date/Time   INR 1.3 (H) 08/25/2018 1413     Intake/Output Summary (Last 24 hours) at 08/27/2018 0755 Last data filed at 08/27/2018 0600 Gross per 24 hour  Intake 2824.47 ml  Output 1395 ml  Net 1429.47 ml    NGT output:  250cc/24hr   Assessment/Plan:  53 y.o. female is s/p  Aortobifemoral bypass grafting 2 Days Post-Op  Cardiac:  HR regular/mildly tachy; hemodynamically stable Pulmonary:  Extubated with 99% RA able to get to 750 on IS - continue to encourage.  Neuro:  In tact; moving all extremities Vascular:  Brisk PT signals bilaterally; monophasic DP bilaterally; dc cordis GI:  250cc/24hrs out of NGT. Will remove NGT today; minimal  BS-continue npo and IVF.  Non tender to palpation and non distended.  No nausea/vomiting; minimal flatus Heme/ID:  Acute surgical blood loss anemia-hgb dropped to 7.3 today.  Pt stable and doubt any bleeding and most likely dilutional.  Will continue to monitor.   Leukocytosis with mild fever-most likely atelectasis.  Will check CXR this morning.  Renal:  Creatinine normal with good uop; dc foley tomorrow General:  Up in chair-no distress; PT recommending HHPT; continue to encourage mobilization. DVT prophylaxis:  Lovenox  Transfer to the tele floor later this morning   Doreatha Massed, PA-C [facility] [phone] 08/27/2018 7:55 AM  I have seen and evaluated the patient. I agree with the PA note as documented above. Doing well POD#2 s/p aortobifem.    N: No deficits, GCS 15 CV: HDS, Hgb 10 --> 8.8 ---> 7.3, no hypotension, etc.  Likely dilutional and acute blood loss anemia from surgery.  D/C a line yesterday.  Can d/c cordis today. Pulm: Pulmonary toilet, not great on IS, only 500-750 GI: NPO, NG ILWS, appropriate abdominal tenderness, NG output 200 will d/c today, she thinks small amount flatus Renal: Adequate UOP, keep foley one more day, Cr 0.86 ID: No infectious concerns.  WBC did rise from 12 -->  17 will monitor, suspect pulmonary toilet.  Incisions look great. Lovenox DVT prophlyaxis Fluids: Will decrease to 75 mL/hr, D5 1/2 NS with k for maintenance OOB to chair, PT/OT Transfer to floor today\ Brisk PT signals bilaterally, feet motor and sensory intact    Cephus Shellinghristopher J. Charita Lindenberger, MD [facility] [place] Office: [phone] Pager: [phone]

## 2018-08-27 NOTE — TOC Initial Note (Signed)
Transition of Care [facility]) - Initial/Assessment Note    Patient Details  Name: Brianna Powers MRN: [id number] Date of Birth: 1965-11-27  Transition of [facility] CM/SW Contact:    Leone Haven, RN Phone Number: 08/27/2018, 4:22 PM  Clinical Narrative:                 From home with spouse and elderly Mom.  S/p aortobifemoral bypass. NCM offered choice, she chose [facility] , referral made to [facility] for HHPT, she states they can take referral.  Soc will begin 24-48 hrs post dc. Patient states she needs rolling walker, 3 n 1, and hospital bed.  Referral made to [facility] with [employer].  Orders are in for walker and 3 n 1.  Will need order for hospital bed and narrative.  Expected Discharge Plan: Home w Home Health Services Barriers to Discharge: No Barriers Identified   Patient Goals and CMS Choice Patient states their goals for this hospitalization and ongoing recovery are:: get back to her self CMS Medicare.gov Compare Post Acute Care list provided to:: Patient Choice offered to / list presented to : Patient  Expected Discharge Plan and Services Expected Discharge Plan: Home w Home Health Services In-house Referral: NA Discharge Planning Services: CM Consult Post Acute Care Choice: Home Health Living arrangements for the past 2 months: Single Family Home                 DME Arranged: 3-N-1, Walker rolling, Hospital bed DME Agency: [employer] Date DME Agency Contacted: 08/27/18 Time DME Agency Contacted: [phone] Representative spoke with at DME Agency: zack HH Arranged: PT HH Agency: [facility] Date HH Agency Contacted: 08/27/18 Time HH Agency Contacted: 1621 Representative spoke with at [facility] Agency: Lupita Leash  Prior Living Arrangements/Services Living arrangements for the past 2 months: Single Family Home Lives with:: Spouse Patient language and need for interpreter reviewed:: Yes Do you feel safe going back to the place where you live?: Yes      Need for Family  Participation in Patient Care: No (Comment) Care giver support system in place?: Yes (comment)   Criminal Activity/Legal Involvement Pertinent to Current Situation/Hospitalization: No - Comment as needed  Activities of Daily Living Home Assistive Devices/Equipment: None ADL Screening (condition at time of admission) Patient's cognitive ability adequate to safely complete daily activities?: Yes Is the patient deaf or have difficulty hearing?: No Does the patient have difficulty seeing, even when wearing glasses/contacts?: No Does the patient have difficulty concentrating, remembering, or making decisions?: No Patient able to express need for assistance with ADLs?: Yes Does the patient have difficulty dressing or bathing?: No Independently performs ADLs?: Yes (appropriate for developmental age) Does the patient have difficulty walking or climbing stairs?: Yes Weakness of Legs: Both Weakness of Arms/Hands: Both  Permission Sought/Granted                  Emotional Assessment Appearance:: Appears stated age Attitude/Demeanor/Rapport: (Appropriate) Affect (typically observed): Accepting Orientation: : Oriented to Self, Oriented to Place, Oriented to  Time, Oriented to Situation Alcohol / Substance Use: Not Applicable Psych Involvement: No (comment)  Admission diagnosis:  PERIPHERAL ARTERY DISEASE Patient Active Problem List   Diagnosis Date Noted  . Chronic aorto-iliac occlusion syndrome (HCC) 08/25/2018  . Aortoiliac occlusive disease (HCC) 08/25/2018  . Atherosclerosis of native arteries of extremity with intermittent claudication (HCC) 05/27/2018  . Aortic thrombus (HCC) 01/26/2015   PCP:  Benita Stabile, MD Pharmacy:   [facility] [phone] - [place],  [place] - [address] [facility] [address] [place] [id number] Phone: [phone] Fax: [phone]     Social Determinants of Health (SDOH) Interventions    Readmission Risk Interventions No  flowsheet data found.

## 2018-08-27 NOTE — Progress Notes (Signed)
Physical Therapy Treatment Patient Details Name: Brianna Powers MRN: [id number] DOB: [birth year] Today's Date: 08/27/2018    History of Present Illness Brianna Powers is a 53 y.o. female with history of diabetes and peripheral vascular disease that presents for follow-up to discuss recurrent left leg claudication.  Patient has extensive history of iliac disease and initially underwent bilateral common iliac stenting years ago in 2016 by Dr. Arbie CookeyEarly.   Pt had aortobifemoral BPG on 08/25/2018.     PT Comments    Pt admitted with above diagnosis. Pt currently with functional limitations due to balance and endurance deficits. Pt was able to ambulate with RW with min assist and cues as pt in pain and needed assist to move RW as well as cues to stand tall. Progressing.   Pt will benefit from skilled PT to increase their independence and safety with mobility to allow discharge to the venue listed below.     Follow Up Recommendations  Home health PT;Supervision/Assistance - 24 hour     Equipment Recommendations  Rolling walker with 5" wheels;3in1 (PT);Hospital bed    Recommendations for Other Services       Precautions / Restrictions Precautions Precautions: Fall Restrictions Weight Bearing Restrictions: No    Mobility  Bed Mobility Overal bed mobility: Needs Assistance Bed Mobility: Supine to Sit;Sit to Sidelying     Supine to sit: Min assist;+2 for physical assistance   Sit to sidelying: Min assist General bed mobility comments: Min +2 due to pain needing assist for technique   Transfers Overall transfer level: Needs assistance Equipment used: Rolling walker (2 wheeled) Transfers: Sit to/from [employer] CorporationStand;Stand Pivot Transfers Sit to Stand: +2 physical assistance;From elevated surface;Min assist         General transfer comment: Needed cues and min assist to stand to RW and cues to step to chair.    Ambulation/Gait Ambulation/Gait assistance: Min assist;+2  safety/equipment Gait Distance (Feet): 175 Feet Assistive device: Rolling walker (2 wheeled) Gait Pattern/deviations: Step-through pattern;Decreased stride length;Drifts right/left   Gait velocity interpretation: <1.31 ft/sec, indicative of household ambulator General Gait Details: Pt was able to ambulate with RW with min assist and cues.  Needed assist to steerRW and cues to stand tall.  Pt limited by pain but did try and push herself to walk further. Did not want to sit up in chair as she did earlier. Assisted pt back to bed.     Stairs             Wheelchair Mobility    Modified Rankin (Stroke Patients Only)       Balance Overall balance assessment: Needs assistance Sitting-balance support: No upper extremity supported;Feet supported Sitting balance-Leahy Scale: Fair     Standing balance support: Bilateral upper extremity supported;During functional activity Standing balance-Leahy Scale: Poor Standing balance comment: Pt was able to stand with RW support needing bil UE support for balance.                             Cognition Arousal/Alertness: Awake/alert Behavior During Therapy: Anxious Overall Cognitive Status: Within Functional Limits for tasks assessed                                        Exercises      General Comments        Pertinent Vitals/Pain Pain Assessment: Faces Faces Pain  Scale: Hurts worst Pain Location: abdomen Pain Descriptors / Indicators: Spasm;Moaning;Grimacing;Guarding;Aching Pain Intervention(s): Limited activity within patient's tolerance;Monitored during session;Premedicated before session;Repositioned    Home Living                      Prior Function            PT Goals (current goals can now be found in the care plan section) Acute Rehab PT Goals Patient Stated Goal: to go home Progress towards PT goals: Progressing toward goals    Frequency    Min 3X/week      PT Plan  Current plan remains appropriate    Co-evaluation              AM-PAC PT "6 Clicks" Mobility   Outcome Measure  Help needed turning from your back to your side while in a flat bed without using bedrails?: A Lot Help needed moving from lying on your back to sitting on the side of a flat bed without using bedrails?: A Lot Help needed moving to and from a bed to a chair (including a wheelchair)?: A Little Help needed standing up from a chair using your arms (e.g., wheelchair or bedside chair)?: A Little Help needed to walk in hospital room?: A Little Help needed climbing 3-5 steps with a railing? : A Lot 6 Click Score: 15    End of Session Equipment Utilized During Treatment: Gait belt Activity Tolerance: Patient limited by fatigue;Patient limited by pain Patient left: with call bell/phone within reach;in bed Nurse Communication: Mobility status PT Visit Diagnosis: Unsteadiness on feet (R26.81);Muscle weakness (generalized) (M62.81);Pain Pain - part of body: (abdomen)     Time: 6222-9798 PT Time Calculation (min) (ACUTE ONLY): 18 min  Charges:  $Gait Training: 8-22 mins                     Sue Mcalexander,PT Acute Rehabilitation Services Pager:  [phone]  Office:  [phone]     [name] 08/27/2018, 10:12 AM

## 2018-08-27 NOTE — Discharge Instructions (Signed)
 [facility] [place]  Discharge Instructions   Open Aortic Surgery  Please refer to the following instructions for your post-procedure care. Your surgeon or Physician Assistant will discuss any changes with you.  Activity  Avoid lifting more than eight pounds (a gallon of milk) until after your first post-operative visit. You are encouraged to walk as much as you can. You can slowly return to normal activities but must avoid strenuous activity and heavy lifting until your doctor tells you it's okay. Heavy lifting can hurt the incision and cause a hernia. Avoid activities such as vacuuming or swinging a golf club. It is normal to feel tired for several weeks after your surgery. Do not drive until your doctor gives the okay and you are no longer taking prescription pain medications. It is also normal to have difficulty with sleep habits, eating and bowl movements after surgery. These will go away with time.  Bathing/Showering  Shower daily after you go home. Do not soak in a bathtub, hot tub, or swim until the incision heals.  Incision Care  Shower every day. Clean your incision with mild soap and water. Pat the area dry with a clean towel. You do not need a bandage unless otherwise instructed. Do not apply any ointments or creams to your incision. You may have skin glue on your incision. Do not peel it off. It will come off on its own in about one week. If you have staples or sutures along your incision, they will be removed at your post op appointment.  If you have groin incisions, wash the groin wounds with soap and water daily and pat dry. (No tub bath-only shower)  Then put a dry gauze or washcloth in the groin to keep this area dry to help prevent wound infection.  Do this daily and as needed.  Do not use Vaseline or neosporin on your incisions.  Only use soap and water on your incisions and then protect and keep dry.  Diet  Resume your normal diet. There are no  special food restriction following this procedure. A low fat/low cholesterol diet is recommended for all patients with vascular disease. After your aortic surgery, it's normal to feel full faster than usual and to not feel as hungry as you normally would. You will probably lose weight initially following your surgery. It's best to eat small, frequent meals over the course of the day. Call the office if you find that you are unable to eat even small meals.   In order to heal from your surgery, it is CRITICAL to get adequate nutrition. Your body requires vitamins, minerals, and protein. Vegetables are the best source of vitamins and minerals. If you have pain, you may take over-the-counter pain reliever such as acetaminophen (Tylenol). If you were prescribed a stronger pain medication, please be aware these medication can cause nausea and constipation. Prevent nausea by taking the medication with a snack or meal. Avoid constipation by drinking plenty of fluids and eating foods with a high amount of fiber, such as fruits, vegetables and grains. Take 100mg of the over-the-counter stool softener Colace twice a day as needed to help with constipation. A laxative, such as Milk of Magnesia, may be recommended for you at this time. Do not take a laxative unless your surgeon or P.A. tells you it's OK.  Do not take Tylenol if you are taking stronger pain medications (such as Percocet).  Follow Up  Our office will schedule a follow up   appointment 2-3 weeks after discharge.  Please call us immediately for any of the following conditions    .     Severe or worsening pain in your legs or feet or in your abdomen back or chest. Increased pain, redness drainage (pus) from your incision site. Increased abdominal pain, bloating, nausea, vomiting, or persistent diarrhea. Fever of 101 degrees or higher. Swelling in your leg (s).  Reduce your risk of vascular disease  Stop smoking. If you would like help, call  [facility] at 1-800-QUIT-NOW (1-800-784-8669) or [place] at 336-586-4000. Manage your cholesterol Maintain a desired weight Control your diabetes Keep your blood pressure down  If you have any questions please call the office at [phone].   

## 2018-08-28 LAB — BASIC METABOLIC PANEL
Anion gap: 7 (ref 5–15)
BUN: 5 mg/dL — ABNORMAL LOW (ref 6–20)
CO2: 21 mmol/L — ABNORMAL LOW (ref 22–32)
Calcium: 8 mg/dL — ABNORMAL LOW (ref 8.9–10.3)
Chloride: 106 mmol/L (ref 98–111)
Creatinine, Ser: 0.86 mg/dL (ref 0.44–1.00)
GFR calc Af Amer: >60 mL/min (ref >60)
GFR calc non Af Amer: >60 mL/min (ref >60)
Glucose, Bld: 155 mg/dL — ABNORMAL HIGH (ref 70–99)
Potassium: 3.7 mmol/L (ref 3.5–5.1)
Sodium: 134 mmol/L — ABNORMAL LOW (ref 135–145)

## 2018-08-28 LAB — CBC
HCT: 22.2 % — ABNORMAL LOW (ref 36.0–46.0)
Hemoglobin: 7 g/dL — ABNORMAL LOW (ref 12.0–15.0)
MCH: 27.5 pg (ref 26.0–34.0)
MCHC: 31.5 g/dL (ref 30.0–36.0)
MCV: 87.1 fL (ref 80.0–100.0)
Platelets: 187 10*3/uL (ref 150–400)
RBC: 2.55 MIL/uL — ABNORMAL LOW (ref 3.87–5.11)
RDW: 14.1 % (ref 11.5–15.5)
WBC: 16.4 10*3/uL — ABNORMAL HIGH (ref 4.0–10.5)
nRBC: 0 % (ref 0.0–0.2)

## 2018-08-28 MED ORDER — LEVOTHYROXINE SODIUM 75 MCG PO TABS
75.0000 ug | ORAL_TABLET | Freq: Every day | ORAL | Status: DC
  Administered 2018-08-29 – 2018-08-30 (×2): 75 ug via ORAL
  Filled 2018-08-28 (×2): qty 1

## 2018-08-28 MED ORDER — BISACODYL 10 MG RE SUPP
10.0000 mg | Freq: Once | RECTAL | Status: AC
  Administered 2018-08-28: 10 mg via RECTAL
  Filled 2018-08-28: qty 1

## 2018-08-28 MED ORDER — ROSUVASTATIN CALCIUM 5 MG PO TABS
10.0000 mg | ORAL_TABLET | Freq: Every day | ORAL | Status: DC
  Administered 2018-08-29: 10 mg via ORAL
  Filled 2018-08-28: qty 2

## 2018-08-28 NOTE — Progress Notes (Signed)
Occupational Therapy Treatment Patient Details Name: Brianna Powers MRN: [id number] DOB: 13-Nov-1965 Today's Date: 08/28/2018    History of present illness Brianna Powers is a 53 y.o. female with history of diabetes and peripheral vascular disease that presents for follow-up to discuss recurrent left leg claudication.  Patient has extensive history of iliac disease and initially underwent bilateral common iliac stenting years ago in 2016 by Dr. Arbie Cookey.   Pt had aortobifemoral BPG on 08/25/2018.    OT comments  Pt continues to be limited by pain. Requiring max assist to raise trunk sidelying>sit and min assist to transfer to [facility] and to chair. Performed seated grooming with set up.   Follow Up Recommendations  No OT follow up;Supervision/Assistance - 24 hour    Equipment Recommendations  3 in 1 bedside commode;Hospital bed;Other (comment)(RW)    Recommendations for Other Services      Precautions / Restrictions Precautions Precautions: Fall       Mobility Bed Mobility   Bed Mobility: Rolling;Sidelying to Sit Rolling: Mod assist Sidelying to sit: Max assist       General bed mobility comments: instructed in log roll technique to minimize pain, HOB up, reliant on rail  Transfers Overall transfer level: Needs assistance Equipment used: 1 person hand held assist;Rolling walker (2 wheeled) Transfers: Sit to/from [employer] Sit to Stand: Min assist Stand pivot transfers: Min assist       General transfer comment: assist to rise and steady, slow moving    Balance Overall balance assessment: Needs assistance   Sitting balance-Leahy Scale: Fair     Standing balance support: Single extremity supported Standing balance-Leahy Scale: Poor Standing balance comment: was able to perform pericare in standing with one hand on walker                           ADL either performed or assessed with clinical judgement   ADL Overall ADL's : Needs  assistance/impaired     Grooming: Wash/dry hands;Wash/dry face;Sitting;Set up                   Toilet Transfer: Minimal assistance;Stand-pivot   Toileting- Clothing Manipulation and Hygiene: Minimal assistance;Sit to/from stand       Functional mobility during ADLs: Minimal assistance;Rolling walker       Vision       Perception     Praxis      Cognition Arousal/Alertness: Awake/alert Behavior During Therapy: Flat affect;Anxious Overall Cognitive Status: Within Functional Limits for tasks assessed                                          Exercises     Shoulder Instructions       General Comments      Pertinent Vitals/ Pain       Pain Assessment: Faces Faces Pain Scale: Hurts even more Pain Location: abdomen Pain Descriptors / Indicators: Grimacing;Guarding;Moaning Pain Intervention(s): Monitored during session;Repositioned;Premedicated before session  Home Living                                          Prior Functioning/Environment              Frequency  Min 3X/week  Progress Toward Goals  OT Goals(current goals can now be found in the care plan section)     Acute Rehab OT Goals Patient Stated Goal: to go home OT Goal Formulation: With patient Time For Goal Achievement: 09/09/18 Potential to Achieve Goals: Good  Plan Discharge plan remains appropriate    Co-evaluation                 AM-PAC OT "6 Clicks" Daily Activity     Outcome Measure   Help from another person eating meals?: None Help from another person taking care of personal grooming?: A Little Help from another person toileting, which includes using toliet, bedpan, or urinal?: A Little Help from another person bathing (including washing, rinsing, drying)?: A Lot Help from another person to put on and taking off regular upper body clothing?: A Little Help from another person to put on and taking off regular lower body  clothing?: A Lot 6 Click Score: 17    End of Session Equipment Utilized During Treatment: Rolling walker  OT Visit Diagnosis: Unsteadiness on feet (R26.81);Muscle weakness (generalized) (M62.81);Pain   Activity Tolerance Patient limited by pain   Patient Left in chair;with call bell/phone within reach   Nurse Communication Mobility status        Time: 4132-44010836-0901 OT Time Calculation (min): 25 min  Charges: OT General Charges $OT Visit: 1 Visit OT Treatments $Self Care/Home Management : 23-37 mins  Martie RoundJulie Julianny Milstein, OTR/L Acute Rehabilitation Services Pager: [phone] Office: [phone]   Evern BioMayberry, Tula Schryver Lynn 08/28/2018, 9:51 AM

## 2018-08-28 NOTE — Progress Notes (Addendum)
  AAA Progress Note    08/28/2018 7:24 AM 3 Days Post-Op  Subjective:  Says she's having some midline pain.  Tm 99.9 now 99.6 HR 100's-110's NSR/ST 110's systolic 94% RA  Vitals:   08/27/18 2022 08/28/18 0430  BP: 117/71 114/72  Pulse: (!) 112 (!) 103  Resp: (!) 28 (!) 25  Temp: 99.9 F (37.7 C) 99.6 F (37.6 C)  SpO2: 92% 92%    Physical Exam: Cardiac:  regular Lungs:  Non labored Abdomen:  Soft, NT/ND; +flatus; -BM Incisions:  Incisions continue to heal nicely Extremities:  +palpable PT pulses bilaterally  CBC    Component Value Date/Time   WBC 16.4 (H) 08/28/2018 0241   RBC 2.55 (L) 08/28/2018 0241   HGB 7.0 (L) 08/28/2018 0241   HCT 22.2 (L) 08/28/2018 0241   PLT 187 08/28/2018 0241   MCV 87.1 08/28/2018 0241   MCH 27.5 08/28/2018 0241   MCHC 31.5 08/28/2018 0241   RDW 14.1 08/28/2018 0241    BMET    Component Value Date/Time   NA 134 (L) 08/28/2018 0241   K 3.7 08/28/2018 0241   CL 106 08/28/2018 0241   CO2 21 (L) 08/28/2018 0241   GLUCOSE 155 (H) 08/28/2018 0241   BUN 5 (L) 08/28/2018 0241   CREATININE 0.86 08/28/2018 0241   CALCIUM 8.0 (L) 08/28/2018 0241   GFRNONAA >60 08/28/2018 0241   GFRAA >60 08/28/2018 0241    INR    Component Value Date/Time   INR 1.3 (H) 08/25/2018 1413     Intake/Output Summary (Last 24 hours) at 08/28/2018 0724 Last data filed at 08/28/2018 1610 Gross per 24 hour  Intake 1288.83 ml  Output 1200 ml  Net 88.83 ml     Assessment/Plan:  53 y.o. female is s/p  Aortobifemoral bypass grafting 3 Days Post-Op  Vascular:  pt with palpable PT pulses bilaterally  Cardiac:  Regular/tachy; hemodynamically stable.   Pulmonary:  Non labored and on RA; cxr yesterday with new density at left lung base with low grade fever most likely atelectasis.  Continue IS GI:  Passing flatus; denies nausea.  Will give dulcolax supp today and advance diet to clears.  Discussed with pt to take po's slowly and if nauseated, stop eating  Renal:  Creatinine remains normal with good uop.  Will d/c foley today Heme/ID:  Acute blood loss anemia-slightly lower today at 7.0 from 7.3.  May need a unit of PRBC's tomorrow if down again.  Fever-low grade-most likely from atelectasis.  Continue IS and encourage mobilization.   General:  Sitting up in bed in no distress;  Continue to mobilize oob tid at meal times;  PT and OT working with pt.  Will arrange [facility] needs  DVT prophylaxis:  Lovenox  Check labs in am  Doreatha Massed, [place] [facility] [phone] 08/28/2018 7:24 AM   I have seen and evaluated the patient. I agree with the PA note as documented above. POD#3 s/p aortobifem.  Bilateral PT's remain palpable.  Moved to floor.  Ng out.  RIJ central line out.  Good UOP.  Will d/c foley.  CLD.  Continue IS.  Passing flatus.  Acute blood loss anemia 7.3 --> 7.0.  Will recheck again tomorrow.  Cephus Shelling, MD [facility] [place] Office: [phone] Pager: [phone]

## 2018-08-29 ENCOUNTER — Encounter (HOSPITAL_COMMUNITY): Payer: Self-pay | Admitting: General Practice

## 2018-08-29 LAB — BASIC METABOLIC PANEL
Anion gap: 10 (ref 5–15)
BUN: 6 mg/dL (ref 6–20)
CO2: 21 mmol/L — ABNORMAL LOW (ref 22–32)
Calcium: 8.2 mg/dL — ABNORMAL LOW (ref 8.9–10.3)
Chloride: 108 mmol/L (ref 98–111)
Creatinine, Ser: 0.8 mg/dL (ref 0.44–1.00)
GFR calc Af Amer: >60 mL/min (ref >60)
GFR calc non Af Amer: >60 mL/min (ref >60)
Glucose, Bld: 140 mg/dL — ABNORMAL HIGH (ref 70–99)
Potassium: 3.3 mmol/L — ABNORMAL LOW (ref 3.5–5.1)
Sodium: 139 mmol/L (ref 135–145)

## 2018-08-29 LAB — CBC
HCT: 23.3 % — ABNORMAL LOW (ref 36.0–46.0)
Hemoglobin: 7.5 g/dL — ABNORMAL LOW (ref 12.0–15.0)
MCH: 27.8 pg (ref 26.0–34.0)
MCHC: 32.2 g/dL (ref 30.0–36.0)
MCV: 86.3 fL (ref 80.0–100.0)
Platelets: 218 10*3/uL (ref 150–400)
RBC: 2.7 MIL/uL — ABNORMAL LOW (ref 3.87–5.11)
RDW: 14.1 % (ref 11.5–15.5)
WBC: 11.4 10*3/uL — ABNORMAL HIGH (ref 4.0–10.5)
nRBC: 0 % (ref 0.0–0.2)

## 2018-08-29 MED ORDER — ASPIRIN EC 81 MG PO TBEC
81.0000 mg | DELAYED_RELEASE_TABLET | Freq: Every day | ORAL | Status: DC
  Administered 2018-08-29 – 2018-08-30 (×2): 81 mg via ORAL
  Filled 2018-08-29 (×2): qty 1

## 2018-08-29 MED ORDER — PANTOPRAZOLE SODIUM 40 MG PO TBEC
40.0000 mg | DELAYED_RELEASE_TABLET | Freq: Every day | ORAL | Status: DC
  Administered 2018-08-30: 40 mg via ORAL
  Filled 2018-08-29: qty 1

## 2018-08-29 NOTE — Progress Notes (Signed)
Physical Therapy Treatment Patient Details Name: Brianna Powers MRN: [id number] DOB: 1965/04/21 Today's Date: 08/29/2018    History of Present Illness Brianna Powers is a 53 y.o. female with history of diabetes and peripheral vascular disease that presents for follow-up to discuss recurrent left leg claudication.  Patient has extensive history of iliac disease and initially underwent bilateral common iliac stenting years ago in 2016 by Dr. Arbie Cookey.   Pt had aortobifemoral BPG on 08/25/2018.     PT Comments    Patient seen for mobility progression. Per nursing patient very uncomfortable in recliner and arm chair today. Patient overall min guard for functional mobility with light assist for trunk control and LE management for bed mobility. Ambulating in hallway with RW with min guard for safety - no physical assist needed. PT to continue to follow.     Follow Up Recommendations  Home health PT;Supervision/Assistance - 24 hour     Equipment Recommendations  Rolling walker with 5" wheels;3in1 (PT);Hospital bed    Recommendations for Other Services       Precautions / Restrictions Precautions Precautions: Fall Restrictions Weight Bearing Restrictions: No    Mobility  Bed Mobility Overal bed mobility: Needs Assistance Bed Mobility: Rolling;Sidelying to Sit;Sit to Sidelying Rolling: Min guard Sidelying to sit: Min assist     Sit to sidelying: Min assist General bed mobility comments: overall Min A for LE and trunk management  Transfers Overall transfer level: Needs assistance Equipment used: Rolling walker (2 wheeled) Transfers: Sit to/from [employer] Sit to Stand: Min guard Stand pivot transfers: Min guard       General transfer comment: min guard for safety and immediate standing balance  Ambulation/Gait Ambulation/Gait assistance: Min guard Gait Distance (Feet): 250 Feet Assistive device: Rolling walker (2 wheeled) Gait Pattern/deviations:  Step-through pattern;Decreased stride length;Trunk flexed Gait velocity: decreased   General Gait Details: slow steady pace of gait; no LOB or overt instability   Stairs             Wheelchair Mobility    Modified Rankin (Stroke Patients Only)       Balance Overall balance assessment: Needs assistance Sitting-balance support: No upper extremity supported;Feet supported Sitting balance-Leahy Scale: Good     Standing balance support: Bilateral upper extremity supported;During functional activity Standing balance-Leahy Scale: Fair                              Cognition Arousal/Alertness: Awake/alert Behavior During Therapy: WFL for tasks assessed/performed Overall Cognitive Status: Within Functional Limits for tasks assessed                                        Exercises      General Comments        Pertinent Vitals/Pain Pain Assessment: Faces Faces Pain Scale: Hurts a little bit Pain Location: abdomen Pain Descriptors / Indicators: Aching;Discomfort;Grimacing;Guarding Pain Intervention(s): Limited activity within patient's tolerance;Monitored during session;Repositioned    Home Living                      Prior Function            PT Goals (current goals can now be found in the care plan section) Acute Rehab PT Goals Patient Stated Goal: to go home PT Goal Formulation: With patient Time For Goal Achievement: 09/09/18  Potential to Achieve Goals: Good Progress towards PT goals: Progressing toward goals    Frequency    Min 3X/week      PT Plan Current plan remains appropriate    Co-evaluation              AM-PAC PT "6 Clicks" Mobility   Outcome Measure  Help needed turning from your back to your side while in a flat bed without using bedrails?: A Little Help needed moving from lying on your back to sitting on the side of a flat bed without using bedrails?: A Little Help needed moving to and from  a bed to a chair (including a wheelchair)?: A Little Help needed standing up from a chair using your arms (e.g., wheelchair or bedside chair)?: A Little Help needed to walk in hospital room?: A Little Help needed climbing 3-5 steps with a railing? : A Lot 6 Click Score: 17    End of Session Equipment Utilized During Treatment: Gait belt Activity Tolerance: Patient tolerated treatment well Patient left: in bed;with call bell/phone within reach Nurse Communication: Mobility status PT Visit Diagnosis: Unsteadiness on feet (R26.81);Muscle weakness (generalized) (M62.81);Pain     Time: [id number] PT Time Calculation (min) (ACUTE ONLY): 28 min  Charges:  $Gait Training: 8-22 mins $Therapeutic Activity: 8-22 mins                      Kipp LaurenceStephanie R Kharis Lapenna, PT, DPT Supplemental Physical Therapist 08/29/18 3:28 PM Pager: [phone] Office: [phone]

## 2018-08-29 NOTE — Progress Notes (Addendum)
[facility] [place]  Subjective  - Doing well, tolerated liquids yesterday without V/N.   Objective 135/75 95 98.7 F (37.1 C) (Oral) (!) 25 99%  Intake/Output Summary (Last 24 hours) at 08/29/2018 0726 Last data filed at 08/29/2018 7017 Gross per 24 hour  Intake 1620 ml  Output 700 ml  Net 920 ml    Abdomin soft, incision healing well Palpable pulses B LE Heart RRR Lungs non labored breathing  Assessment/Planning: POD # 4 53 y.o. female is s/p  Aortobifemoral bypass grafting  Patent aortobifemoral  bypass with palpable pedal pulses. Tolerated liquids will advance diet slowly today. Asymptomatic blood loss anemia HGB 7.5 this am Mobility improving + BM yesterday Patient was on Plavix pre-op, this has not been restarted.  I will start 81 aspirin.  Plavix has been discontinued iliac stents occluded, therefore the reason for bypass.   She is on Crestor.      Brianna Powers 08/29/2018 7:26 AM --  Laboratory Lab Results: Recent Labs    08/28/18 0241 08/29/18 0317  WBC 16.4* 11.4*  HGB 7.0* 7.5*  HCT 22.2* 23.3*  PLT 187 218   BMET Recent Labs    08/28/18 0241 08/29/18 0317  NA 134* 139  K 3.7 3.3*  CL 106 108  CO2 21* 21*  GLUCOSE 155* 140*  BUN 5* 6  CREATININE 0.86 0.80  CALCIUM 8.0* 8.2*    COAG Lab Results  Component Value Date   INR 1.3 (H) 08/25/2018   INR 1.0 08/19/2018   INR 1.06 01/26/2015   No results found for: PTT  I have seen and evaluated the patient. I agree with the PA note as documented above. Doing well POD #4 s/p aortobifem.  Tolerated CLD and had BM.  Will advance diet.  D/C IV fluids.  Hgb stable.  Palpable PT pulses bilaterally.  Discussed may be discharged over weekend and will arrange follow-up with me in 2-3 weeks for wound checks.  Keep groins dry.  Aspirin daily.    Cephus Shelling, MD [facility] [place] Office: [phone] Pager: [phone]

## 2018-08-29 NOTE — TOC Progression Note (Signed)
Transition of Care (TOC) - Progression Note  Donn Pierini RN, BSN Transitions of Care Unit 4E- RN Case Manager [phone]   Patient Details  Name: MAIDA NICKLAUS MRN: [id number] Date of Birth: Apr 10, 1965  Transition of Care [facility]) CM/SW Contact  Zenda Alpers Lenn Sink, RN Phone Number: 08/29/2018, 11:01 AM  Clinical Narrative:    Follow up done for hospital bed, order has been placed- spoke with Ian Malkin with [employer] and order has been sent for processing with insurance. CM spoke with pt at bedside who has already spoken with [employer] regarding bed- she does have copay of $23 dollars which she is in process of taking care of so that bed can be scheduled for delivery (hopefully today) in prep for a possible weekend discharge. 3n1 and RW have bee delivered to bedside already and pt ok with taking them with her on discharge via car. [facility]    Expected Discharge Plan: Home w Home Health Services Barriers to Discharge: No Barriers Identified  Expected Discharge Plan and Services Expected Discharge Plan: Home w Home Health Services In-house Referral: NA Discharge Planning Services: CM Consult Post Acute Care Choice: Durable Medical Equipment Living arrangements for the past 2 months: Single Family Home                 DME Arranged: 3-N-1, Walker rolling, Hospital bed DME Agency: [facility] Date DME Agency Contacted: 08/29/18 Time DME Agency Contacted: 1000 Representative spoke with at DME Agency: Oletha Cruel HH Arranged: PT HH Agency: [facility] Date HH Agency Contacted: 08/27/18 Time HH Agency Contacted: 1621 Representative spoke with at [facility] Agency: Lupita Leash   Social Determinants of Health (SDOH) Interventions    Readmission Risk Interventions Readmission Risk Prevention Plan 08/29/2018  Post Dischage Appt Complete  Medication Screening Complete  Transportation Screening Complete  Some recent data might be  hidden

## 2018-08-29 NOTE — Progress Notes (Signed)
Pt s/p Aortobifemoral bypass grafting, needs hospital bed post op for frequent repositioning.    Durable Medical Equipment (From admission, onward)       Start     Ordered  08/29/18 0740   For home use only DME Hospital bed  Once   Question Answer Comment Length of Need 6 Months  Patient has (list medical condition): s/p aortobifemoral bypass  The above medical condition requires: Patient requires the ability to reposition frequently  Bed type Semi-electric  Support Surface: Gel Overlay    08/29/18 0741  08/29/18 0737   For home use only DME Walker rolling  Once   Question:  Patient needs a walker to treat with the following condition  Answer:  Post-operative state  08/29/18 0739  08/27/18 1618   For home use only DME Walker rolling  Once   Question:  Patient needs a walker to treat with the following condition  Answer:  Weakness  08/27/18 1617  08/27/18 1618   For home use only DME 3 n 1  Once    08/27/18 1617

## 2018-08-30 MED ORDER — TRAMADOL HCL 50 MG PO TABS: 50.0000 mg | ORAL_TABLET | Freq: Four times a day (QID) | ORAL | 0 refills | Status: DC | PRN

## 2018-08-30 MED ORDER — ASPIRIN 81 MG PO TBEC: 81.0000 mg | DELAYED_RELEASE_TABLET | Freq: Every day | ORAL | Status: DC

## 2018-08-30 NOTE — TOC Transition Note (Signed)
Transition of Care [facility]) - CM/SW Discharge Note   Patient Details  Name: SAMYRA LIMB MRN: [id number] Date of Birth: 15-Apr-1965  Transition of Care [facility]) CM/SW Contact:  Bartholomew Crews, RN Phone Number: [phone] 08/30/2018, 9:07 AM   Clinical Narrative:    Patient to transition home today. Notified [facility] for [place] PT f/u. No other transition of care needs identified.    Final next level of care: [place] Barriers to Discharge: No Barriers Identified   Patient Goals and CMS Choice Patient states their goals for this hospitalization and ongoing recovery are:: get back to her self CMS Medicare.gov Compare Post Acute Care list provided to:: Patient Choice offered to / list presented to : Patient  Discharge Placement                       Discharge Plan and Services In-house Referral: NA Discharge Planning Services: CM Consult Post Acute Care Choice: Durable Medical Equipment          DME Arranged: 3-N-1, Walker rolling, Hospital bed DME Agency: [facility] Date DME Agency Contacted: 08/29/18 Time DME Agency Contacted: 1000 Representative spoke with at DME Agency: [place]: PT [place]: [place] ([place]) Date [place]: 08/27/18 Time [place]: 1621 Representative spoke with at [place]: [place] ([place]) Interventions     Readmission Risk Interventions Readmission Risk Prevention Plan 08/29/2018  Post Dischage Appt Complete  Medication Screening Complete  Transportation Screening Complete  Some recent data might be hidden

## 2018-08-30 NOTE — Discharge Summary (Signed)
AAA Discharge Summary    Brianna Powers 1965/08/19 53 y.o. female  [id number]  Admission Date: 08/25/2018  Discharge Date: 08/30/2018  Physician: Marty Heck, MD  Dr. Donzetta Matters  Admission Diagnosis: PERIPHERAL ARTERY DISEASE  Discharge Day services:   See progress note 08/30/2018 Physical Exam: Vitals:   08/29/18 2040 08/30/18 0530  BP: (!) 148/89 129/83  Pulse: 93 83  Resp: (!) 24 (!) 23  Temp: 97.8 F (36.6 C) 98.2 F (36.8 C)  SpO2: 100% 97%    Hospital Course:  The patient was admitted to the hospital and taken to the operating room on 08/25/2018 and underwent ortobifemoral bypass by Dr. Carlis Abbott.  This was performed due to lifestyle limiting short distance claudication of the left lower extremity with occluded left common iliac stent after redo x3.  She tolerated procedure well and was admitted to the ICU postoperatively.  POD #1 patient was not requiring any pressor support and was stable enough for transfer to stepdown unit.  Over the course of her hospital stay mobility was increased, diet was advanced, and pain has been controlled.  At the time of discharge she is palpable pedal pulses bilaterally.  We will discontinue her Plavix as this was taken due to her iliac stent.  She will need to take a aspirin and statin daily.  Based on physical therapy recommendations she will require home health physical therapy.  This was arranged by case Freight forwarder.  She will follow-up in office to see Dr. Carlis Abbott in about 2 to 3 weeks.  She will be prescribed 2 to 3 days of tramadol for continued postoperative pain control.  Discharge instructions were reviewed with the patient and she voices her understanding.  She will be discharged this morning in stable condition with home health PT.   CBC    Component Value Date/Time   WBC 11.4 (H) 08/29/2018 0317   RBC 2.70 (L) 08/29/2018 0317   HGB 7.5 (L) 08/29/2018 0317   HCT 23.3 (L) 08/29/2018 0317   PLT 218 08/29/2018 0317   MCV 86.3  08/29/2018 0317   MCH 27.8 08/29/2018 0317   MCHC 32.2 08/29/2018 0317   RDW 14.1 08/29/2018 0317    BMET    Component Value Date/Time   NA 139 08/29/2018 0317   K 3.3 (L) 08/29/2018 0317   CL 108 08/29/2018 0317   CO2 21 (L) 08/29/2018 0317   GLUCOSE 140 (H) 08/29/2018 0317   BUN 6 08/29/2018 0317   CREATININE 0.80 08/29/2018 0317   CALCIUM 8.2 (L) 08/29/2018 0317   GFRNONAA >60 08/29/2018 0317   GFRAA >60 08/29/2018 0317       Discharge Diagnosis:  PERIPHERAL ARTERY DISEASE  Secondary Diagnosis: Patient Active Problem List   Diagnosis Date Noted  . Chronic aorto-iliac occlusion syndrome ([place]) 08/25/2018  . Aortoiliac occlusive disease ([place]) 08/25/2018  . Atherosclerosis of native arteries of extremity with intermittent claudication ([place]) 05/27/2018  . Aortic thrombus ([place]) 01/26/2015   Past Medical History:  Diagnosis Date  . Hypothyroidism   . Peripheral vascular disease ([place])      Allergies as of 08/30/2018      Reactions   Amoxicillin Shortness Of Breath, Rash, Other (See Comments)   Panic attacks.       Medication List    STOP taking these medications   clopidogrel 75 MG tablet Commonly known as:  PLAVIX     TAKE these medications   Accu-Chek Aviva Plus test strip Generic drug:  glucose blood TEST twice  a day   Accu-Chek Softclix Lancets lancets TEST twice a day   acetaminophen 500 MG tablet Commonly known as:  TYLENOL Take 1,000 mg by mouth 2 (two) times daily as needed for moderate pain.   aspirin 81 MG EC tablet Take 1 tablet (81 mg total) by mouth daily.   gabapentin 100 MG capsule Commonly known as:  Neurontin Take 1 capsule (100 mg total) by mouth 3 (three) times daily.   glucose monitoring kit monitoring kit 1 each by Does not apply route as needed for other. Use as directed   levothyroxine 75 MCG tablet Commonly known as:  SYNTHROID Take 75 mcg by mouth daily before breakfast.   rosuvastatin 10 MG tablet Commonly known as:   CRESTOR Take 10 mg by mouth at bedtime.   traMADol 50 MG tablet Commonly known as:  ULTRAM Take 1 tablet (50 mg total) by mouth every 6 (six) hours as needed (PAIN).            Durable Medical Equipment  (From admission, onward)         Start     Ordered   08/29/18 0740  For home use only DME Hospital bed  Once    Question Answer Comment  Length of Need 6 Months   Patient has (list medical condition): s/p aortobifemoral bypass   The above medical condition requires: Patient requires the ability to reposition frequently   Bed type Semi-electric   Support Surface: Gel Overlay      08/29/18 0741   08/29/18 0737  For home use only DME Walker rolling  Once    Question:  Patient needs a walker to treat with the following condition  Answer:  Post-operative state   08/29/18 0739   08/27/18 1618  For home use only DME Walker rolling  Once    Question:  Patient needs a walker to treat with the following condition  Answer:  Weakness   08/27/18 1617   08/27/18 1618  For home use only DME 3 n 1  Once     08/27/18 1617          Instructions:  [facility] Discharge Instructions Open Aortic Surgery  Please refer to the following instructions for your post-procedure care. Your surgeon or Physician Assistant will discuss any changes with you.  Activity  Avoid lifting more than eight pounds (a gallon of milk) until after your first post-operative visit. You are encouraged to walk as much as you can. You can slowly return to normal activities but must avoid strenuous activity and heavy lifting until your doctor tells you it's OK. Heavy lifting can hurt the incision and cause a hernia. Avoid activities such as vacuuming or swinging a golf club. It is normal to feel tired for several weeks after your surgery. Do not drive until your doctor gives the OK and you are no longer taking prescription pain medications. It is also normal to have difficulty with sleep  habits, eating and bowl movements after surgery. These will go away with time.  Bathing/Showering  You may shower after you go home. Do not soak in a bathtub, hot tub, or swim until the incision heals.  Incision Care  Shower every day. Clean your incision with mild soap and water. Pat the area dry with a clean towel. You do not need a bandage unless otherwise instructed. Do not apply any ointments or creams to your incision. You may have skin glue on your incision. Do  not peel it off. It will come off on its own in about one week. If you have staples or sutures along your incision, they will be removed at your post op appointment.  If you have groin incisions, wash the groin wounds with soap and water daily and pat dry. (No tub bath-only shower)  Then put a dry gauze or washcloth in the groin to keep this area dry to help prevent wound infection.  Do this daily and as needed.  Do not use Vaseline or neosporin on your incisions.  Only use soap and water on your incisions and then protect and keep dry.  Diet  Resume your normal diet. There are no special food restriction following this procedure. A low fat/low cholesterol diet is recommended for all patients with vascular disease. After your aortic surgery, it's normal to feel full faster than usual and to not feel as hungry as you normally would. You will probably lose weight initially following your surgery. It's best to eat small, frequent meals over the course of the day. Call the office if you find that you are unable to eat even small meals. In order to heal from your surgery, it is CRITICAL to get adequate nutrition. Your body requires vitamins, minerals, and protein. Vegetables are the best source of vitamins and minerals. causing pain, you may take over-the-counter pain reliever such as acetaminophen (Tylenol). If you were prescribed a stronger pain medication, please be aware these medication can cause nausea and constipation. Prevent nausea  by taking the medication with a snack or meal. Avoid constipation by drinking plenty of fluids and eating foods with a high amount of fiber, such as fruits, vegetables and grains. Take 128m of the over-the-counter stool softener Colace twice a day as needed to help with constipation. A laxative, such as Milk of Magnesia, may be recommended for you at this time. Do not take a laxative unless your surgeon or Physician Assistant. tells you it's OK. Do not take Tylenol if you are taking stronger pain medications (such as Percocet).  Follow Up  Our office will schedule a follow up appointment 2-3 weeks after discharge.  Please call [place] for any of the following conditions    .     Severe or worsening pain in your legs or feet or in your abdomen back or chest. Increased pain, redness drainage (pus) from your incision site. Increased abdominal pain, bloating, nausea, vomiting, or persistent diarrhea. Fever of 101 degrees or higher. Swelling in your leg (s).  Reduce your risk of vascular disease  Stop smoking. If you would like help, call [facility] at 1-800-QUIT-NOW (561-204-2745 or CLaresat 3(228) 118-0452 Manage your cholesterol Maintain a desired weight Control your diabetes Keep your blood pressure down  If you have any questions please call the office at [phone]  Disposition: home with HHPT  Patient's condition: is Good  Follow up: 1. Dr. CCarlis Abbottin 2 weeks   MDagoberto Ligas PA-C [facility] [phone]/08/2018  9:11 AM   - For VQI Registry use -  Post-op:  Time to Extubation: [x]  In OR, [ ]  < 12 hrs, [ ]  12-24 hrs, [ ]  >=24 hrs Vasopressors Req. Post-op: No ICU Stay: 1 day in ICU Transfusion: No MI: No, [ ]  Troponin only, [ ]  EKG or Clinical New Arrhythmia: No  Complications: CHF: No Resp failure: No, [ ]  Pneumonia, [ ]  Ventilator Chg in renal function: No, [ ]  Inc. Cr > 0.5, [ ]  Temp. Dialysis, [ ]   Permanent dialysis Leg  ischemia: No, no Surgery needed, [ ]  Yes, Surgery needed, [ ]  Amputation Bowel ischemia: No, [ ]  Medical Rx, [ ]  Surgical Rx Wound complication: No, [ ]  Superficial separation/infection, [ ]  Return to OR Return to OR: No  Return to OR for bleeding: No Stroke: No, [ ]  Minor, [ ]  Major  Discharge medications: Statin use:  Yes  ASA use:  Yes  Plavix use:  No  Beta blocker use:  No  ACEI use:  No ARB use:  No CCB use:  No Coumadin use:  No

## 2018-08-30 NOTE — Progress Notes (Addendum)
  Progress Note    08/30/2018 8:31 AM 5 Days Post-Op  Subjective:  Tolerating regular diet.  Had another BM.  Wants to go home.   Vitals:   08/29/18 2040 08/30/18 0530  BP: (!) 148/89 129/83  Pulse: 93 83  Resp: (!) 24 (!) 23  Temp: 97.8 F (36.6 C) 98.2 F (36.8 C)  SpO2: 100% 97%   Physical Exam: Lungs:  Non labored Incisions:  Midline incision healing well; B groin incisions also healing well Extremities:  Feet symmetrically warm to touch with good cap refill Abdomen:  Soft Neurologic: A&O  CBC    Component Value Date/Time   WBC 11.4 (H) 08/29/2018 0317   RBC 2.70 (L) 08/29/2018 0317   HGB 7.5 (L) 08/29/2018 0317   HCT 23.3 (L) 08/29/2018 0317   PLT 218 08/29/2018 0317   MCV 86.3 08/29/2018 0317   MCH 27.8 08/29/2018 0317   MCHC 32.2 08/29/2018 0317   RDW 14.1 08/29/2018 0317    BMET    Component Value Date/Time   NA 139 08/29/2018 0317   K 3.3 (L) 08/29/2018 0317   CL 108 08/29/2018 0317   CO2 21 (L) 08/29/2018 0317   GLUCOSE 140 (H) 08/29/2018 0317   BUN 6 08/29/2018 0317   CREATININE 0.80 08/29/2018 0317   CALCIUM 8.2 (L) 08/29/2018 0317   GFRNONAA >60 08/29/2018 0317   GFRAA >60 08/29/2018 0317    INR    Component Value Date/Time   INR 1.3 (H) 08/25/2018 1413     Intake/Output Summary (Last 24 hours) at 08/30/2018 0831 Last data filed at 08/29/2018 2300 Gross per 24 hour  Intake 600 ml  Output 350 ml  Net 250 ml     Assessment/Plan:  53 y.o. female is s/p ABF bypass 5 Days Post-Op   Perfusing BLE well Tolerating regular diet Ambulating without difficulty Discharge home with [facility] PT this morning Follow up with Dr. Carlis Abbott in 2-3 weeks  Dagoberto Ligas, PA-C [facility] [phone] 08/30/2018 8:31 AM  I have independently interviewed and examined patient and agree with PA assessment and plan above.   Brandon C. Donzetta Matters, MD [facility] [place] Office: [phone] Pager: [phone]

## 2018-08-31 LAB — BPAM RBC
Blood Product Expiration Date: 202006142359
Blood Product Expiration Date: 202006142359
Unit Type and Rh: 6200
Unit Type and Rh: 6200

## 2018-08-31 LAB — TYPE AND SCREEN
ABO/RH(D): A POS
Antibody Screen: NEGATIVE
Unit division: 0
Unit division: 0

## 2018-09-01 DIAGNOSIS — Z95820 Peripheral vascular angioplasty status with implants and grafts: Secondary | ICD-10-CM | POA: Diagnosis not present

## 2018-09-01 DIAGNOSIS — E039 Hypothyroidism, unspecified: Secondary | ICD-10-CM | POA: Diagnosis not present

## 2018-09-01 DIAGNOSIS — Z48812 Encounter for surgical aftercare following surgery on the circulatory system: Secondary | ICD-10-CM | POA: Diagnosis not present

## 2018-09-01 DIAGNOSIS — I7409 Other arterial embolism and thrombosis of abdominal aorta: Secondary | ICD-10-CM | POA: Diagnosis not present

## 2018-09-01 DIAGNOSIS — Z87891 Personal history of nicotine dependence: Secondary | ICD-10-CM | POA: Diagnosis not present

## 2018-09-01 DIAGNOSIS — E1151 Type 2 diabetes mellitus with diabetic peripheral angiopathy without gangrene: Secondary | ICD-10-CM | POA: Diagnosis not present

## 2018-09-01 MED FILL — Sodium Chloride Irrigation Soln 0.9%: Qty: 3000 | Status: AC

## 2018-09-01 MED FILL — Sodium Chloride IV Soln 0.9%: INTRAVENOUS | Qty: 1000 | Status: AC

## 2018-09-01 MED FILL — Heparin Sodium (Porcine) Inj 1000 Unit/ML: INTRAMUSCULAR | Qty: 30 | Status: AC

## 2018-09-05 DIAGNOSIS — E039 Hypothyroidism, unspecified: Secondary | ICD-10-CM | POA: Diagnosis not present

## 2018-09-05 DIAGNOSIS — Z48812 Encounter for surgical aftercare following surgery on the circulatory system: Secondary | ICD-10-CM | POA: Diagnosis not present

## 2018-09-05 DIAGNOSIS — E1151 Type 2 diabetes mellitus with diabetic peripheral angiopathy without gangrene: Secondary | ICD-10-CM | POA: Diagnosis not present

## 2018-09-05 DIAGNOSIS — Z95820 Peripheral vascular angioplasty status with implants and grafts: Secondary | ICD-10-CM | POA: Diagnosis not present

## 2018-09-05 DIAGNOSIS — I7409 Other arterial embolism and thrombosis of abdominal aorta: Secondary | ICD-10-CM | POA: Diagnosis not present

## 2018-09-05 DIAGNOSIS — Z87891 Personal history of nicotine dependence: Secondary | ICD-10-CM | POA: Diagnosis not present

## 2018-09-12 DIAGNOSIS — Z87891 Personal history of nicotine dependence: Secondary | ICD-10-CM | POA: Diagnosis not present

## 2018-09-12 DIAGNOSIS — Z95820 Peripheral vascular angioplasty status with implants and grafts: Secondary | ICD-10-CM | POA: Diagnosis not present

## 2018-09-12 DIAGNOSIS — E1151 Type 2 diabetes mellitus with diabetic peripheral angiopathy without gangrene: Secondary | ICD-10-CM | POA: Diagnosis not present

## 2018-09-12 DIAGNOSIS — I7409 Other arterial embolism and thrombosis of abdominal aorta: Secondary | ICD-10-CM | POA: Diagnosis not present

## 2018-09-12 DIAGNOSIS — Z48812 Encounter for surgical aftercare following surgery on the circulatory system: Secondary | ICD-10-CM | POA: Diagnosis not present

## 2018-09-12 DIAGNOSIS — E039 Hypothyroidism, unspecified: Secondary | ICD-10-CM | POA: Diagnosis not present

## 2018-09-16 ENCOUNTER — Encounter: Payer: Self-pay | Admitting: Vascular Surgery

## 2018-09-16 ENCOUNTER — Telehealth: Payer: Self-pay

## 2018-09-16 ENCOUNTER — Other Ambulatory Visit: Payer: Self-pay

## 2018-09-16 ENCOUNTER — Ambulatory Visit (INDEPENDENT_AMBULATORY_CARE_PROVIDER_SITE_OTHER): Payer: Self-pay | Admitting: Vascular Surgery

## 2018-09-16 VITALS — BP 132/89 | HR 93 | Temp 98.0°F | Resp 14 | Ht 61.0 in | Wt 157.0 lb

## 2018-09-16 DIAGNOSIS — I7409 Other arterial embolism and thrombosis of abdominal aorta: Secondary | ICD-10-CM

## 2018-09-16 DIAGNOSIS — I70212 Atherosclerosis of native arteries of extremities with intermittent claudication, left leg: Secondary | ICD-10-CM

## 2018-09-16 MED ORDER — POLYETHYLENE GLYCOL 3350 17 G PO PACK: 17.0000 g | PACK | Freq: Every day | ORAL | 0 refills | Status: DC

## 2018-09-16 MED ORDER — TRAMADOL HCL 50 MG PO TABS: 50.0000 mg | ORAL_TABLET | Freq: Four times a day (QID) | ORAL | 0 refills | Status: DC | PRN

## 2018-09-16 NOTE — Telephone Encounter (Signed)
Pt called asking if she can take a bath to soak her bottom. She has been constipated which she discussed at her appt today. I recommended a sitz bath, as to not soak entire body in bath s/p surgery. She is taking stool softener and laxative and we discussed eating less binding foods, drinking more water, etc. She will call back if she is still unable to have a bowel movement. Pt verbalized understanding; no further questions at this time.

## 2018-09-16 NOTE — Progress Notes (Signed)
Patient name: Brianna Powers MRN: [id number] DOB: 1965-05-25 Sex: female  REASON FOR VISIT: 3-week follow-up status post aortobifemoral bypass  HPI: Brianna Powers is a 52 y.o. female that presents for 3-week follow-up after aortobifemoral bypass for bilateral lower extremity lifestyle limiting claudication.  Overall patient did very well in the hospital with no immediate complications.  At home she states that she is struggling with constipation and this has been her main issue and some ongoing discomfort with the incisions.  She still taking tramadol for pain medicine about every 6 hours.  Regarding her constipation she has tried stool softener as well as some laxatives and states that is been her biggest issue.  No fevers or chills.  Groins as well as midline incision have healed without issue.  She has been walking at home and feels like her feet are doing much better.  Past Medical History:  Diagnosis Date  . Hypothyroidism   . Peripheral vascular disease [facility])     Past Surgical History:  Procedure Laterality Date  . ABDOMINAL AORTOGRAM N/A 05/02/2016   Procedure: Abdominal Aortogram;  Surgeon: Waynetta Sandy, MD;  Location: [place] CV LAB;  Service: Cardiovascular;  Laterality: N/A;  . ABDOMINAL AORTOGRAM W/LOWER EXTREMITY N/A 11/28/2017   Procedure: ABDOMINAL AORTOGRAM W/LOWER EXTREMITY;  Surgeon: Marty Heck, MD;  Location: [place] CV LAB;  Service: Cardiovascular;  Laterality: N/A;  Bilatertal  . AORTA - BILATERAL FEMORAL ARTERY BYPASS GRAFT Bilateral 08/25/2018   Procedure: AORTA BIFEMORAL BYPASS USING HEMASHIELD GOLD 14 x 7MM GRAFT;  Surgeon: Marty Heck, MD;  Location: [place];  Service: Vascular;  Laterality: Bilateral;  . FEMORAL ARTERY EXPLORATION Bilateral 01/28/2015   Procedure: BILATERAL FEMORAL ARTERY EXPLORATION;  Surgeon: Rosetta Posner, MD;  Location: [place];  Service: Vascular;  Laterality: Bilateral;  . INSERTION OF ILIAC STENT Bilateral  01/28/2015   Procedure: INSERTION OF RIGHT AND LEFT ILIAC STENT;  Surgeon: Rosetta Posner, MD;  Location: [place];  Service: Vascular;  Laterality: Bilateral;  . LOWER EXTREMITY ANGIOGRAPHY N/A 05/02/2016   Procedure: Bilateral Illac Stent;  Surgeon: Waynetta Sandy, MD;  Location: [place] CV LAB;  Service: Cardiovascular;  Laterality: N/A;  . PERIPHERAL VASCULAR CATHETERIZATION N/A 01/26/2015   Procedure: Abdominal Aortogram;  Surgeon: Rosetta Posner, MD;  Location: [place] CV LAB;  Service: Cardiovascular;  Laterality: N/A;  . PERIPHERAL VASCULAR CATHETERIZATION N/A 04/19/2016   Procedure: Abdominal Aortogram w/Lower Extremity;  Surgeon: Conrad Fairview, MD;  Location: [place] CV LAB;  Service: Cardiovascular;  Laterality: N/A;  . PERIPHERAL VASCULAR INTERVENTION Bilateral 11/28/2017   Procedure: PERIPHERAL VASCULAR INTERVENTION;  Surgeon: Marty Heck, MD;  Location: [place] CV LAB;  Service: Cardiovascular;  Laterality: Bilateral;  Iliac    Family History  Problem Relation Age of Onset  . CAD Father   . Diabetes type II Father     SOCIAL HISTORY: Social History   Tobacco Use  . Smoking status: Former Smoker    Quit date: 05/25/2014    Years since quitting: 4.3  . Smokeless tobacco: Never Used  Substance Use Topics  . Alcohol use: No    Alcohol/week: 0.0 standard drinks    Allergies  Allergen Reactions  . Amoxicillin Shortness Of Breath, Rash and Other (See Comments)    Panic attacks.     Current Outpatient Medications  Medication Sig Dispense Refill  . acetaminophen (TYLENOL) 500 MG tablet Take 1,000 mg by mouth 2 (two) times daily  as needed for moderate pain.    Marland Kitchen aspirin EC 81 MG EC tablet Take 1 tablet (81 mg total) by mouth daily.    . rosuvastatin (CRESTOR) 10 MG tablet Take 10 mg by mouth at bedtime.     . traMADol (ULTRAM) 50 MG tablet Take 1 tablet (50 mg total) by mouth every 6 (six) hours as needed (PAIN). 25 tablet 0  . ACCU-CHEK AVIVA PLUS  test strip TEST twice a day (Patient not taking: Reported on 06/17/2018) 50 each PRN  . ACCU-CHEK SOFTCLIX LANCETS lancets TEST twice a day (Patient not taking: Reported on 06/17/2018) 100 each PRN  . gabapentin (NEURONTIN) 100 MG capsule Take 1 capsule (100 mg total) by mouth 3 (three) times daily. (Patient not taking: Reported on 07/21/2018) 20 capsule 0  . glucose monitoring kit (FREESTYLE) monitoring kit 1 each by Does not apply route as needed for other. Use as directed (Patient not taking: Reported on 06/17/2018) 1 each 0  . levothyroxine (SYNTHROID, LEVOTHROID) 75 MCG tablet Take 75 mcg by mouth daily before breakfast.    . metFORMIN (GLUCOPHAGE-XR) 500 MG 24 hr tablet Take 500 mg by mouth daily.    . polyethylene glycol (MIRALAX) 17 g packet Take 17 g by mouth daily. 14 each 0   No current facility-administered medications for this visit.     REVIEW OF SYSTEMS:  [X]  denotes positive finding, [ ]  denotes negative finding Cardiac  Comments:  Chest pain or chest pressure:    Shortness of breath upon exertion:    Short of breath when lying flat:    Irregular heart rhythm:        Vascular    Pain in calf, thigh, or hip brought on by ambulation:    Pain in feet at night that wakes you up from your sleep:     Blood clot in your veins:    Leg swelling:         Pulmonary    Oxygen at home:    Productive cough:     Wheezing:         Neurologic    Sudden weakness in arms or legs:     Sudden numbness in arms or legs:     Sudden onset of difficulty speaking or slurred speech:    Temporary loss of vision in one eye:     Problems with dizziness:         Gastrointestinal    Blood in stool:     Vomited blood:         Genitourinary    Burning when urinating:     Blood in urine:        Psychiatric    Major depression:         Hematologic    Bleeding problems:    Problems with blood clotting too easily:        Skin    Rashes or ulcers:        Constitutional    Fever or chills:       PHYSICAL EXAM: Vitals:   09/16/18 0846  BP: 132/89  Pulse: 93  Resp: 14  Temp: 98 F (36.7 C)  TempSrc: Temporal  SpO2: 99%  Weight: 157 lb (71.2 kg)  Height: 5' 1"  (1.549 m)    GENERAL: The patient is a well-nourished female, in no acute distress. The vital signs are documented above. CARDIAC: There is a regular rate and rhythm.  VASCULAR:  Midline and bilateral groin incisions well healed,  large pannus, groins with dry dressings Palpable femoral pulses both groins Monophasic DP bilaterally Brisk PT's bilaterally with doppler  DATA:   None  Assessment/Plan:  54 year old female status post aortobifemoral bypass on 08/25/18 for bilateral lower extremity lifestyle limiting claudication and failed left iliac stents in the past.  Overall she is doing well today both of her groin incisions as well as her midline incision have healed without issue.  She has brisk posterior tibial signals at her ankle that are significantly improved and her symptoms are much better when walking.  I will plan to get ABIs now for baseline evaluation.  I will plan to see her back in 9 months with repeat ABIs at that time.  I did send another prescription for tramadol to her pharmacy as well as a prescription for MiraLAX to help with her constipation.  She is not ready to go back to work yet and will contact her office hopefully in the next few weeks for a letter to return to work when she feels like she is back to her baseline.  Continue to keep groins dry.   Marty Heck, MD [facility] [place] Office: [phone] Pager: [phone]

## 2018-09-17 DIAGNOSIS — I7409 Other arterial embolism and thrombosis of abdominal aorta: Secondary | ICD-10-CM | POA: Diagnosis not present

## 2018-09-17 DIAGNOSIS — Z48812 Encounter for surgical aftercare following surgery on the circulatory system: Secondary | ICD-10-CM | POA: Diagnosis not present

## 2018-09-17 DIAGNOSIS — E1151 Type 2 diabetes mellitus with diabetic peripheral angiopathy without gangrene: Secondary | ICD-10-CM | POA: Diagnosis not present

## 2018-09-17 DIAGNOSIS — Z95820 Peripheral vascular angioplasty status with implants and grafts: Secondary | ICD-10-CM | POA: Diagnosis not present

## 2018-09-17 DIAGNOSIS — E039 Hypothyroidism, unspecified: Secondary | ICD-10-CM | POA: Diagnosis not present

## 2018-09-17 DIAGNOSIS — Z87891 Personal history of nicotine dependence: Secondary | ICD-10-CM | POA: Diagnosis not present

## 2018-09-23 ENCOUNTER — Encounter (HOSPITAL_COMMUNITY): Payer: BC Managed Care – PPO

## 2018-09-23 ENCOUNTER — Other Ambulatory Visit: Payer: Self-pay

## 2018-09-23 DIAGNOSIS — I70212 Atherosclerosis of native arteries of extremities with intermittent claudication, left leg: Secondary | ICD-10-CM

## 2018-09-23 DIAGNOSIS — I739 Peripheral vascular disease, unspecified: Secondary | ICD-10-CM

## 2018-09-29 ENCOUNTER — Telehealth (HOSPITAL_COMMUNITY): Payer: Self-pay

## 2018-09-30 ENCOUNTER — Ambulatory Visit (HOSPITAL_COMMUNITY)
Admission: RE | Admit: 2018-09-30 | Discharge: 2018-09-30 | Disposition: A | Discharge status: Home / Self Care | Payer: BC Managed Care – PPO | Source: Ambulatory Visit | Attending: Vascular Surgery | Admitting: Vascular Surgery

## 2018-09-30 ENCOUNTER — Other Ambulatory Visit: Payer: Self-pay

## 2018-09-30 DIAGNOSIS — I70212 Atherosclerosis of native arteries of extremities with intermittent claudication, left leg: Secondary | ICD-10-CM | POA: Diagnosis not present

## 2018-09-30 DIAGNOSIS — I739 Peripheral vascular disease, unspecified: Secondary | ICD-10-CM | POA: Diagnosis not present

## 2018-10-20 ENCOUNTER — Telehealth: Payer: Self-pay | Admitting: *Deleted

## 2018-10-20 NOTE — Telephone Encounter (Signed)
Patient called and requested an appointment to see Dr. Carlis Abbott. She states she is having "intermittent abdominal pain and leg pain". " Feels like cramping pain like menstrual cramps". No other symptoms. She is concerned about returning to work. She states she is scheduled to return on 11/10/2018. I instructed her to call this office back to see if there is any cancellation to see Dr. Carlis Abbott earlier and to go to ER for any acute or worsening condition.

## 2018-10-21 ENCOUNTER — Encounter: Payer: Self-pay | Admitting: Vascular Surgery

## 2018-11-04 ENCOUNTER — Other Ambulatory Visit: Payer: Self-pay

## 2018-11-04 ENCOUNTER — Ambulatory Visit (INDEPENDENT_AMBULATORY_CARE_PROVIDER_SITE_OTHER): Payer: Self-pay | Admitting: Vascular Surgery

## 2018-11-04 ENCOUNTER — Encounter: Payer: Self-pay | Admitting: Vascular Surgery

## 2018-11-04 ENCOUNTER — Encounter: Payer: Self-pay | Admitting: *Deleted

## 2018-11-04 VITALS — BP 125/90 | HR 90 | Temp 98.0°F | Resp 14 | Ht 61.0 in | Wt 154.0 lb

## 2018-11-04 DIAGNOSIS — I7409 Other arterial embolism and thrombosis of abdominal aorta: Secondary | ICD-10-CM

## 2018-11-04 NOTE — Progress Notes (Signed)
Patient name: Brianna Powers MRN: [id number] DOB: April 30, 1965 Sex: female  REASON FOR VISIT: Triage evaluation for abdominal pain after aortobifemoral bypass for left lower extremity lifestyle limiting claudication and occluded left iliac stent  HPI: Brianna Powers is a 53 y.o. female who presents for triage evaluation for abdominal pain in the setting of recent aortobifemoral bypass on 08/25/2018 for lifestyle limiting claudication of left lower extremity with occluded left iliac stent.  Patient states she initially was doing well but then may have overdone it.  She feels like in the morning she is ovulating in her lower abdomen with some crampy pain that comes and goes.  No nausea no vomiting.  Having normal bowel function.  Eating ok.  In addition she has some twinges and pains in her left thigh particularly at night that bother her at times.  Overall her feet feel much better.  She is no longer claudicating when she walks.  She is fairly emotional in clinic.  States she is overwhelmed taking care of her mother as well as concerned about going back to work and has a lot of stress at home.  She is also fearful that this could occlude.    Past Medical History:  Diagnosis Date  . Hypothyroidism   . Peripheral vascular disease [facility])     Past Surgical History:  Procedure Laterality Date  . ABDOMINAL AORTOGRAM N/A 05/02/2016   Procedure: Abdominal Aortogram;  Surgeon: Waynetta Sandy, MD;  Location: [place] [facility];  Service: Cardiovascular;  Laterality: N/A;  . ABDOMINAL AORTOGRAM W/LOWER EXTREMITY N/A 11/28/2017   Procedure: ABDOMINAL AORTOGRAM W/LOWER EXTREMITY;  Surgeon: Marty Heck, MD;  Location: [place] [facility];  Service: Cardiovascular;  Laterality: N/A;  Bilatertal  . AORTA - BILATERAL FEMORAL ARTERY BYPASS GRAFT Bilateral 08/25/2018   Procedure: AORTA BIFEMORAL BYPASS USING HEMASHIELD GOLD 14 x 7MM GRAFT;  Surgeon: Marty Heck, MD;  Location: [place];   Service: Vascular;  Laterality: Bilateral;  . FEMORAL ARTERY EXPLORATION Bilateral 01/28/2015   Procedure: BILATERAL FEMORAL ARTERY EXPLORATION;  Surgeon: Rosetta Posner, MD;  Location: [place];  Service: Vascular;  Laterality: Bilateral;  . INSERTION OF ILIAC STENT Bilateral 01/28/2015   Procedure: INSERTION OF RIGHT AND LEFT ILIAC STENT;  Surgeon: Rosetta Posner, MD;  Location: [place];  Service: Vascular;  Laterality: Bilateral;  . LOWER EXTREMITY ANGIOGRAPHY N/A 05/02/2016   Procedure: Bilateral Illac Stent;  Surgeon: Waynetta Sandy, MD;  Location: [place] [facility];  Service: Cardiovascular;  Laterality: N/A;  . PERIPHERAL VASCULAR CATHETERIZATION N/A 01/26/2015   Procedure: Abdominal Aortogram;  Surgeon: Rosetta Posner, MD;  Location: [place] [facility];  Service: Cardiovascular;  Laterality: N/A;  . PERIPHERAL VASCULAR CATHETERIZATION N/A 04/19/2016   Procedure: Abdominal Aortogram w/Lower Extremity;  Surgeon: Conrad [place], MD;  Location: [place] [facility];  Service: Cardiovascular;  Laterality: N/A;  . PERIPHERAL VASCULAR INTERVENTION Bilateral 11/28/2017   Procedure: PERIPHERAL VASCULAR INTERVENTION;  Surgeon: Marty Heck, MD;  Location: [place] [facility];  Service: Cardiovascular;  Laterality: Bilateral;  Iliac    Family History  Problem Relation Age of Onset  . CAD Father   . Diabetes type II Father     SOCIAL HISTORY: Social History   Tobacco Use  . Smoking status: Former Smoker    Quit date: 05/25/2014    Years since quitting: 4.4  . Smokeless tobacco: Never Used  Substance Use Topics  . Alcohol use: No  Alcohol/week: 0.0 standard drinks    Allergies  Allergen Reactions  . Amoxicillin Shortness Of Breath, Rash and Other (See Comments)    Panic attacks.     Current Outpatient Medications  Medication Sig Dispense Refill  . acetaminophen (TYLENOL) 500 MG tablet Take 1,000 mg by mouth 2 (two) times daily as needed for moderate pain.    Marland Kitchen aspirin EC 81 MG EC  tablet Take 1 tablet (81 mg total) by mouth daily.    Marland Kitchen gabapentin (NEURONTIN) 100 MG capsule Take 1 capsule (100 mg total) by mouth 3 (three) times daily. 20 capsule 0  . levothyroxine (SYNTHROID, LEVOTHROID) 75 MCG tablet Take 75 mcg by mouth daily before breakfast.    . polyethylene glycol (MIRALAX) 17 g packet Take 17 g by mouth daily. 14 each 0  . rosuvastatin (CRESTOR) 10 MG tablet Take 10 mg by mouth at bedtime.     . traMADol (ULTRAM) 50 MG tablet Take 1 tablet (50 mg total) by mouth every 6 (six) hours as needed (PAIN). 25 tablet 0  . ACCU-CHEK AVIVA PLUS test strip TEST twice a day (Patient not taking: Reported on 11/04/2018) 50 each PRN  . ACCU-CHEK SOFTCLIX LANCETS lancets TEST twice a day (Patient not taking: Reported on 11/04/2018) 100 each PRN  . glucose monitoring kit (FREESTYLE) monitoring kit 1 each by Does not apply route as needed for other. Use as directed (Patient not taking: Reported on 11/04/2018) 1 each 0  . metFORMIN (GLUCOPHAGE-XR) 500 MG 24 hr tablet Take 500 mg by mouth daily.     No current facility-administered medications for this visit.     REVIEW OF SYSTEMS:  [X]  denotes positive finding, [ ]  denotes negative finding Cardiac  Comments:  Chest pain or chest pressure:    Shortness of breath upon exertion:    Short of breath when lying flat:    Irregular heart rhythm:        Vascular    Pain in calf, thigh, or hip brought on by ambulation:    Pain in feet at night that wakes you up from your sleep:     Blood clot in your veins:    Leg swelling:         Pulmonary    Oxygen at home:    Productive cough:     Wheezing:         Neurologic    Sudden weakness in arms or legs:     Sudden numbness in arms or legs:     Sudden onset of difficulty speaking or slurred speech:    Temporary loss of vision in one eye:     Problems with dizziness:         Gastrointestinal    Blood in stool:     Vomited blood:         Genitourinary    Burning when urinating:      Blood in urine:        Psychiatric    Major depression:         Hematologic    Bleeding problems:    Problems with blood clotting too easily:        Skin    Rashes or ulcers:        Constitutional    Fever or chills:      PHYSICAL EXAM: Vitals:   11/04/18 0829  BP: 125/90  Pulse: 90  Resp: 14  Temp: 98 F (36.7 C)  TempSrc: Temporal  SpO2: 97%  Weight: 154 lb (69.9 kg)  Height: 5' 1"  (1.549 m)    GENERAL: The patient is a well-nourished female, in no acute distress. The vital signs are documented above. CARDIAC: There is a regular rate and rhythm.  VASCULAR:  Palpable femoral pulses in both groins Brisk PT signals by doppler No tissue loss Midline and both groin incisions well healed PULMONARY: There is good air exchange bilaterally without wheezing or rales. ABDOMEN: Soft and non-tender with normal pitched bowel sounds.  No distension.  No significant pain.   DATA:   None  Assessment/Plan:  53 year old female presents for evaluation of abdominal pain after aortobifemoral bypass on 08/25/2018 for lifestyle limiting claudication left lower extremity with occluded iliac stent.  I think overall she is doing very well from aortobifemoral bypass standpoint.  All of her incisions are well-healed.   She has no symptoms consistent with ileus or bowel obstruction.  She has bounding femoral pulses in both groins and both of the limbs of her graft are doing fine.  She also has excellent posterior tibial signals at ankle with normal noninvasive imaging in June. I think her biggest issue at this time is she seems very stressed and overwhelmed taking care of her mom as well as fearing going back to work and fear her surgery could have a complication.  Discussed that she would probably benefit from seeing her primary care provider to be evaluated for maybe starting a medication to help with her anxiety since this is certainly not my area of expertise. I think she can go back to work  beginning of September and does not have any restrictions from my standpoint.  That being said discussed that if she is overly fatigued we can provide a note to have her start going back at half days until she gets her strength and stamina back.  I will keep her follow-up at 9 months as previously scheduled with ABIs.   Marty Heck, MD [facility] [place] Office: [phone] Pager: [place]

## 2018-11-05 DIAGNOSIS — F410 Panic disorder [episodic paroxysmal anxiety] without agoraphobia: Secondary | ICD-10-CM | POA: Diagnosis not present

## 2018-11-05 DIAGNOSIS — F411 Generalized anxiety disorder: Secondary | ICD-10-CM | POA: Diagnosis not present

## 2019-09-28 DIAGNOSIS — F411 Generalized anxiety disorder: Secondary | ICD-10-CM | POA: Diagnosis not present

## 2019-09-28 DIAGNOSIS — E782 Mixed hyperlipidemia: Secondary | ICD-10-CM | POA: Diagnosis not present

## 2019-09-28 DIAGNOSIS — E039 Hypothyroidism, unspecified: Secondary | ICD-10-CM | POA: Diagnosis not present

## 2019-09-28 DIAGNOSIS — F410 Panic disorder [episodic paroxysmal anxiety] without agoraphobia: Secondary | ICD-10-CM | POA: Diagnosis not present

## 2019-09-28 DIAGNOSIS — R7301 Impaired fasting glucose: Secondary | ICD-10-CM | POA: Diagnosis not present

## 2020-07-08 IMAGING — DX PORTABLE CHEST - 1 VIEW
1 series · 1 of 1 positions shown · non-contrast
Comparison: 01/27/2015

CLINICAL DATA: Status post aortobifemoral bypass graft

EXAM:
PORTABLE CHEST 1 VIEW

[chest ap]
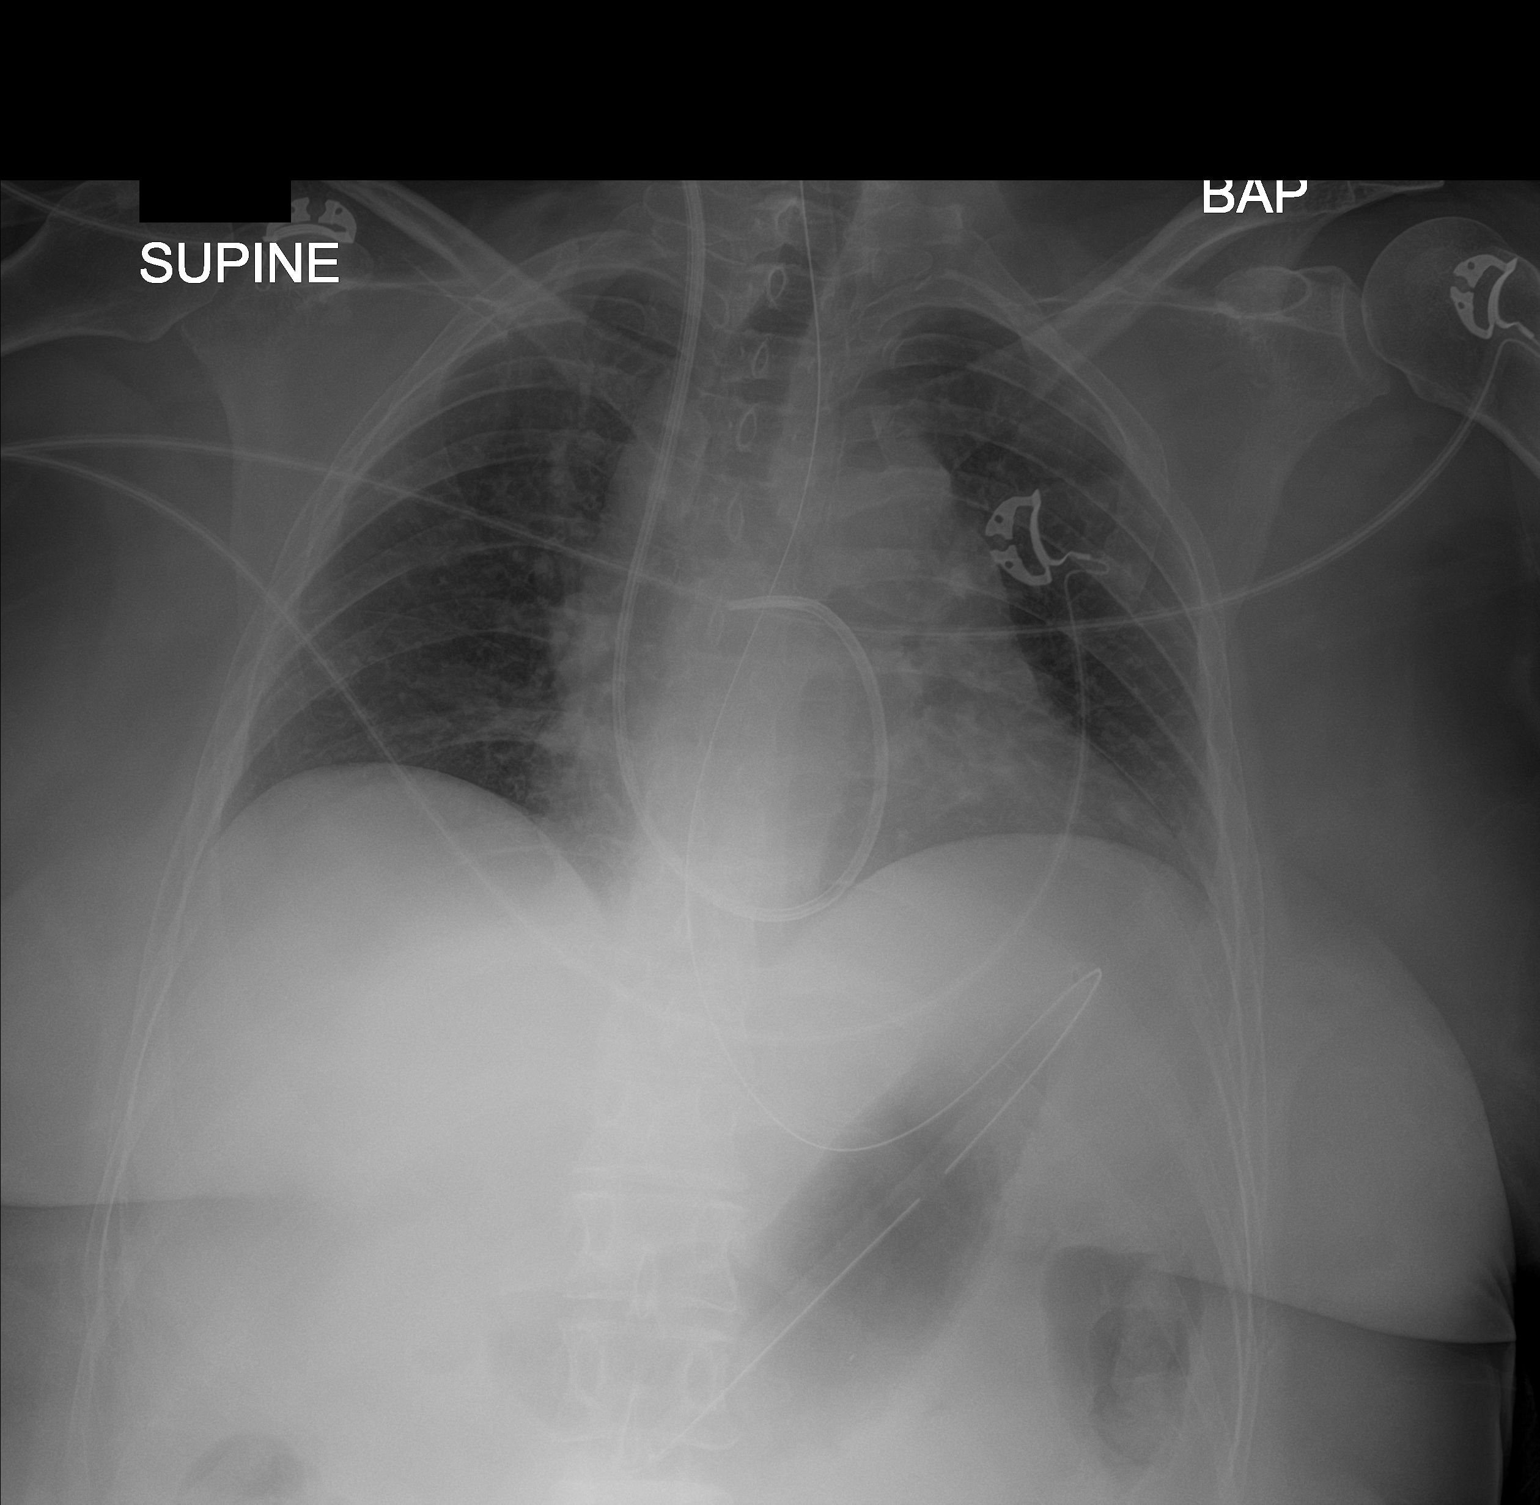

[1 of 1 positions shown; findings below may reference images not displayed]

FINDINGS: Cardiac shadow is accentuated by the frontal technique. Gastric
catheter is noted within the stomach. Swan-Ganz catheter is noted in
the right pulmonary outflow tract. Lungs are clear but hypoinflated.
No bony abnormality is noted.
IMPRESSION: Tubes and lines as described above.  No acute abnormality noted.

## 2020-07-08 IMAGING — CR OR LOCAL ABDOMEN
1 series · 1 of 1 positions shown · non-contrast
Comparison: CTA 06/13/2018

CLINICAL DATA: Postop aortobifemoral bypass graft. Incorrect
instrument count.

EXAM:
OR LOCAL ABDOMEN

[AP]
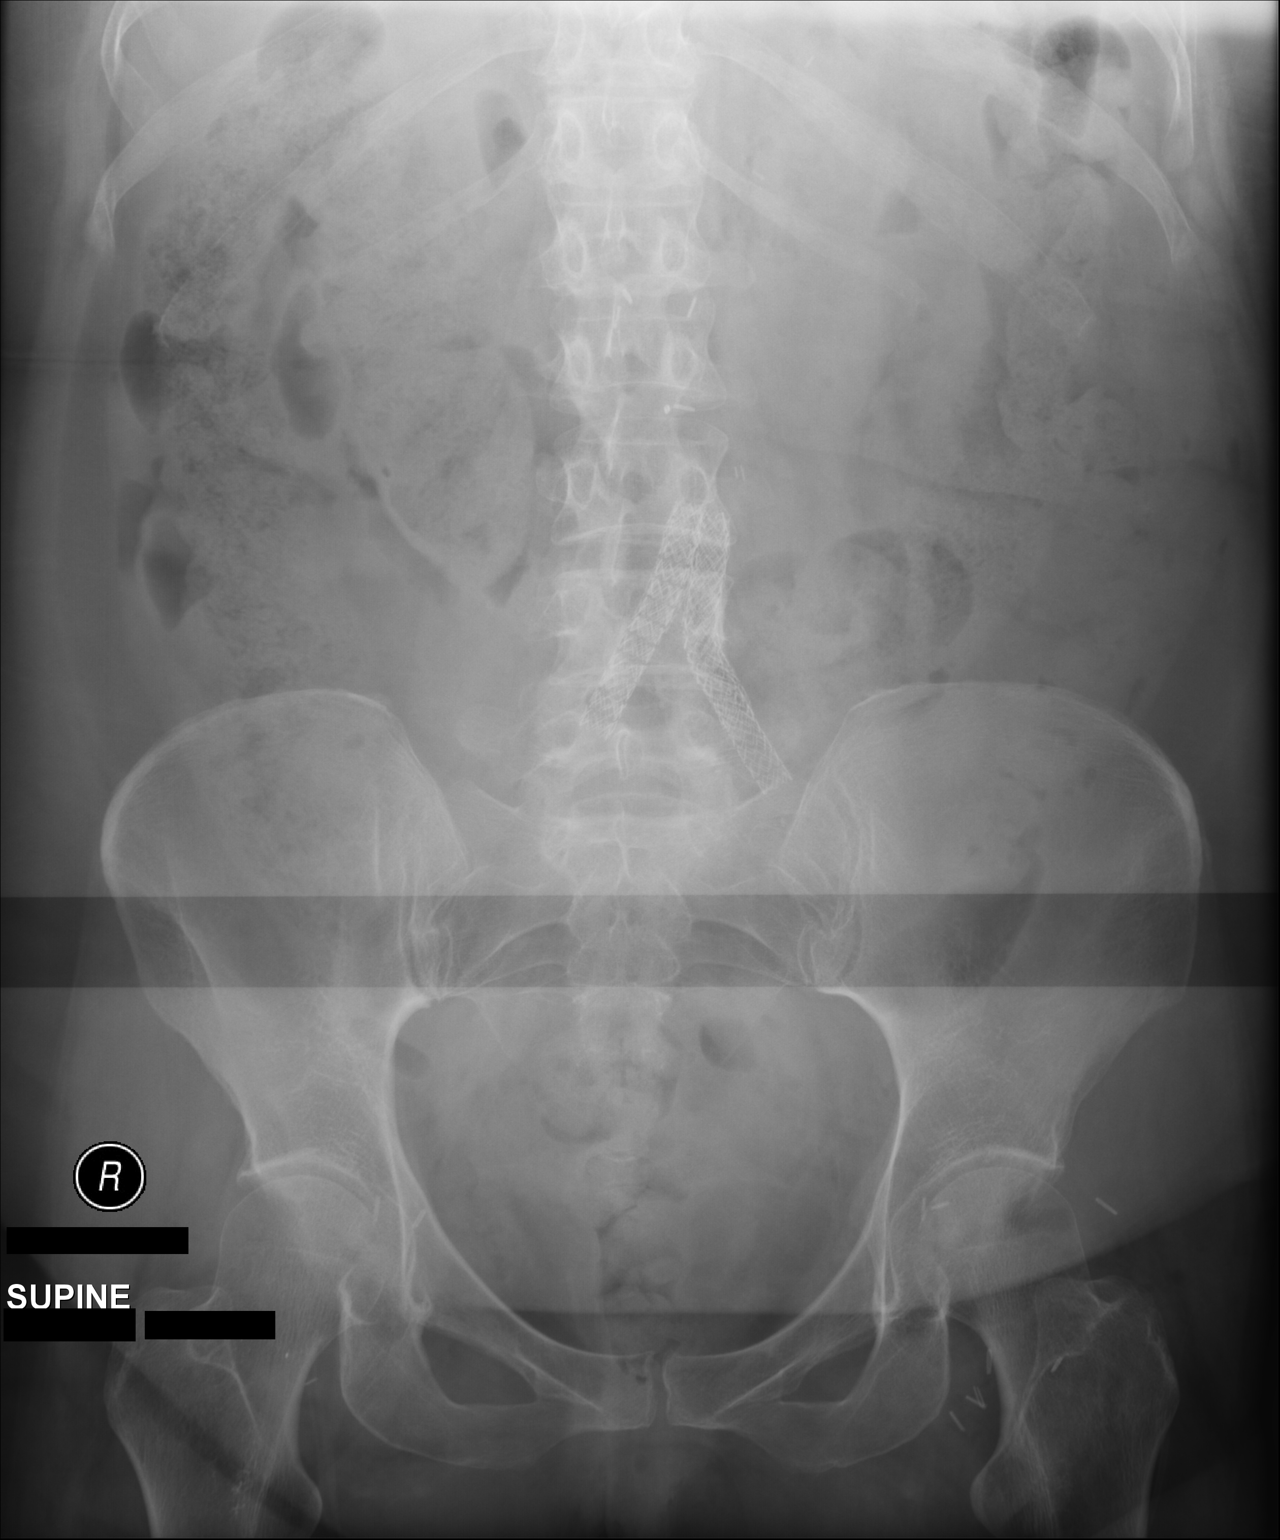

[1 of 1 positions shown; findings below may reference images not displayed]

FINDINGS: 3038 hours. Status post previous bilateral iliac stent grafting.
There are new surgical clips in both groins. No retained surgical
instrument or other foreign body identified. The visualized bowel
gas pattern is normal.
IMPRESSION: No evidence of retained surgical instrument or other unexpected
foreign body following aortobifemoral bypass grafting.

These results were called by telephone at the time of interpretation

## 2021-01-24 ENCOUNTER — Ambulatory Visit
Admission: EM | Admit: 2021-01-24 | Discharge: 2021-01-24 | Disposition: A | Discharge status: Home / Self Care | Payer: 59 | Attending: Family Medicine | Admitting: Family Medicine

## 2021-01-24 DIAGNOSIS — J069 Acute upper respiratory infection, unspecified: Secondary | ICD-10-CM

## 2021-01-24 DIAGNOSIS — R062 Wheezing: Secondary | ICD-10-CM

## 2021-01-24 DIAGNOSIS — Z1152 Encounter for screening for COVID-19: Secondary | ICD-10-CM

## 2021-01-24 MED ORDER — PREDNISONE 20 MG PO TABS: 40.0000 mg | ORAL_TABLET | Freq: Every day | ORAL | 0 refills | Status: DC

## 2021-01-24 MED ORDER — HYDROCODONE BIT-HOMATROP MBR 5-1.5 MG/5ML PO SOLN: 5.0000 mL | Freq: Four times a day (QID) | ORAL | 0 refills | Status: DC | PRN

## 2021-01-24 NOTE — ED Triage Notes (Signed)
Patient presents to Urgent Care with complaints of cough, headache, and chills since Friday. She states fever x 2 days has resolved. Treating symptoms OTC cold/cough and tylenol.

## 2021-01-24 NOTE — Discharge Instructions (Signed)
Be aware, your blood sugars will be elevated while taking prednisone. Monitor closely.  Also, your cough medication may cause drowsiness. Please do not drive, operate heavy machinery or make important decisions while on this medication, it can cloud your judgement.  You have been tested for COVID-19 today. If your test returns positive, you will receive a phone call from [facility] regarding your results. Negative test results are not called. Both positive and negative results area always visible on MyChart. If you do not have a MyChart account, sign up instructions are provided in your discharge papers. Please do not hesitate to contact Brianna Powers should you have questions or concerns.

## 2021-01-25 LAB — COVID-19, FLU A+B AND RSV
Influenza A, NAA: NOT DETECTED
Influenza B, NAA: NOT DETECTED
RSV, NAA: DETECTED — AB
SARS-CoV-2, NAA: NOT DETECTED

## 2021-01-25 NOTE — ED Provider Notes (Signed)
[facility]ARE [facility]ENTER   [id number] 01/24/21 Arrival Time: 0919  ASSESSMENT & PLAN:  1. Encounter for screening for COVID-19   2. Viral URI with cough   3. Wheezing    Discussed typical duration of viral illnesses. Viral testing sent. OTC symptom care as needed.  Meds ordered this encounter  Medications   HYDROcodone bit-homatropine (HYCODAN) 5-1.5 MG/5ML syrup    Sig: Take 5 mLs by mouth every 6 (six) hours as needed for cough.    Dispense:  90 mL    Refill:  0   predniSONE (DELTASONE) 20 MG tablet    Sig: Take 2 tablets (40 mg total) by mouth daily.    Dispense:  10 tablet    Refill:  0     Follow-up Information     Benita Stabile, MD.   Specialty: Internal Medicine Why: If worsening or failing to improve as anticipated. Contact information: [address] [place] [facility] [address] [phone]                [place] Controlled Substances Registry consulted for this patient. I feel the risk/benefit ratio today is favorable for proceeding with this prescription for a controlled substance. Medication sedation precautions given.  Reviewed expectations re: course of current medical issues. Questions answered. Outlined signs and symptoms indicating need for more acute intervention. Understanding verbalized. After Visit Summary given.   SUBJECTIVE: History from: patient. Brianna Powers is a 55 y.o. female who reports: cough, HA, chills. Abrupt onset 4-5 d ago. Denies: difficulty breathing. Normal PO intake without n/v/d.   OBJECTIVE:  Vitals:   01/24/21 1103  BP: 134/77  Pulse: 89  Resp: 16  Temp: 98.6 F (37 C)  TempSrc: Oral  SpO2: 98%    General appearance: alert; no distress Eyes: PERRLA; EOMI; conjunctiva normal HENT: [place]; AT; with nasal congestion Neck: supple  Lungs: speaks full sentences without difficulty; unlabored; mild exp wheezing Extremities: no edema Skin: warm and dry Neurologic: normal gait Psychological: alert and cooperative;  normal mood and affect  Labs:  Labs Reviewed  COVID-19, FLU A+B AND RSV     Allergies  Allergen Reactions   Amoxicillin Shortness Of Breath, Rash and Other (See Comments)    Panic attacks.     Past Medical History:  Diagnosis Date   Hypothyroidism    Peripheral vascular disease (HCC)    Social History   Socioeconomic History   Marital status: Married    Spouse name: Not on file   Number of children: Not on file   Years of education: Not on file   Highest education level: Not on file  Occupational History   Not on file  Tobacco Use   Smoking status: Former    Types: Cigarettes    Quit date: 05/25/2014    Years since quitting: 6.6   Smokeless tobacco: Never  Vaping Use   Vaping Use: Never used  Substance and Sexual Activity   Alcohol use: No    Alcohol/week: 0.0 standard drinks   Drug use: No   Sexual activity: Not on file  Other Topics Concern   Not on file  Social History Narrative   Not on file   Social Determinants of Health   Financial Resource Strain: Not on file  Food Insecurity: Not on file  Transportation Needs: Not on file  Physical Activity: Not on file  Stress: Not on file  Social Connections: Not on file  Intimate Partner Violence: Not on file   Family History  Problem Relation Age of Onset   CAD Father    Diabetes type II Father    Past Surgical History:  Procedure Laterality Date   ABDOMINAL AORTOGRAM N/A 05/02/2016   Procedure: Abdominal Aortogram;  Surgeon: Maeola Harman, MD;  Location: [facility] IN[facility];  Service: Cardiovascular;  Laterality: N/A;   ABDOMINAL AORTOGRAM W/LOWER EXTREMITY N/A 11/28/2017   Procedure: ABDOMINAL AORTOGRAM W/LOWER EXTREMITY;  Surgeon: Cephus Shelling, MD;  Location: [facility];  Service: Cardiovascular;  Laterality: N/A;  Bilatertal   AORTA - BILATERAL FEMORAL ARTERY BYPASS GRAFT Bilateral 08/25/2018   Procedure: AORTA BIFEMORAL BYPASS USING HEMASHIELD GOLD 14 x GRAFT;  Surgeon:  Cephus Shelling, MD;  Location: [facility] OR;  Service: Vascular;  Laterality: Bilateral;   FEMORAL ARTERY EXPLORATION Bilateral 01/28/2015   Procedure: BILATERAL FEMORAL ARTERY EXPLORATION;  Surgeon: Larina Earthly, MD;  Location: [facility] OR;  Service: Vascular;  Laterality: Bilateral;   INSERTION OF ILIAC STENT Bilateral 01/28/2015   Procedure: INSERTION OF RIGHT AND LEFT ILIAC STENT;  Surgeon: Larina Earthly, MD;  Location: [facility] OR;  Service: Vascular;  Laterality: Bilateral;   LOWER EXTREMITY ANGIOGRAPHY N/A 05/02/2016   Procedure: Bilateral Illac Stent;  Surgeon: Maeola Harman, MD;  Location: [facility] IN[facility]AS[facility];  Service: Cardiovascular;  Laterality: N/A;   PERIPHERAL VASCULAR CATHETERIZATION N/A 01/26/2015   Procedure: Abdominal Aortogram;  Surgeon: Larina Earthly, MD;  Location: [facility] INVAS[facility];  Service: Cardiovascular;  Laterality: N/A;   PERIPHERAL VASCULAR CATHETERIZATION N/A 04/19/2016   Procedure: Abdominal Aortogram w/Lower Extremity;  Surgeon: Fransisco Hertz, MD;  Location: [facility] INVASIVE [facility] LAB;  Service: Cardiovascular;  Laterality: N/A;   PERIPHERAL VASCULAR INTERVENTION Bilateral 11/28/2017   Procedure: PERIPHERAL VASCULAR INTERVENTION;  Surgeon: Cephus Shelling, MD;  Location: [facility];  Service: Cardiovascular;  Laterality: Bilateral;  Iliac     Mardella Layman, MD 01/25/21 0930

## 2021-04-13 ENCOUNTER — Ambulatory Visit: Payer: BC Managed Care – PPO | Admitting: Cardiology

## 2021-05-08 ENCOUNTER — Other Ambulatory Visit: Payer: Self-pay

## 2021-05-08 DIAGNOSIS — I7410 Embolism and thrombosis of unspecified parts of aorta: Secondary | ICD-10-CM

## 2021-05-16 ENCOUNTER — Encounter: Payer: Self-pay | Admitting: Vascular Surgery

## 2021-05-16 ENCOUNTER — Ambulatory Visit (INDEPENDENT_AMBULATORY_CARE_PROVIDER_SITE_OTHER): Payer: 59 | Admitting: Vascular Surgery

## 2021-05-16 ENCOUNTER — Ambulatory Visit (HOSPITAL_COMMUNITY)
Admission: RE | Admit: 2021-05-16 | Discharge: 2021-05-16 | Disposition: A | Discharge status: Home / Self Care | Payer: 59 | Source: Ambulatory Visit | Attending: Vascular Surgery | Admitting: Vascular Surgery

## 2021-05-16 ENCOUNTER — Other Ambulatory Visit: Payer: Self-pay

## 2021-05-16 VITALS — BP 141/78 | HR 80 | Temp 97.6°F | Resp 14 | Ht 61.0 in | Wt 169.0 lb

## 2021-05-16 DIAGNOSIS — I7409 Other arterial embolism and thrombosis of abdominal aorta: Secondary | ICD-10-CM | POA: Diagnosis not present

## 2021-05-16 DIAGNOSIS — I7410 Embolism and thrombosis of unspecified parts of aorta: Secondary | ICD-10-CM | POA: Diagnosis not present

## 2021-05-16 NOTE — Progress Notes (Signed)
Patient name: Brianna Powers MRN: [id number] DOB: 28-Nov-1965 Sex: female  REASON FOR VISIT: Follow-up after remote aortobifemoral bypass  HPI: Brianna Powers is a 55 y.o. female with hx PVD that presents for follow-up s/p aortobifemoral bypass on 08/25/18 for lifestyle limiting claudication with occluded left common iliac stents after redo x3.  States she has not had followup since 2020 and her PCP instructed follow-up.  She denies any smoking.  She is having no lower extremity complaints when she walks.  Overall is doing well.  Past Medical History:  Diagnosis Date   Hypothyroidism    Peripheral vascular disease ([place])     Past Surgical History:  Procedure Laterality Date   ABDOMINAL AORTOGRAM N/A 05/02/2016   Procedure: Abdominal Aortogram;  Surgeon: Waynetta Sandy, MD;  Location: [place] [facility];  Service: Cardiovascular;  Laterality: N/A;   ABDOMINAL AORTOGRAM W/LOWER EXTREMITY N/A 11/28/2017   Procedure: ABDOMINAL AORTOGRAM W/LOWER EXTREMITY;  Surgeon: Marty Heck, MD;  Location: [place] [facility];  Service: Cardiovascular;  Laterality: N/A;  Bilatertal   AORTA - BILATERAL FEMORAL ARTERY BYPASS GRAFT Bilateral 08/25/2018   Procedure: AORTA BIFEMORAL BYPASS USING HEMASHIELD GOLD 14 x 7MM GRAFT;  Surgeon: Marty Heck, MD;  Location: [place];  Service: Vascular;  Laterality: Bilateral;   FEMORAL ARTERY EXPLORATION Bilateral 01/28/2015   Procedure: BILATERAL FEMORAL ARTERY EXPLORATION;  Surgeon: Rosetta Posner, MD;  Location: [place];  Service: Vascular;  Laterality: Bilateral;   INSERTION OF ILIAC STENT Bilateral 01/28/2015   Procedure: INSERTION OF RIGHT AND LEFT ILIAC STENT;  Surgeon: Rosetta Posner, MD;  Location: [place];  Service: Vascular;  Laterality: Bilateral;   LOWER EXTREMITY ANGIOGRAPHY N/A 05/02/2016   Procedure: Bilateral Illac Stent;  Surgeon: Waynetta Sandy, MD;  Location: [place] [facility];  Service: Cardiovascular;  Laterality: N/A;    PERIPHERAL VASCULAR CATHETERIZATION N/A 01/26/2015   Procedure: Abdominal Aortogram;  Surgeon: Rosetta Posner, MD;  Location: [place] [facility];  Service: Cardiovascular;  Laterality: N/A;   PERIPHERAL VASCULAR CATHETERIZATION N/A 04/19/2016   Procedure: Abdominal Aortogram w/Lower Extremity;  Surgeon: Conrad Freestone, MD;  Location: [place] [facility];  Service: Cardiovascular;  Laterality: N/A;   PERIPHERAL VASCULAR INTERVENTION Bilateral 11/28/2017   Procedure: PERIPHERAL VASCULAR INTERVENTION;  Surgeon: Marty Heck, MD;  Location: [place] [facility];  Service: Cardiovascular;  Laterality: Bilateral;  Iliac    Family History  Problem Relation Age of Onset   CAD Father    Diabetes type II Father     SOCIAL HISTORY: Social History   Tobacco Use   Smoking status: Former    Types: Cigarettes    Quit date: 05/25/2014    Years since quitting: 6.9   Smokeless tobacco: Never  Substance Use Topics   Alcohol use: No    Alcohol/week: 0.0 standard drinks    Allergies  Allergen Reactions   Amoxicillin Shortness Of Breath, Rash and Other (See Comments)    Panic attacks.     Current Outpatient Medications  Medication Sig Dispense Refill   acetaminophen (TYLENOL) 500 MG tablet Take 1,000 mg by mouth 2 (two) times daily as needed for moderate pain.     aspirin EC 81 MG EC tablet Take 1 tablet (81 mg total) by mouth daily.     levothyroxine (SYNTHROID, LEVOTHROID) 75 MCG tablet Take 75 mcg by mouth daily before breakfast.     rosuvastatin (CRESTOR) 10 MG tablet Take 10 mg by mouth at  bedtime.      ACCU-CHEK AVIVA PLUS test strip TEST twice a day (Patient not taking: No sig reported) 50 each PRN   ACCU-CHEK SOFTCLIX LANCETS lancets TEST twice a day (Patient not taking: No sig reported) 100 each PRN   glucose monitoring kit (FREESTYLE) monitoring kit 1 each by Does not apply route as needed for other. Use as directed (Patient not taking: Reported on 11/04/2018) 1 each 0   HYDROcodone  bit-homatropine (HYCODAN) 5-1.5 MG/5ML syrup Take 5 mLs by mouth every 6 (six) hours as needed for cough. (Patient not taking: Reported on 05/16/2021) 90 mL 0   metFORMIN (GLUCOPHAGE-XR) 500 MG 24 hr tablet Take 500 mg by mouth daily. (Patient not taking: Reported on 05/16/2021)     predniSONE (DELTASONE) 20 MG tablet Take 2 tablets (40 mg total) by mouth daily. (Patient not taking: Reported on 05/16/2021) 10 tablet 0   No current facility-administered medications for this visit.    REVIEW OF SYSTEMS:  [X]  denotes positive finding, [ ]  denotes negative finding Cardiac  Comments:  Chest pain or chest pressure:    Shortness of breath upon exertion:    Short of breath when lying flat:    Irregular heart rhythm:        Vascular    Pain in calf, thigh, or hip brought on by ambulation:    Pain in feet at night that wakes you up from your sleep:     Blood clot in your veins:    Leg swelling:         Pulmonary    Oxygen at home:    Productive cough:     Wheezing:         Neurologic    Sudden weakness in arms or legs:     Sudden numbness in arms or legs:     Sudden onset of difficulty speaking or slurred speech:    Temporary loss of vision in one eye:     Problems with dizziness:         Gastrointestinal    Blood in stool:     Vomited blood:         Genitourinary    Burning when urinating:     Blood in urine:        Psychiatric    Major depression:         Hematologic    Bleeding problems:    Problems with blood clotting too easily:        Skin    Rashes or ulcers:        Constitutional    Fever or chills:      PHYSICAL EXAM: Vitals:   05/16/21 1601  BP: (!) 141/78  Pulse: 80  Resp: 14  Temp: 97.6 F (36.4 C)  TempSrc: Temporal  SpO2: 97%  Weight: 169 lb (76.7 kg)  Height: 5' 1"  (1.549 m)    GENERAL: The patient is a well-nourished female, in no acute distress. The vital signs are documented above. CARDIAC: There is a regular rate and rhythm.  VASCULAR:   Palpable femoral pulses bilaterally Palpable PT pulses bilaterally PULMONARY: No respiratory distress. ABDOMEN: Soft and non-tender.  Midline and both groin incisions well-healed. MUSCULOSKELETAL: There are no major deformities or cyanosis. NEUROLOGIC: No focal weakness or paresthesias are detected. SKIN: There are no ulcers or rashes noted. PSYCHIATRIC: The patient has a normal affect.  DATA:   ABIs today are 1.07 on the right triphasic and 1.18 on the left  Assessment/Plan:  56 year old female presents for follow-up after aortobifemoral bypass on 08/25/2018.  Discussed she has palpable femoral and PT pulses on exam.  Her ABIs are normal today with triphasic waveforms at the ankle.  She is having no lower extremity complaints.  Discussed aspirin statin from my standpoint and does not need Plavix for cardiovascular risk reduction.  We will have her follow-up in 2 years for a 5-year CT scan.  Discussed she call with questions or concerns.   Marty Heck, MD [facility] [place] Office: [phone]

## 2022-07-06 ENCOUNTER — Encounter: Payer: Self-pay | Admitting: Emergency Medicine

## 2022-07-06 ENCOUNTER — Other Ambulatory Visit: Payer: Self-pay

## 2022-07-06 ENCOUNTER — Ambulatory Visit
Admission: EM | Admit: 2022-07-06 | Discharge: 2022-07-06 | Disposition: A | Discharge status: Home / Self Care | Payer: 59 | Attending: Nurse Practitioner | Admitting: Nurse Practitioner

## 2022-07-06 DIAGNOSIS — K0889 Other specified disorders of teeth and supporting structures: Secondary | ICD-10-CM | POA: Diagnosis not present

## 2022-07-06 MED ORDER — CLINDAMYCIN HCL 150 MG PO CAPS: 450.0000 mg | ORAL_CAPSULE | Freq: Three times a day (TID) | ORAL | 0 refills | Status: AC

## 2022-07-06 MED ORDER — IBUPROFEN 800 MG PO TABS: 800.0000 mg | ORAL_TABLET | Freq: Three times a day (TID) | ORAL | 0 refills | Status: DC | PRN

## 2022-07-06 NOTE — ED Provider Notes (Signed)
[facility]    CSN: [id number] Arrival date & time: 07/06/22  1100      History   Chief Complaint Chief Complaint  Patient presents with   Dental Pain    HPI Brianna Powers is a 57 y.o. female.   Patient presents today for dental pain that has been ongoing for the past 4 days.  Reports that she has pain in the entire left side of her face including up into her sinuses.  Reports when she bites down, her teeth are very sensitive.  No fever, bodyaches or chills.  No recent cough, congestion, runny or stuffy nose.  No sore throat, abdominal pain, nausea/vomiting.  Reports she has tried to call her dentist as well as other dentists in the area and none were able to see her today.  Reports her primary care provider was also booked today.  Patient reports she is allergic to amoxicillin, reports she had anaphylactic like reaction after she took it.    Past Medical History:  Diagnosis Date   Hypothyroidism    Peripheral vascular disease     Patient Active Problem List   Diagnosis Date Noted   Chronic aorto-iliac occlusion syndrome 08/25/2018   Aortoiliac occlusive disease 08/25/2018   Atherosclerosis of native arteries of extremity with intermittent claudication 05/27/2018   Aortic thrombus 01/26/2015    Past Surgical History:  Procedure Laterality Date   ABDOMINAL AORTOGRAM N/A 05/02/2016   Procedure: Abdominal Aortogram;  Surgeon: Maeola Harman, MD;  Location: [facility] INVASIVE CV LAB;  Service: Cardiovascular;  Laterality: N/A;   ABDOMINAL AORTOGRAM W/LOWER EXTREMITY N/A 11/28/2017   Procedure: ABDOMINAL AORTOGRAM W/LOWER EXTREMITY;  Surgeon: Cephus Shelling, MD;  Location: [facility] INVASIVE CV LAB;  Service: Cardiovascular;  Laterality: N/A;  Bilatertal   AORTA - BILATERAL FEMORAL ARTERY BYPASS GRAFT Bilateral 08/25/2018   Procedure: AORTA BIFEMORAL BYPASS USING HEMASHIELD GOLD 14 x GRAFT;  Surgeon: Cephus Shelling, MD;  Location: [facility] OR;  Service:  Vascular;  Laterality: Bilateral;   FEMORAL ARTERY EXPLORATION Bilateral 01/28/2015   Procedure: BILATERAL FEMORAL ARTERY EXPLORATION;  Surgeon: Larina Earthly, MD;  Location: [facility] OR;  Service: Vascular;  Laterality: Bilateral;   INSERTION OF ILIAC STENT Bilateral 01/28/2015   Procedure: INSERTION OF RIGHT AND LEFT ILIAC STENT;  Surgeon: Larina Earthly, MD;  Location: [facility] OR;  Service: Vascular;  Laterality: Bilateral;   LOWER EXTREMITY ANGIOGRAPHY N/A 05/02/2016   Procedure: Bilateral Illac Stent;  Surgeon: Maeola Harman, MD;  Location: [facility] INVASIVE CV LAB;  Service: Cardiovascular;  Laterality: N/A;   PERIPHERAL VASCULAR CATHETERIZATION N/A 01/26/2015   Procedure: Abdominal Aortogram;  Surgeon: Larina Earthly, MD;  Location: [facility] INVASIVE CV LAB;  Service: Cardiovascular;  Laterality: N/A;   PERIPHERAL VASCULAR CATHETERIZATION N/A 04/19/2016   Procedure: Abdominal Aortogram w/Lower Extremity;  Surgeon: Fransisco Hertz, MD;  Location: [facility] INVASIVE CV LAB;  Service: Cardiovascular;  Laterality: N/A;   PERIPHERAL VASCULAR INTERVENTION Bilateral 11/28/2017   Procedure: PERIPHERAL VASCULAR INTERVENTION;  Surgeon: Cephus Shelling, MD;  Location: [facility] INVASIVE CV LAB;  Service: Cardiovascular;  Laterality: Bilateral;  Iliac    OB History   No obstetric history on file.      Home Medications    Prior to Admission medications   Medication Sig Start Date End Date Taking? Authorizing Provider  clindamycin (CLEOCIN) 150 MG capsule Take 3 capsules (450 mg total) by mouth 3 (three) times daily for 7 days. 07/06/22 07/13/22 Yes Valentino Nose, NP  ibuprofen (ADVIL) 800 MG tablet Take 1 tablet (800 mg total) by mouth every 8 (eight) hours as needed. Take with food to prevent GI upset 07/06/22  Yes Valentino Nose, NP  ACCU-CHEK AVIVA PLUS test strip TEST twice a day Patient not taking: No sig reported 11/08/12   Lazaro Arms, MD  ACCU-CHEK SOFTCLIX LANCETS lancets TEST twice a day Patient not taking:  No sig reported 11/08/12   Lazaro Arms, MD  acetaminophen (TYLENOL) 500 MG tablet Take 1,000 mg by mouth 2 (two) times daily as needed for moderate pain.    [provider]  aspirin EC 81 MG EC tablet Take 1 tablet (81 mg total) by mouth daily. 08/30/18   Emilie Rutter, PA-C  glucose monitoring kit (FREESTYLE) monitoring kit 1 each by Does not apply route as needed for other. Use as directed Patient not taking: Reported on 11/04/2018 10/08/12   Lazaro Arms, MD  levothyroxine (SYNTHROID, LEVOTHROID) 75 MCG tablet Take 75 mcg by mouth daily before breakfast.    [provider]  metFORMIN (GLUCOPHAGE-XR) 500 MG 24 hr tablet Take 500 mg by mouth daily. Patient not taking: Reported on 05/16/2021 07/25/18   [provider]  rosuvastatin (CRESTOR) 10 MG tablet Take 10 mg by mouth at bedtime.     [provider]    Family History Family History  Problem Relation Age of Onset   CAD Father    Diabetes type II Father     Social History Social History   Tobacco Use   Smoking status: Former    Types: Cigarettes    Quit date: 05/25/2014    Years since quitting: 8.1   Smokeless tobacco: Never  Vaping Use   Vaping Use: Never used  Substance Use Topics   Alcohol use: No    Alcohol/week: 0.0 standard drinks of alcohol   Drug use: No     Allergies   Amoxicillin   Review of Systems Review of Systems Per HPI  Physical Exam Triage Vital Signs ED Triage Vitals [07/06/22 1207]  Enc Vitals Group     BP (!) 162/96     Pulse Rate 84     Resp 20     Temp 97.9 F (36.6 C)     Temp Source Oral     SpO2 97 %     Weight      Height      Head Circumference      Peak Flow      Pain Score      Pain Loc      Pain Edu?      Excl. in GC?    No data found.  Updated Vital Signs BP (!) 162/96 (BP Location: Right Arm)   Pulse 84   Temp 97.9 F (36.6 C) (Oral)   Resp 20   SpO2 97%   Visual Acuity Right Eye Distance:   Left Eye Distance:   Bilateral  Distance:    Right Eye Near:   Left Eye Near:    Bilateral Near:     Physical Exam Vitals and nursing note reviewed.  Constitutional:      General: She is not in acute distress.    Appearance: Normal appearance. She is not toxic-appearing.  HENT:     Head: Normocephalic and atraumatic.     Nose: Nose normal. No congestion or rhinorrhea.     Mouth/Throat:     Mouth: Mucous membranes are moist.     Dentition: Abnormal  dentition. Dental tenderness and dental caries present. No gingival swelling or dental abscesses.     Pharynx: Oropharynx is clear.      Comments: Dental tenderness to area marked; mild erythema.  No obvious swelling; no drainage.  Eyes:     General: No scleral icterus.    Extraocular Movements: Extraocular movements intact.  Pulmonary:     Effort: Pulmonary effort is normal. No respiratory distress.  Musculoskeletal:     Cervical back: Normal range of motion.  Lymphadenopathy:     Cervical: No cervical adenopathy.  Skin:    General: Skin is warm and dry.     Capillary Refill: Capillary refill takes less than 2 seconds.     Coloration: Skin is not jaundiced or pale.     Findings: No erythema.  Neurological:     Mental Status: She is alert and oriented to person, place, and time.  Psychiatric:        Behavior: Behavior is cooperative.      UC Treatments / Results  Labs (all labs ordered are listed, but only abnormal results are displayed) Labs Reviewed - No data to display  EKG   Radiology No results found.  Procedures Procedures (including critical care time)  Medications Ordered in UC Medications - No data to display  Initial Impression / Assessment and Plan / UC Course  I have reviewed the triage vital signs and the nursing notes.  Pertinent labs & imaging results that were available during my care of the patient were reviewed by me and considered in my medical decision making (see chart for details).   Patient is well-appearing, afebrile,  not tachycardic, not tachypneic, oxygenating well on room air.  Patient is mildly hypertensive today, otherwise vital signs are stable.  1. Dentalgia Suspect dental infection Will cover with clindamycin 4 to 50 mg 3 times daily for 7 days Start alternating Tylenol 500 to 1000 mg every 6 hours with ibuprofen 800 mg every 8 hours as needed for pain Patient declines Toradol injection today for pain control Recommended close follow-up with dentist Note given for work  The patient was given the opportunity to ask questions.  All questions answered to their satisfaction.  The patient is in agreement to this plan.    Final Clinical Impressions(s) / UC Diagnoses   Final diagnoses:  Dentalgia     Discharge Instructions      Please take the clindamycin (antibiotic) as prescribed for a likely dental infection.  You can also take Tylenol 500 to 1000 mg every 6 hours as needed for pain along with ibuprofen 800 mg every 8 hours as needed for pain.  Please follow-up with a dentist early next week.   ED Prescriptions     Medication Sig Dispense Auth. Provider   clindamycin (CLEOCIN) 150 MG capsule Take 3 capsules (450 mg total) by mouth 3 (three) times daily for 7 days. 63 capsule Cathlean Marseilles A, NP   ibuprofen (ADVIL) 800 MG tablet Take 1 tablet (800 mg total) by mouth every 8 (eight) hours as needed. Take with food to prevent GI upset 21 tablet Valentino Nose, NP      PDMP not reviewed this encounter.   Valentino Nose, NP 07/06/22 1241

## 2022-07-06 NOTE — Discharge Instructions (Signed)
Please take the clindamycin (antibiotic) as prescribed for a likely dental infection.  You can also take Tylenol 500 to 1000 mg every 6 hours as needed for pain along with ibuprofen 800 mg every 8 hours as needed for pain.  Please follow-up with a dentist early next week.

## 2022-07-06 NOTE — ED Triage Notes (Signed)
Pt reports left sided dental pain since last night. Pt unsure if sinuses also involved and reports left sided facial pain, headache. Denies any known injury and reports dentist is out of town and reports pcp was booked today.

## 2023-06-14 ENCOUNTER — Ambulatory Visit
Admission: EM | Admit: 2023-06-14 | Discharge: 2023-06-14 | Disposition: A | Discharge status: Home / Self Care | Attending: Family Medicine | Admitting: Family Medicine

## 2023-06-14 DIAGNOSIS — M25562 Pain in left knee: Secondary | ICD-10-CM | POA: Diagnosis not present

## 2023-06-14 MED ORDER — PREDNISONE 20 MG PO TABS: 40.0000 mg | ORAL_TABLET | Freq: Every day | ORAL | 0 refills | Status: DC

## 2023-06-14 MED ORDER — MELOXICAM 15 MG PO TABS: 15.0000 mg | ORAL_TABLET | Freq: Every day | ORAL | 0 refills | Status: DC

## 2023-06-14 NOTE — ED Provider Notes (Signed)
 [facility]   [id number] 06/14/23 Arrival Time: 1023  ASSESSMENT & PLAN:  1. Acute pain of left knee    No concern for DVT at this time Begin trial of: Discharge Medication List as of 06/14/2023 11:02 AM     START taking these medications   Details  meloxicam (MOBIC) 15 MG tablet Take 1 tablet (15 mg total) by mouth daily., Starting Fri 06/14/2023, Normal    predniSONE (DELTASONE) 20 MG tablet Take 2 tablets (40 mg total) by mouth daily., Starting Fri 06/14/2023, Normal       Activities as tolerated.  Recommend:  Follow-up Information     Benita Stabile, MD.   Specialty: Internal Medicine Why: As needed. Contact information: [address] [place] [address] [phone]         [facility].   Specialty: Urgent Care Why: If worsening or failing to improve as anticipated. Contact information: [address], [address] [place] [address] [phone]                Reviewed expectations re: course of current medical issues. Questions answered. Outlined signs and symptoms indicating need for more acute intervention. Patient verbalized understanding. After Visit Summary given.  SUBJECTIVE: History from: patient. Brianna Powers is a 58 y.o. female who reports L knee injury; sev days go; sore since; mild L lower leg/knee swelling that is better in am; worse in pm. No extremity sensation changes or weakness. Ambulatory without difficulty. No tx PTA.   Past Surgical History:  Procedure Laterality Date   ABDOMINAL AORTOGRAM N/A 05/02/2016   Procedure: Abdominal Aortogram;  Surgeon: Maeola Harman, MD;  Location: [facility] INVASIVE CV LAB;  Service: Cardiovascular;  Laterality: N/A;   ABDOMINAL AORTOGRAM W/LOWER EXTREMITY N/A 11/28/2017   Procedure: ABDOMINAL AORTOGRAM W/LOWER EXTREMITY;  Surgeon: Cephus Shelling, MD;  Location: [facility] INVASIVE CV LAB;  Service: Cardiovascular;  Laterality: N/A;   Bilatertal   AORTA - BILATERAL FEMORAL ARTERY BYPASS GRAFT Bilateral 08/25/2018   Procedure: AORTA BIFEMORAL BYPASS USING HEMASHIELD GOLD 14 x GRAFT;  Surgeon: Cephus Shelling, MD;  Location: [facility] OR;  Service: Vascular;  Laterality: Bilateral;   FEMORAL ARTERY EXPLORATION Bilateral 01/28/2015   Procedure: BILATERAL FEMORAL ARTERY EXPLORATION;  Surgeon: Larina Earthly, MD;  Location: [facility] OR;  Service: Vascular;  Laterality: Bilateral;   INSERTION OF ILIAC STENT Bilateral 01/28/2015   Procedure: INSERTION OF RIGHT AND LEFT ILIAC STENT;  Surgeon: Larina Earthly, MD;  Location: [facility] OR;  Service: Vascular;  Laterality: Bilateral;   LOWER EXTREMITY ANGIOGRAPHY N/A 05/02/2016   Procedure: Bilateral Illac Stent;  Surgeon: Maeola Harman, MD;  Location: [facility] INVASIVE CV LAB;  Service: Cardiovascular;  Laterality: N/A;   PERIPHERAL VASCULAR CATHETERIZATION N/A 01/26/2015   Procedure: Abdominal Aortogram;  Surgeon: Larina Earthly, MD;  Location: [facility] INVASIVE CV LAB;  Service: Cardiovascular;  Laterality: N/A;   PERIPHERAL VASCULAR CATHETERIZATION N/A 04/19/2016   Procedure: Abdominal Aortogram w/Lower Extremity;  Surgeon: Fransisco Hertz, MD;  Location: [facility] INVASIVE CV LAB;  Service: Cardiovascular;  Laterality: N/A;   PERIPHERAL VASCULAR INTERVENTION Bilateral 11/28/2017   Procedure: PERIPHERAL VASCULAR INTERVENTION;  Surgeon: Cephus Shelling, MD;  Location: [facility] INVASIVE CV LAB;  Service: Cardiovascular;  Laterality: Bilateral;  Iliac      OBJECTIVE:  Vitals:   06/14/23 1027  BP: (!) 154/92  Pulse: 94  Resp: 18  Temp: (!) 97.4 F (36.3 C)  TempSrc: Oral  SpO2: 98%    General appearance: alert; no distress HEENT: [place]; AT Neck: supple with FROM Resp: unlabored respirations Extremities: LLE: warm with well perfused appearance; fairly well localized mild tenderness over left anterior knee; without gross deformities; swelling: minimal; bruising: none; knee ROM: normal; without calf swelling CV:  brisk extremity capillary refill of LLE; 2+ DP pulse of LLE. Skin: warm and dry; no visible rashes Neurologic: gait normal; normal sensation and strength of LLE Psychological: alert and cooperative; normal mood and affect   Allergies  Allergen Reactions   Amoxicillin Shortness Of Breath, Rash and Other (See Comments)    Panic attacks.     Past Medical History:  Diagnosis Date   Hypothyroidism    Peripheral vascular disease (HCC)    Social History   Socioeconomic History   Marital status: Married    Spouse name: Not on file   Number of children: Not on file   Years of education: Not on file   Highest education level: Not on file  Occupational History   Not on file  Tobacco Use   Smoking status: Former    Current packs/day: 0.00    Types: Cigarettes    Quit date: 05/25/2014    Years since quitting: 9.0   Smokeless tobacco: Never  Vaping Use   Vaping status: Never Used  Substance and Sexual Activity   Alcohol use: No    Alcohol/week: 0.0 standard drinks of alcohol   Drug use: No   Sexual activity: Not on file  Other Topics Concern   Not on file  Social History Narrative   Not on file   Social Drivers of Health   Financial Resource Strain: Not on file  Food Insecurity: Not on file  Transportation Needs: Not on file  Physical Activity: Not on file  Stress: Not on file  Social Connections: Not on file   Family History  Problem Relation Age of Onset   CAD Father    Diabetes type II Father    Past Surgical History:  Procedure Laterality Date   ABDOMINAL AORTOGRAM N/A 05/02/2016   Procedure: Abdominal Aortogram;  Surgeon: Maeola Harman, MD;  Location: [facility] INVASIVE CV LAB;  Service: Cardiovascular;  Laterality: N/A;   ABDOMINAL AORTOGRAM W/LOWER EXTREMITY N/A 11/28/2017   Procedure: ABDOMINAL AORTOGRAM W/LOWER EXTREMITY;  Surgeon: Cephus Shelling, MD;  Location: [facility] INVASIVE CV LAB;  Service: Cardiovascular;  Laterality: N/A;  Bilatertal   AORTA -  BILATERAL FEMORAL ARTERY BYPASS GRAFT Bilateral 08/25/2018   Procedure: AORTA BIFEMORAL BYPASS USING HEMASHIELD GOLD 14 x GRAFT;  Surgeon: Cephus Shelling, MD;  Location: [facility] OR;  Service: Vascular;  Laterality: Bilateral;   FEMORAL ARTERY EXPLORATION Bilateral 01/28/2015   Procedure: BILATERAL FEMORAL ARTERY EXPLORATION;  Surgeon: Larina Earthly, MD;  Location: [facility] OR;  Service: Vascular;  Laterality: Bilateral;   INSERTION OF ILIAC STENT Bilateral 01/28/2015   Procedure: INSERTION OF RIGHT AND LEFT ILIAC STENT;  Surgeon: Larina Earthly, MD;  Location: [facility] OR;  Service: Vascular;  Laterality: Bilateral;   LOWER EXTREMITY ANGIOGRAPHY N/A 05/02/2016   Procedure: Bilateral Illac Stent;  Surgeon: Maeola Harman, MD;  Location: [facility] INVASIVE CV LAB;  Service: Cardiovascular;  Laterality: N/A;   PERIPHERAL VASCULAR CATHETERIZATION N/A 01/26/2015   Procedure: Abdominal Aortogram;  Surgeon: Larina Earthly, MD;  Location: [facility] INVASIVE CV LAB;  Service: Cardiovascular;  Laterality: N/A;   PERIPHERAL VASCULAR CATHETERIZATION N/A 04/19/2016   Procedure: Abdominal Aortogram w/Lower Extremity;  Surgeon: Fransisco Hertz,  MD;  Location: [facility] INVASIVE CV LAB;  Service: Cardiovascular;  Laterality: N/A;   PERIPHERAL VASCULAR INTERVENTION Bilateral 11/28/2017   Procedure: PERIPHERAL VASCULAR INTERVENTION;  Surgeon: Cephus Shelling, MD;  Location: [facility] INVASIVE CV LAB;  Service: Cardiovascular;  Laterality: Bilateral;  Iliac       Mardella Layman, MD 06/14/23 1326

## 2023-06-14 NOTE — Discharge Instructions (Signed)
 Be aware, your blood sugars will run higher than normal while taking prednisone. Monitor closely.

## 2023-06-14 NOTE — ED Triage Notes (Signed)
 Pt reports she has some left knee down to her foot are painful and swollen x 3 weeks

## 2023-08-13 ENCOUNTER — Other Ambulatory Visit: Payer: Self-pay

## 2023-08-13 DIAGNOSIS — I70212 Atherosclerosis of native arteries of extremities with intermittent claudication, left leg: Secondary | ICD-10-CM

## 2023-08-23 ENCOUNTER — Ambulatory Visit (HOSPITAL_COMMUNITY)
Admission: RE | Admit: 2023-08-23 | Discharge: 2023-08-23 | Disposition: A | Discharge status: Home / Self Care | Source: Ambulatory Visit | Attending: Vascular Surgery | Admitting: Vascular Surgery

## 2023-08-23 DIAGNOSIS — I70212 Atherosclerosis of native arteries of extremities with intermittent claudication, left leg: Secondary | ICD-10-CM | POA: Diagnosis present

## 2023-08-23 LAB — VAS US ABI WITH/WO TBI
Left ABI: 0.89
Right ABI: 1

## 2023-08-27 ENCOUNTER — Encounter: Payer: Self-pay | Admitting: Vascular Surgery

## 2023-08-27 ENCOUNTER — Ambulatory Visit (INDEPENDENT_AMBULATORY_CARE_PROVIDER_SITE_OTHER): Admitting: Vascular Surgery

## 2023-08-27 VITALS — BP 141/85 | HR 88 | Ht 60.0 in | Wt 169.0 lb

## 2023-08-27 DIAGNOSIS — Z95828 Presence of other vascular implants and grafts: Secondary | ICD-10-CM

## 2023-08-27 NOTE — Progress Notes (Signed)
 Patient name: Brianna Powers MRN: [id number] DOB: 09-Feb-1966 Sex: female  REASON FOR VISIT: 2-year follow-up status post aortobifemoral bypass  HPI: Brianna Powers is a 58 y.o. female that presents for 2-year follow-up status post aortobifemoral bypass.  Previously had occluded iliac stents and underwent aortobifemoral bypass on 08/25/2018 for lifestyle limiting claudication.  States she has no pain in her legs when walking.  Concerned about swelling in her left leg as well as pain around the ankle and knee.  She has been evaluated by her primary care doctor and states ibuprofen  is helpful.  Past Medical History:  Diagnosis Date   Hypothyroidism    Peripheral vascular disease (HCC)     Past Surgical History:  Procedure Laterality Date   ABDOMINAL AORTOGRAM N/A 05/02/2016   Procedure: Abdominal Aortogram;  Surgeon: Adine Hoof, MD;  Location: [facility] INVASIVE CV LAB;  Service: Cardiovascular;  Laterality: N/A;   ABDOMINAL AORTOGRAM W/LOWER EXTREMITY N/A 11/28/2017   Procedure: ABDOMINAL AORTOGRAM W/LOWER EXTREMITY;  Surgeon: Young Hensen, MD;  Location: [facility] INVASIVE CV LAB;  Service: Cardiovascular;  Laterality: N/A;  Bilatertal   AORTA - BILATERAL FEMORAL ARTERY BYPASS GRAFT Bilateral 08/25/2018   Procedure: AORTA BIFEMORAL BYPASS USING HEMASHIELD GOLD 14 x GRAFT;  Surgeon: Young Hensen, MD;  Location: [facility] OR;  Service: Vascular;  Laterality: Bilateral;   FEMORAL ARTERY EXPLORATION Bilateral 01/28/2015   Procedure: BILATERAL FEMORAL ARTERY EXPLORATION;  Surgeon: Mayo Speck, MD;  Location: [facility] OR;  Service: Vascular;  Laterality: Bilateral;   INSERTION OF ILIAC STENT Bilateral 01/28/2015   Procedure: INSERTION OF RIGHT AND LEFT ILIAC STENT;  Surgeon: Mayo Speck, MD;  Location[facility] OR;  Service: Vascular;  Laterality: Bilateral;   LOWER EXTREMITY ANGIOGRAPHY N/A 05/02/2016   Procedure: Bilateral Illac Stent;  Surgeon: Adine Hoof, MD;  Location: [facility]  INVASIVE CV LAB;  Service: Cardiovascular;  Laterality: N/A;   PERIPHERAL VASCULAR CATHETERIZATION N/A 01/26/2015   Procedure: Abdominal Aortogram;  Surgeon: Mayo Speck, MD;  Location: [facility] INVASIVE CV LAB;  Service: Cardiovascular;  Laterality: N/A;   PERIPHERAL VASCULAR CATHETERIZATION N/A 04/19/2016   Procedure: Abdominal Aortogram w/Lower Extremity;  Surgeon: Arvil Lauber, MD;  Location: [facility] INVASIVE CV LAB;  Service: Cardiovascular;  Laterality: N/A;   PERIPHERAL VASCULAR INTERVENTION Bilateral 11/28/2017   Procedure: PERIPHERAL VASCULAR INTERVENTION;  Surgeon: Young Hensen, MD;  Location: [facility] INVASIVE CV LAB;  Service: Cardiovascular;  Laterality: Bilateral;  Iliac    Family History  Problem Relation Age of Onset   CAD Father    Diabetes type II Father     SOCIAL HISTORY: Social History   Tobacco Use   Smoking status: Former    Current packs/day: 0.00    Types: Cigarettes    Quit date: 05/25/2014    Years since quitting: 9.2   Smokeless tobacco: Never  Substance Use Topics   Alcohol use: No    Alcohol/week: 0.0 standard drinks of alcohol    Allergies  Allergen Reactions   Amoxicillin Shortness Of Breath, Rash and Other (See Comments)    Panic attacks.     Current Outpatient Medications  Medication Sig Dispense Refill   ACCU-CHEK AVIVA PLUS test strip TEST twice a day (Patient not taking: No sig reported) 50 each PRN   ACCU-CHEK SOFTCLIX LANCETS lancets TEST twice a day (Patient not taking: No sig reported) 100 each PRN   acetaminophen  (TYLENOL ) 500 MG tablet Take 1,000 mg by mouth 2 (two) times daily as  needed for moderate pain.     aspirin  EC 81 MG EC tablet Take 1 tablet (81 mg total) by mouth daily.     glucose monitoring kit (FREESTYLE) monitoring kit 1 each by Does not apply route as needed for other. Use as directed (Patient not taking: Reported on 11/04/2018) 1 each 0   ibuprofen  (ADVIL ) 800 MG tablet Take 1 tablet (800 mg total) by mouth every 8 (eight) hours  as needed. Take with food to prevent GI upset 21 tablet 0   levothyroxine  (SYNTHROID , LEVOTHROID) 75 MCG tablet Take 75 mcg by mouth daily before breakfast.     meloxicam  (MOBIC ) 15 MG tablet Take 1 tablet (15 mg total) by mouth daily. 14 tablet 0   predniSONE  (DELTASONE ) 20 MG tablet Take 2 tablets (40 mg total) by mouth daily. 10 tablet 0   rosuvastatin  (CRESTOR ) 10 MG tablet Take 10 mg by mouth at bedtime.      No current facility-administered medications for this visit.    REVIEW OF SYSTEMS:  [X]  denotes positive finding, [ ]  denotes negative finding Cardiac  Comments:  Chest pain or chest pressure:    Shortness of breath upon exertion:    Short of breath when lying flat:    Irregular heart rhythm:        Vascular    Pain in calf, thigh, or hip brought on by ambulation:    Pain in feet at night that wakes you up from your sleep:     Blood clot in your veins:    Leg swelling:  x left      Pulmonary    Oxygen at home:    Productive cough:     Wheezing:         Neurologic    Sudden weakness in arms or legs:     Sudden numbness in arms or legs:     Sudden onset of difficulty speaking or slurred speech:    Temporary loss of vision in one eye:     Problems with dizziness:         Gastrointestinal    Blood in stool:     Vomited blood:         Genitourinary    Burning when urinating:     Blood in urine:        Psychiatric    Major depression:         Hematologic    Bleeding problems:    Problems with blood clotting too easily:        Skin    Rashes or ulcers:        Constitutional    Fever or chills:      PHYSICAL EXAM: There were no vitals filed for this visit.  GENERAL: The patient is a well-nourished female, in no acute distress. The vital signs are documented above. CARDIAC: There is a regular rate and rhythm.  VASCULAR:  2+ palpable femoral pulses bilaterally with well-healed groin incisions Brisk multiphasic DP PT signals bilateral lower  extremities PULMONARY: No respiratory distress. ABDOMEN: Soft and non-tender.  Well healed midline incision. MUSCULOSKELETAL: There are no major deformities or cyanosis. NEUROLOGIC: No focal weakness or paresthesias are detected. SKIN: There are no ulcers or rashes noted. PSYCHIATRIC: The patient has a normal affect.  DATA:   Ultrasound aorta reviewed from 08/23/2023 but unclear that her aortobifemoral graft was studied  Assessment/Plan:  57 y.o. female that presents for 2-year follow-up status post aortobifemoral bypass.  Previously had occluded iliac  stents and underwent aortobifemoral bypass on 08/25/2018 for lifestyle limiting claudication.  Overall I offered reassurance today as she has great palpable femoral pulses.  She has brisk Doppler signals in her bilateral lower extremities.  Perfusion seems to be adequate.  I have recommended a CT abdomen pelvis given it has been 5 years since her aortobifemoral bypass to ensure there is no anastomotic pseudoaneurysm proximally and discussed this is for normal surveillance.  I will have her follow-up with me after the CT is complete.  Regarding her lower extremities swelling likely has some underlying chronic venous insufficiency.  I discussed leg elevation with exercise as well as compression stockings at least knee-high.   Young Hensen, MD [facility] Office: [phone]

## 2023-09-13 ENCOUNTER — Telehealth: Payer: Self-pay

## 2023-09-13 NOTE — Telephone Encounter (Signed)
 Patient called [facility] asking for an x-ray of her left knee and foot.  Pt stated that she has called [facility] several times and no one has returned her call.  There is no evidence that pt has called [facility] for her need.    Should patient call, [facility] would refer her to her PCP for the xray.  Rayann Cage [facility]   09/13/23 12:54 PM Note Patient called back spoke with patient she states she has called [facility] several times and no one has returned her call.  All she wants is a xray of her knee and foot I advised her to follow up with her pcp doctor.

## 2023-09-13 NOTE — Telephone Encounter (Signed)
 Patient left message about wanting an appointment , I returned her call and left message for her to call the office.

## 2023-09-13 NOTE — Telephone Encounter (Signed)
 Patient called back spoke with patient she states she has called [facility] several times and no one has returned her call.  All she wants is a xray of her knee and foot I advised her to follow up with her pcp doctor.

## 2023-10-13 ENCOUNTER — Emergency Department (HOSPITAL_COMMUNITY)

## 2023-10-13 ENCOUNTER — Encounter (HOSPITAL_COMMUNITY): Admission: EM | Disposition: E | Payer: Self-pay | Source: Home / Self Care | Attending: Vascular Surgery

## 2023-10-13 ENCOUNTER — Other Ambulatory Visit: Payer: Self-pay

## 2023-10-13 ENCOUNTER — Encounter (HOSPITAL_COMMUNITY): Payer: Self-pay | Admitting: *Deleted

## 2023-10-13 ENCOUNTER — Inpatient Hospital Stay (HOSPITAL_COMMUNITY)
Admission: EM | Admit: 2023-10-13 | Discharge: 2023-10-25 | DRG: 252 | Disposition: E | Attending: Internal Medicine | Admitting: Internal Medicine

## 2023-10-13 ENCOUNTER — Inpatient Hospital Stay (HOSPITAL_COMMUNITY): Admitting: Certified Registered Nurse Anesthetist

## 2023-10-13 DIAGNOSIS — I772 Rupture of artery: Secondary | ICD-10-CM | POA: Diagnosis present

## 2023-10-13 DIAGNOSIS — T827XXA Infection and inflammatory reaction due to other cardiac and vascular devices, implants and grafts, initial encounter: Principal | ICD-10-CM | POA: Diagnosis present

## 2023-10-13 DIAGNOSIS — Z515 Encounter for palliative care: Secondary | ICD-10-CM

## 2023-10-13 DIAGNOSIS — I70222 Atherosclerosis of native arteries of extremities with rest pain, left leg: Secondary | ICD-10-CM | POA: Diagnosis present

## 2023-10-13 DIAGNOSIS — Z79890 Hormone replacement therapy (postmenopausal): Secondary | ICD-10-CM

## 2023-10-13 DIAGNOSIS — E785 Hyperlipidemia, unspecified: Secondary | ICD-10-CM | POA: Diagnosis present

## 2023-10-13 DIAGNOSIS — Z880 Allergy status to penicillin: Secondary | ICD-10-CM | POA: Diagnosis not present

## 2023-10-13 DIAGNOSIS — R6521 Severe sepsis with septic shock: Secondary | ICD-10-CM | POA: Diagnosis present

## 2023-10-13 DIAGNOSIS — Z7189 Other specified counseling: Secondary | ICD-10-CM | POA: Diagnosis not present

## 2023-10-13 DIAGNOSIS — I70209 Unspecified atherosclerosis of native arteries of extremities, unspecified extremity: Principal | ICD-10-CM

## 2023-10-13 DIAGNOSIS — A419 Sepsis, unspecified organism: Secondary | ICD-10-CM | POA: Diagnosis present

## 2023-10-13 DIAGNOSIS — Z833 Family history of diabetes mellitus: Secondary | ICD-10-CM

## 2023-10-13 DIAGNOSIS — Z86718 Personal history of other venous thrombosis and embolism: Secondary | ICD-10-CM | POA: Diagnosis not present

## 2023-10-13 DIAGNOSIS — E039 Hypothyroidism, unspecified: Secondary | ICD-10-CM | POA: Diagnosis present

## 2023-10-13 DIAGNOSIS — I743 Embolism and thrombosis of arteries of the lower extremities: Secondary | ICD-10-CM | POA: Diagnosis present

## 2023-10-13 DIAGNOSIS — Z87891 Personal history of nicotine dependence: Secondary | ICD-10-CM

## 2023-10-13 DIAGNOSIS — N179 Acute kidney failure, unspecified: Secondary | ICD-10-CM | POA: Diagnosis not present

## 2023-10-13 DIAGNOSIS — Z66 Do not resuscitate: Secondary | ICD-10-CM | POA: Diagnosis not present

## 2023-10-13 DIAGNOSIS — Z8249 Family history of ischemic heart disease and other diseases of the circulatory system: Secondary | ICD-10-CM

## 2023-10-13 DIAGNOSIS — I998 Other disorder of circulatory system: Secondary | ICD-10-CM | POA: Diagnosis present

## 2023-10-13 DIAGNOSIS — Y848 Other medical procedures as the cause of abnormal reaction of the patient, or of later complication, without mention of misadventure at the time of the procedure: Secondary | ICD-10-CM | POA: Diagnosis present

## 2023-10-13 DIAGNOSIS — I739 Peripheral vascular disease, unspecified: Secondary | ICD-10-CM | POA: Diagnosis not present

## 2023-10-13 DIAGNOSIS — Z95828 Presence of other vascular implants and grafts: Secondary | ICD-10-CM | POA: Diagnosis not present

## 2023-10-13 DIAGNOSIS — I82402 Acute embolism and thrombosis of unspecified deep veins of left lower extremity: Secondary | ICD-10-CM | POA: Diagnosis not present

## 2023-10-13 DIAGNOSIS — T82868A Thrombosis of vascular prosthetic devices, implants and grafts, initial encounter: Secondary | ICD-10-CM

## 2023-10-13 DIAGNOSIS — Z9889 Other specified postprocedural states: Secondary | ICD-10-CM | POA: Diagnosis not present

## 2023-10-13 DIAGNOSIS — E8721 Acute metabolic acidosis: Secondary | ICD-10-CM | POA: Diagnosis not present

## 2023-10-13 DIAGNOSIS — Z7982 Long term (current) use of aspirin: Secondary | ICD-10-CM | POA: Diagnosis not present

## 2023-10-13 DIAGNOSIS — Z79899 Other long term (current) drug therapy: Secondary | ICD-10-CM

## 2023-10-13 DIAGNOSIS — E669 Obesity, unspecified: Secondary | ICD-10-CM | POA: Diagnosis present

## 2023-10-13 DIAGNOSIS — J9601 Acute respiratory failure with hypoxia: Secondary | ICD-10-CM | POA: Diagnosis not present

## 2023-10-13 DIAGNOSIS — Z6835 Body mass index (BMI) 35.0-35.9, adult: Secondary | ICD-10-CM

## 2023-10-13 HISTORY — PX: APPLICATION OF WOUND VAC: SHX5189

## 2023-10-13 HISTORY — PX: PATCH ANGIOPLASTY: SHX6230

## 2023-10-13 HISTORY — PX: FASCIOTOMY: SHX132

## 2023-10-13 HISTORY — PX: THROMBECTOMY FEMORAL ARTERY: SHX6406

## 2023-10-13 LAB — COMPREHENSIVE METABOLIC PANEL WITH GFR
ALT: 30 U/L (ref 0–44)
AST: 27 U/L (ref 15–41)
Albumin: 3.3 g/dL — ABNORMAL LOW (ref 3.5–5.0)
Alkaline Phosphatase: 147 U/L — ABNORMAL HIGH (ref 38–126)
Anion gap: 13 (ref 5–15)
BUN: 23 mg/dL — ABNORMAL HIGH (ref 6–20)
CO2: 21 mmol/L — ABNORMAL LOW (ref 22–32)
Calcium: 9.4 mg/dL (ref 8.9–10.3)
Chloride: 104 mmol/L (ref 98–111)
Creatinine, Ser: 0.93 mg/dL (ref 0.44–1.00)
GFR, Estimated: >60 mL/min (ref >60)
Glucose, Bld: 85 mg/dL (ref 70–99)
Potassium: 4 mmol/L (ref 3.5–5.1)
Sodium: 138 mmol/L (ref 135–145)
Total Bilirubin: 0.8 mg/dL (ref 0.0–1.2)
Total Protein: 7.7 g/dL (ref 6.5–8.1)

## 2023-10-13 LAB — CBC WITH DIFFERENTIAL/PLATELET
Abs Immature Granulocytes: 0.24 K/uL — ABNORMAL HIGH (ref 0.00–0.07)
Basophils Absolute: 0.1 K/uL (ref 0.0–0.1)
Basophils Relative: 1 %
Eosinophils Absolute: 0 K/uL (ref 0.0–0.5)
Eosinophils Relative: 0 %
HCT: 44.1 % (ref 36.0–46.0)
Hemoglobin: 13.8 g/dL (ref 12.0–15.0)
Immature Granulocytes: 2 %
Lymphocytes Relative: 10 %
Lymphs Abs: 1.2 K/uL (ref 0.7–4.0)
MCH: 29.1 pg (ref 26.0–34.0)
MCHC: 31.3 g/dL (ref 30.0–36.0)
MCV: 93 fL (ref 80.0–100.0)
Monocytes Absolute: 0.1 K/uL (ref 0.1–1.0)
Monocytes Relative: 0 %
Neutro Abs: 9.9 K/uL — ABNORMAL HIGH (ref 1.7–7.7)
Neutrophils Relative %: 87 %
Platelets: 301 K/uL (ref 150–400)
RBC: 4.74 MIL/uL (ref 3.87–5.11)
RDW: 13.3 % (ref 11.5–15.5)
WBC: 11.6 K/uL — ABNORMAL HIGH (ref 4.0–10.5)
nRBC: 0 % (ref 0.0–0.2)

## 2023-10-13 LAB — PREPARE RBC (CROSSMATCH)

## 2023-10-13 LAB — TROPONIN I (HIGH SENSITIVITY): Troponin I (High Sensitivity): 5 ng/L (ref <18)

## 2023-10-13 SURGERY — THROMBECTOMY, ARTERY, FEMORAL
Anesthesia: General | Site: Leg Lower | Laterality: Left

## 2023-10-13 MED ORDER — SODIUM CHLORIDE 0.9 % IV SOLN
600.0000 mg | Freq: Once | INTRAVENOUS | Status: DC
  Filled 2023-10-13: qty 10

## 2023-10-13 MED ORDER — SODIUM CHLORIDE 0.9 % IV SOLN
300.0000 mg | Freq: Once | INTRAVENOUS | Status: DC
  Filled 2023-10-13: qty 5

## 2023-10-13 MED ORDER — HEMOSTATIC AGENTS (NO CHARGE) OPTIME
TOPICAL | Status: DC | PRN
Start: 2023-10-13 — End: 2023-10-14
  Administered 2023-10-13: 1 via TOPICAL

## 2023-10-13 MED ORDER — SODIUM CHLORIDE 0.9% IV SOLUTION
Freq: Once | INTRAVENOUS | Status: AC
  Administered 2023-10-13: 1000 mL via INTRAVENOUS

## 2023-10-13 MED ORDER — SODIUM CHLORIDE 0.9 % IV SOLN
Freq: Once | INTRAVENOUS | Status: DC
  Filled 2023-10-13 (×2): qty 10

## 2023-10-13 MED ORDER — SUCCINYLCHOLINE CHLORIDE 200 MG/10ML IV SOSY
PREFILLED_SYRINGE | INTRAVENOUS | Status: DC | PRN
  Administered 2023-10-13: 120 mg via INTRAVENOUS

## 2023-10-13 MED ORDER — LACTATED RINGERS IV SOLN: INTRAVENOUS | Status: DC | PRN

## 2023-10-13 MED ORDER — SODIUM CHLORIDE 0.9% IV SOLUTION: Freq: Once | INTRAVENOUS | Status: AC

## 2023-10-13 MED ORDER — FENTANYL CITRATE (PF) 250 MCG/5ML IJ SOLN
INTRAMUSCULAR | Status: AC
Start: 2023-10-13 — End: 2023-10-13
  Filled 2023-10-13: qty 5

## 2023-10-13 MED ORDER — SODIUM CHLORIDE 0.9 % IV SOLN
2.0000 g | Freq: Once | INTRAVENOUS | Status: AC
  Administered 2023-10-13: 2 g via INTRAVENOUS
  Filled 2023-10-13: qty 12.5

## 2023-10-13 MED ORDER — PHENYLEPHRINE HCL-NACL 20-0.9 MG/250ML-% IV SOLN
INTRAVENOUS | Status: DC | PRN
  Administered 2023-10-13: 30 ug/min via INTRAVENOUS
  Administered 2023-10-13: 60 ug/min via INTRAVENOUS

## 2023-10-13 MED ORDER — CALCIUM CHLORIDE 10 % IV SOLN
INTRAVENOUS | Status: DC | PRN
  Administered 2023-10-13 (×4): 500 mg via INTRAVENOUS

## 2023-10-13 MED ORDER — SODIUM CHLORIDE 0.9 % IR SOLN
Status: DC | PRN
Start: 2023-10-13 — End: 2023-10-14
  Administered 2023-10-13: 100 mL

## 2023-10-13 MED ORDER — HEPARIN SODIUM (PORCINE) 5000 UNIT/ML IJ SOLN
INTRAMUSCULAR | Status: AC
  Administered 2023-10-13: 4000 [IU] via INTRAVENOUS
  Filled 2023-10-13: qty 1

## 2023-10-13 MED ORDER — ONDANSETRON HCL 4 MG/2ML IJ SOLN
4.0000 mg | Freq: Once | INTRAMUSCULAR | Status: AC
  Administered 2023-10-13: 4 mg via INTRAVENOUS
  Filled 2023-10-13: qty 2

## 2023-10-13 MED ORDER — METRONIDAZOLE 500 MG/100ML IV SOLN
500.0000 mg | Freq: Once | INTRAVENOUS | Status: AC
  Administered 2023-10-13: 500 mg via INTRAVENOUS
  Filled 2023-10-13: qty 100

## 2023-10-13 MED ORDER — SODIUM CHLORIDE 0.9 % IV SOLN
INTRAVENOUS | Status: DC | PRN
  Administered 2023-10-13: 500 mL

## 2023-10-13 MED ORDER — IOHEXOL 350 MG/ML SOLN
150.0000 mL | Freq: Once | INTRAVENOUS | Status: AC | PRN
  Administered 2023-10-13: 150 mL via INTRAVENOUS

## 2023-10-13 MED ORDER — MIDAZOLAM HCL 2 MG/2ML IJ SOLN
INTRAMUSCULAR | Status: AC
  Filled 2023-10-13: qty 2

## 2023-10-13 MED ORDER — HEPARIN BOLUS VIA INFUSION: 4000.0000 [IU] | Freq: Once | INTRAVENOUS | Status: DC

## 2023-10-13 MED ORDER — HEPARIN (PORCINE) 25000 UT/250ML-% IV SOLN: 1000.0000 [IU]/h | INTRAVENOUS | Status: DC

## 2023-10-13 MED ORDER — HEPARIN SODIUM (PORCINE) 5000 UNIT/ML IJ SOLN: 4000.0000 [IU] | Freq: Once | INTRAMUSCULAR | Status: AC

## 2023-10-13 MED ORDER — SODIUM CHLORIDE 0.9 % IV SOLN
600.0000 mg | INTRAVENOUS | Status: DC
  Filled 2023-10-13: qty 10

## 2023-10-13 MED ORDER — SODIUM BICARBONATE 8.4 % IV SOLN
INTRAVENOUS | Status: DC | PRN
  Administered 2023-10-13 – 2023-10-14 (×4): 50 meq via INTRAVENOUS

## 2023-10-13 MED ORDER — DEXAMETHASONE SODIUM PHOSPHATE 10 MG/ML IJ SOLN
INTRAMUSCULAR | Status: DC | PRN
  Administered 2023-10-13: 10 mg via INTRAVENOUS

## 2023-10-13 MED ORDER — ALBUMIN HUMAN 5 % IV SOLN
INTRAVENOUS | Status: DC | PRN
Start: 2023-10-13 — End: 2023-10-14

## 2023-10-13 MED ORDER — PHENYLEPHRINE 80 MCG/ML (10ML) SYRINGE FOR IV PUSH (FOR BLOOD PRESSURE SUPPORT)
PREFILLED_SYRINGE | INTRAVENOUS | Status: DC | PRN
  Administered 2023-10-13: 240 ug via INTRAVENOUS
  Administered 2023-10-13 (×2): 160 ug via INTRAVENOUS
  Administered 2023-10-13: 240 ug via INTRAVENOUS
  Administered 2023-10-13: 160 ug via INTRAVENOUS

## 2023-10-13 MED ORDER — VANCOMYCIN HCL 1000 MG IV SOLR: 1000.0000 mg | INTRAVENOUS | Status: DC

## 2023-10-13 MED ORDER — LIDOCAINE 2% (20 MG/ML) 5 ML SYRINGE
INTRAMUSCULAR | Status: DC | PRN
  Administered 2023-10-13: 70 mg via INTRAVENOUS

## 2023-10-13 MED ORDER — PROPOFOL 10 MG/ML IV BOLUS
INTRAVENOUS | Status: DC | PRN
  Administered 2023-10-13: 70 mg via INTRAVENOUS

## 2023-10-13 MED ORDER — LORAZEPAM 1 MG PO TABS
1.0000 mg | ORAL_TABLET | Freq: Once | ORAL | Status: AC
  Administered 2023-10-13: 1 mg via ORAL
  Filled 2023-10-13: qty 1

## 2023-10-13 MED ORDER — ACETAMINOPHEN 10 MG/ML IV SOLN
INTRAVENOUS | Status: DC | PRN
Start: 2023-10-13 — End: 2023-10-14
  Administered 2023-10-13: 1000 mg via INTRAVENOUS

## 2023-10-13 MED ORDER — ROCURONIUM BROMIDE 10 MG/ML (PF) SYRINGE
PREFILLED_SYRINGE | INTRAVENOUS | Status: DC | PRN
  Administered 2023-10-13 (×4): 50 mg via INTRAVENOUS

## 2023-10-13 MED ORDER — FENTANYL CITRATE (PF) 250 MCG/5ML IJ SOLN
INTRAMUSCULAR | Status: DC | PRN
  Administered 2023-10-13 (×3): 50 ug via INTRAVENOUS

## 2023-10-13 MED ORDER — HYDROMORPHONE HCL 1 MG/ML IJ SOLN
1.0000 mg | Freq: Once | INTRAMUSCULAR | Status: AC
  Administered 2023-10-13: 1 mg via INTRAVENOUS
  Filled 2023-10-13: qty 1

## 2023-10-13 MED ORDER — MORPHINE SULFATE (PF) 4 MG/ML IV SOLN
4.0000 mg | Freq: Once | INTRAVENOUS | Status: AC
  Administered 2023-10-13: 4 mg via INTRAVENOUS
  Filled 2023-10-13: qty 1

## 2023-10-13 MED ORDER — VANCOMYCIN HCL IN DEXTROSE 1-5 GM/200ML-% IV SOLN
1000.0000 mg | Freq: Once | INTRAVENOUS | Status: AC
  Administered 2023-10-13: 1000 mg via INTRAVENOUS
  Filled 2023-10-13: qty 200

## 2023-10-13 MED ORDER — PROPOFOL 10 MG/ML IV BOLUS
INTRAVENOUS | Status: AC
  Filled 2023-10-13: qty 20

## 2023-10-13 MED ORDER — ACETAMINOPHEN 10 MG/ML IV SOLN
INTRAVENOUS | Status: AC
Start: 2023-10-13 — End: 2023-10-13
  Filled 2023-10-13: qty 100

## 2023-10-13 MED ORDER — SODIUM CHLORIDE 0.9 % IV SOLN
2.0000 g | Freq: Once | INTRAVENOUS | Status: AC
  Administered 2023-10-13: 2 g via INTRAVENOUS
  Filled 2023-10-13: qty 20

## 2023-10-13 MED ORDER — HEPARIN 6000 UNIT IRRIGATION SOLUTION
Status: DC | PRN
  Administered 2023-10-13: 1

## 2023-10-13 MED ORDER — SODIUM CHLORIDE 0.9 % IV BOLUS
1000.0000 mL | Freq: Once | INTRAVENOUS | Status: AC
  Administered 2023-10-13: 1000 mL via INTRAVENOUS

## 2023-10-13 MED ORDER — CEFAZOLIN SODIUM-DEXTROSE 2-4 GM/100ML-% IV SOLN
INTRAVENOUS | Status: AC
  Filled 2023-10-13: qty 100

## 2023-10-13 MED ORDER — HEPARIN SODIUM (PORCINE) 1000 UNIT/ML IJ SOLN
INTRAMUSCULAR | Status: DC | PRN
Start: 2023-10-13 — End: 2023-10-14
  Administered 2023-10-13: 4000 [IU] via INTRAVENOUS
  Administered 2023-10-13: 7000 [IU] via INTRAVENOUS
  Administered 2023-10-13: 2000 [IU] via INTRAVENOUS
  Administered 2023-10-13: 3000 [IU] via INTRAVENOUS
  Administered 2023-10-13 (×2): 2000 [IU] via INTRAVENOUS

## 2023-10-13 MED ORDER — PIPERACILLIN-TAZOBACTAM 3.375 G IVPB 30 MIN: 3.3750 g | Freq: Once | INTRAVENOUS | Status: DC

## 2023-10-13 MED ORDER — 0.9 % SODIUM CHLORIDE (POUR BTL) OPTIME
TOPICAL | Status: DC | PRN
  Administered 2023-10-13: 2000 mL

## 2023-10-13 MED ORDER — MIDAZOLAM HCL 2 MG/2ML IJ SOLN
INTRAMUSCULAR | Status: DC | PRN
  Administered 2023-10-13: 2 mg via INTRAVENOUS

## 2023-10-13 SURGICAL SUPPLY — 63 items
BAG COUNTER SPONGE SURGICOUNT (BAG) IMPLANT
BAG ISOLATION DRAPE 18X18 (DRAPES) ×6 IMPLANT
BIT DRILL 3.5X5.5 QC CALB (BIT) ×1 IMPLANT
BLADE SURG 10 STRL SS (BLADE) ×1 IMPLANT
BNDG GAUZE DERMACEA FLUFF 4 (GAUZE/BANDAGES/DRESSINGS) ×2 IMPLANT
CANISTER SUCTION 3000ML PPV (SUCTIONS) ×3 IMPLANT
CANISTER WOUNDNEG PRESSURE 500 (CANNISTER) ×1 IMPLANT
CATH EMB 2FR 60 (CATHETERS) ×2 IMPLANT
CATH EMB 3FR 80 (CATHETERS) ×1 IMPLANT
CATH EMB 4FR 80 (CATHETERS) ×2 IMPLANT
CATH EMB 5FR 80CM (CATHETERS) ×2 IMPLANT
CATH EMB 6FR 80 (CATHETERS) ×2 IMPLANT
CLIP APPLIE 9.375 MED OPEN (MISCELLANEOUS) ×1 IMPLANT
CLIP APPLIE 9.375 SM OPEN (CLIP) ×1 IMPLANT
CLIP TI MEDIUM 24 (CLIP) ×2 IMPLANT
CLIP TI MEDIUM 6 (CLIP) ×2 IMPLANT
CLIP TI WIDE RED SMALL 24 (CLIP) ×2 IMPLANT
CLIP TI WIDE RED SMALL 6 (CLIP) ×2 IMPLANT
CNTNR URN SCR LID CUP LEK RST (MISCELLANEOUS) ×1 IMPLANT
CONNECTOR Y ATS VAC SYSTEM (MISCELLANEOUS) ×1 IMPLANT
DERMABOND ADVANCED .7 DNX12 (GAUZE/BANDAGES/DRESSINGS) ×9 IMPLANT
DRAPE DERMATAC (DRAPES) ×2 IMPLANT
DRSG VERSA FOAM LRG 10X15 (GAUZE/BANDAGES/DRESSINGS) ×1 IMPLANT
ELECT BLADE 6.5 EXT (BLADE) IMPLANT
ELECTRODE BLDE 4.0 EZ CLN MEGD (MISCELLANEOUS) IMPLANT
ELECTRODE REM PT RTRN 9FT ADLT (ELECTROSURGICAL) ×3 IMPLANT
FELT TEFLON 1X6 (MISCELLANEOUS) ×1 IMPLANT
GAUZE PAD ABD 8X10 STRL (GAUZE/BANDAGES/DRESSINGS) ×2 IMPLANT
GLOVE BIOGEL PI IND STRL 8 (GLOVE) ×3 IMPLANT
GOWN STRL REUS W/ TWL LRG LVL3 (GOWN DISPOSABLE) ×9 IMPLANT
GOWN STRL REUS W/TWL 2XL LVL3 (GOWN DISPOSABLE) ×6 IMPLANT
HEMOSTAT SNOW SURGICEL 2X4 (HEMOSTASIS) ×1 IMPLANT
INSERT FOGARTY 61MM (MISCELLANEOUS) ×2 IMPLANT
INSERT FOGARTY SM (MISCELLANEOUS) ×4 IMPLANT
KIT BASIN OR (CUSTOM PROCEDURE TRAY) ×3 IMPLANT
KIT TURNOVER KIT B (KITS) ×3 IMPLANT
NS IRRIG 1000ML POUR BTL (IV SOLUTION) ×8 IMPLANT
PACK AORTA (CUSTOM PROCEDURE TRAY) ×3 IMPLANT
PAD ARMBOARD POSITIONER FOAM (MISCELLANEOUS) ×6 IMPLANT
PATCH HEMASHIELD 8X75 (Vascular Products) ×2 IMPLANT
RETAINER VISCERA MED (MISCELLANEOUS) ×2 IMPLANT
STAPLER SKIN PROX 35W (STAPLE) ×2 IMPLANT
SUT ETHIBOND 5 LR DA (SUTURE) IMPLANT
SUT MNCRL AB 4-0 PS2 18 (SUTURE) ×8 IMPLANT
SUT PDS AB 1 TP1 96 (SUTURE) ×4 IMPLANT
SUT PROLENE 3 0 SH1 36 (SUTURE) ×4 IMPLANT
SUT PROLENE 5 0 C 1 24 (SUTURE) ×21 IMPLANT
SUT PROLENE 5 0 C 1 36 (SUTURE) IMPLANT
SUT PROLENE 7 0 BV 1 (SUTURE) ×3 IMPLANT
SUT PROLENE BLUE 7 0 (SUTURE) ×2 IMPLANT
SUT SILK 2 0 SH CR/8 (SUTURE) ×2 IMPLANT
SUT SILK 2-0 18XBRD TIE 12 (SUTURE) ×3 IMPLANT
SUT SILK 4-0 18XBRD TIE 12 (SUTURE) ×3 IMPLANT
SUT VIC AB 2-0 CT1 TAPERPNT 27 (SUTURE) ×10 IMPLANT
SUT VIC AB 3-0 SH 27X BRD (SUTURE) ×14 IMPLANT
SWAB COLLECTION DEVICE MRSA (MISCELLANEOUS) ×1 IMPLANT
SWAB CULTURE ESWAB REG 1ML (MISCELLANEOUS) ×1 IMPLANT
SYR 3ML LL SCALE MARK (SYRINGE) ×2 IMPLANT
SYR BULB IRRIG 60ML STRL (SYRINGE) ×1 IMPLANT
TOWEL GREEN STERILE (TOWEL DISPOSABLE) ×3 IMPLANT
TOWEL ~~LOC~~+RFID 17X26 BLUE (SPONGE) ×2 IMPLANT
TRAY FOLEY MTR SLVR 16FR STAT (SET/KITS/TRAYS/PACK) ×3 IMPLANT
WATER STERILE IRR 1000ML POUR (IV SOLUTION) ×5 IMPLANT

## 2023-10-13 NOTE — Progress Notes (Signed)
 PHARMACY - ANTICOAGULATION CONSULT NOTE  Pharmacy Consult for Heparin  Infusion Indication: peripheral artery occlusion  Allergies  Allergen Reactions   Amoxicillin Shortness Of Breath, Rash and Other (See Comments)    Panic attacks.     Patient Measurements: Height: 5' (152.4 cm) Weight: 76.7 kg (169 lb 1.5 oz) IBW/kg (Calculated) : 45.5 HEPARIN  DW (KG): 62.8  Vital Signs: Temp: 98.3 F (36.8 C) (07/20 1607) Temp Source: Oral (07/20 1607) BP: 154/125 (07/20 1625) Pulse Rate: 118 (07/20 1607)  Labs: Recent Labs    10/13/23 1327  HGB 13.8  HCT 44.1  PLT 301  CREATININE 0.93  TROPONINIHS 5    Estimated Creatinine Clearance: 61.8 mL/min (by C-G formula based on SCr of 0.93 mg/dL).   Medical History: Past Medical History:  Diagnosis Date   Hypothyroidism    Peripheral vascular disease (HCC)    Assessment: Patient is 58yo female presented with lower back pain. CT shows changes from prior aorto bifemoral bypass graft and suggests occlusion. Pharmacy consulted for Heparin  dosing. No prior to admission anticoagulants noted in history.  Goal of Therapy:  Heparin  level 0.3-0.7 units/ml Monitor platelets by anticoagulation protocol: Yes   Plan:  Give 4000 units bolus x 1 Start heparin  infusion at 1000 units/hr Check anti-Xa level in 6 hours and daily while on heparin  Continue to monitor H&H and platelets  Olam Fritter, PharmD, BCPS 10/13/2023 4:50 PM

## 2023-10-13 NOTE — Consult Note (Signed)
 Hospital Consult    Reason for Consult: Left lower extremity acute limb ischemia Requesting Physician: ED MRN #:  [id number]  History of Present Illness: This is a 58 y.o. female with history of aortobifemoral bypass who presents with limb ischemia left lower extremity.  She transferred down from [facility]  On exam, Randine was very uncomfortable.  She had to be limb ischemia in the left lower extremity.  Stated this started today, with accompanying back pain.  The back pain is since stopped.  Denies issues in the right lower extremity. Denies fevers, denies chills, denies bloody bowel movements.   Past Medical History:  Diagnosis Date   Hypothyroidism    Peripheral vascular disease (HCC)     Past Surgical History:  Procedure Laterality Date   ABDOMINAL AORTOGRAM N/A 05/02/2016   Procedure: Abdominal Aortogram;  Surgeon: Penne Lonni Colorado, MD;  Location: [facility] INVASIVE CV LAB;  Service: Cardiovascular;  Laterality: N/A;   ABDOMINAL AORTOGRAM W/LOWER EXTREMITY N/A 11/28/2017   Procedure: ABDOMINAL AORTOGRAM W/LOWER EXTREMITY;  Surgeon: Gretta Lonni PARAS, MD;  Location: [facility] INVASIVE CV LAB;  Service: Cardiovascular;  Laterality: N/A;  Bilatertal   AORTA - BILATERAL FEMORAL ARTERY BYPASS GRAFT Bilateral 08/25/2018   Procedure: AORTA BIFEMORAL BYPASS USING HEMASHIELD GOLD 14 x GRAFT;  Surgeon: Gretta Lonni PARAS, MD;  Location: [facility] OR;  Service: Vascular;  Laterality: Bilateral;   FEMORAL ARTERY EXPLORATION Bilateral 01/28/2015   Procedure: BILATERAL FEMORAL ARTERY EXPLORATION;  Surgeon: Krystal JULIANNA Doing, MD;  Location: [facility] OR;  Service: Vascular;  Laterality: Bilateral;   INSERTION OF ILIAC STENT Bilateral 01/28/2015   Procedure: INSERTION OF RIGHT AND LEFT ILIAC STENT;  Surgeon: Krystal JULIANNA Doing, MD;  Location: [facility] OR;  Service: Vascular;  Laterality: Bilateral;   LOWER EXTREMITY ANGIOGRAPHY N/A 05/02/2016   Procedure: Bilateral Illac Stent;  Surgeon: Penne Lonni Colorado, MD;   Location: [facility] INVASIVE CV LAB;  Service: Cardiovascular;  Laterality: N/A;   PERIPHERAL VASCULAR CATHETERIZATION N/A 01/26/2015   Procedure: Abdominal Aortogram;  Surgeon: Krystal JULIANNA Doing, MD;  Location: [facility] INVASIVE CV LAB;  Service: Cardiovascular;  Laterality: N/A;   PERIPHERAL VASCULAR CATHETERIZATION N/A 04/19/2016   Procedure: Abdominal Aortogram w/Lower Extremity;  Surgeon: Redell LITTIE Door, MD;  Location: [facility] INVASIVE CV LAB;  Service: Cardiovascular;  Laterality: N/A;   PERIPHERAL VASCULAR INTERVENTION Bilateral 11/28/2017   Procedure: PERIPHERAL VASCULAR INTERVENTION;  Surgeon: Gretta Lonni PARAS, MD;  Location: [facility] INVASIVE CV LAB;  Service: Cardiovascular;  Laterality: Bilateral;  Iliac    Allergies  Allergen Reactions   Amoxicillin Shortness Of Breath, Rash and Other (See Comments)    Panic attacks.     Prior to Admission medications   Medication Sig Start Date End Date Taking? Authorizing Provider  ACCU-CHEK AVIVA PLUS test strip TEST twice a day Patient not taking: Reported on 08/27/2023 11/08/12   Jayne Vonn DEL, MD  ACCU-CHEK SOFTCLIX LANCETS lancets TEST twice a day Patient not taking: Reported on 08/27/2023 11/08/12   Jayne Vonn DEL, MD  acetaminophen  (TYLENOL ) 500 MG tablet Take 1,000 mg by mouth 2 (two) times daily as needed for moderate pain.    [provider]  aspirin  EC 81 MG EC tablet Take 1 tablet (81 mg total) by mouth daily. 08/30/18   Bethanie Cough, PA-C  glucose monitoring kit (FREESTYLE) monitoring kit 1 each by Does not apply route as needed for other. Use as directed Patient not taking: Reported on 08/27/2023 10/08/12   Jayne Vonn DEL, MD  ibuprofen  (ADVIL )  800 MG tablet Take 1 tablet (800 mg total) by mouth every 8 (eight) hours as needed. Take with food to prevent GI upset 07/06/22   Chandra Harlene LABOR, NP  levothyroxine  (SYNTHROID , LEVOTHROID) 75 MCG tablet Take 75 mcg by mouth daily before breakfast.    [provider]  rosuvastatin  (CRESTOR ) 10 MG tablet  Take 10 mg by mouth at bedtime.     [provider]    Social History   Socioeconomic History   Marital status: Married    Spouse name: Not on file   Number of children: Not on file   Years of education: Not on file   Highest education level: Not on file  Occupational History   Not on file  Tobacco Use   Smoking status: Former    Current packs/day: 0.00    Types: Cigarettes    Quit date: 05/25/2014    Years since quitting: 9.3   Smokeless tobacco: Never  Vaping Use   Vaping status: Never Used  Substance and Sexual Activity   Alcohol  use: No    Alcohol /week: 0.0 standard drinks of alcohol    Drug use: No   Sexual activity: Not on file  Other Topics Concern   Not on file  Social History Narrative   Not on file   Social Drivers of Health   Financial Resource Strain: Not on file  Food Insecurity: Not on file  Transportation Needs: Not on file  Physical Activity: Not on file  Stress: Not on file  Social Connections: Not on file  Intimate Partner Violence: Not on file   Family History  Problem Relation Age of Onset   CAD Father    Diabetes type II Father     ROS: Otherwise negative unless mentioned in HPI  Physical Examination  Vitals:   10/13/23 1626 10/13/23 1740  BP:  92/73  Pulse:  (!) 123  Resp: (!) 21 (!) 37  Temp:    SpO2:  95%   Body mass index is 33.02 kg/m.  General:  WDWN in NAD Gait: Not observed HENT: WNL, normocephalic Pulmonary: normal non-labored breathing, without Rales, rhonchi,  wheezing Cardiac: Tachycardia Abdomen:  soft, NT/ND, no masses, midline incision from previous aortobifemoral bypass Skin: without rashes Vascular Exam/Pulses: Nonpalpable pulses bilaterally, right foot warm.  Doppler unavailable, will check in the operating room.  Left foot with both sensory and motor deficits Extremities: with ischemic changes, without Gangrene , without cellulitis; without open wounds;  Musculoskeletal: no muscle wasting or  atrophy  Neurologic: A&O X 3; in pain Psychiatric: In pain, Lymph:  Unremarkable  CBC    Component Value Date/Time   WBC 11.6 (H) 10/13/2023 1327   RBC 4.74 10/13/2023 1327   HGB 13.8 10/13/2023 1327   HCT 44.1 10/13/2023 1327   PLT 301 10/13/2023 1327   MCV 93.0 10/13/2023 1327   MCH 29.1 10/13/2023 1327   MCHC 31.3 10/13/2023 1327   RDW 13.3 10/13/2023 1327   LYMPHSABS 1.2 10/13/2023 1327   MONOABS 0.1 10/13/2023 1327   EOSABS 0.0 10/13/2023 1327   BASOSABS 0.1 10/13/2023 1327    BMET    Component Value Date/Time   NA 138 10/13/2023 1327   K 4.0 10/13/2023 1327   CL 104 10/13/2023 1327   CO2 21 (L) 10/13/2023 1327   GLUCOSE 85 10/13/2023 1327   BUN 23 (H) 10/13/2023 1327   CREATININE 0.93 10/13/2023 1327   CALCIUM  9.4 10/13/2023 1327   GFRNONAA >60 10/13/2023 1327   GFRAA >60  08/29/2018 0317    COAGS: Lab Results  Component Value Date   INR 1.3 (H) 08/25/2018   INR 1.0 08/19/2018   INR 1.06 01/26/2015     ASSESSMENT/PLAN: This is a 58 y.o. female with 6-hour history of left lower extremity limb ischemia.  This is now to be limb ischemia and requires urgent revascularization.  Upon evaluation of the CT scan, there is thrombus in the right limb as well as the right common femoral artery.  I am worried that attempted Fogarty embolectomy would likely cause more thrombus to embolize into the right lower extremity and cause right sided limb ischemia.  Patient needs bilateral common femoral artery cutdown, embolectomies. Likely source is cardioembolic. I am unsure as to whether the graft is infected, however this will not be dealt with in this operation as this would require explant which comes with even more morbidity mortality.  The goal of the operation today is to revascularize the legs and remove thrombus from the limbs.  After discussing the risks and benefits of the above, including left lower extremity fasciotomy, Livana and her husband elected to  proceed.     Fonda FORBES Rim MD MS [facility] [phone] 10/13/2023  6:11 PM

## 2023-10-13 NOTE — ED Notes (Signed)
 Received pt from [facility] via [facility]. Pt A&Ox4

## 2023-10-13 NOTE — ED Triage Notes (Signed)
 Pt BIB [facility] for lower back pain that radiates up back and neck.  Pt also with chest pressure since 1000 this morning. EMS reports vitals 142/102, HR 84, RA sats 100% and CBG 106

## 2023-10-13 NOTE — Anesthesia Procedure Notes (Signed)
 Procedure Name: Intubation Date/Time: 10/13/2023 6:47 PM  Performed by: Roddie Grate, CRNAPre-anesthesia Checklist: Patient identified, Emergency Drugs available, Suction available, Patient being monitored and Timeout performed Patient Re-evaluated:Patient Re-evaluated prior to induction Oxygen Delivery Method: Circle system utilized Preoxygenation: Pre-oxygenation with 100% oxygen Induction Type: IV induction Ventilation: Mask ventilation without difficulty Laryngoscope Size: Mac and 3 Grade View: Grade I Tube type: Oral Tube size: 7.0 mm Number of attempts: 1 Airway Equipment and Method: Stylet Placement Confirmation: ETT inserted through vocal cords under direct vision, positive ETCO2 and breath sounds checked- equal and bilateral Secured at: 22 cm Tube secured with: Tape Dental Injury: Teeth and Oropharynx as per pre-operative assessment  Comments: Smooth IV Induction. Eyes taped. Easy mask. DL x 1 with grade 1 view. Atraumatically placed, teeth and lip remain intact as pre-op. Secured with tape. Bilateral breath sounds +/=, EtCO2 +, Adequate TV, VSS.

## 2023-10-13 NOTE — ED Notes (Addendum)
 Spoke to pts husband, he is aware pt is going emergently to [facility] by EMS, for a blood blockage in her left leg for surgery. He states he is on his way back to the hospital now. He has his wifes phone incase he cannot get to his, phone number is [phone].

## 2023-10-13 NOTE — Anesthesia Preprocedure Evaluation (Addendum)
 Anesthesia Evaluation  Patient identified by MRN, date of birth, ID band Patient awake    Reviewed: Allergy & Precautions, NPO status , Patient's Chart, lab work & pertinent test results  Airway Mallampati: III  TM Distance: >3 FB Neck ROM: Full    Dental  (+) Dental Advisory Given, Missing   Pulmonary former smoker   Pulmonary exam normal breath sounds clear to auscultation       Cardiovascular + Peripheral Vascular Disease   Rhythm:Regular Rate:Tachycardia  Echo 2020  1. The left ventricle has normal systolic function, with an ejection fraction of 55-60%. The cavity size was normal. There is mildly increased left ventricular wall thickness. Left ventricular diastolic Doppler parameters are consistent with pseudonormalization. No evidence of left ventricular regional wall motion abnormalities.   2. The right ventricle has normal systolic function. The cavity was normal. There is no increase in right ventricular wall thickness.   3. No evidence of mitral valve stenosis. No mitral regurgitation.   4. The aortic valve is tricuspid no stenosis of the aortic valve.   5. The aortic root and ascending aorta are normal in size and structure.   6. The IVC was normal in size. No complete TR doppler jet so unable to estimate PA systolic pressure.     Neuro/Psych    GI/Hepatic   Endo/Other  Hypothyroidism    Renal/GU      Musculoskeletal   Abdominal  (+) + obese  Peds  Hematology   Anesthesia Other Findings   Reproductive/Obstetrics                              Anesthesia Physical Anesthesia Plan  ASA: 4 and emergent  Anesthesia Plan: General   Post-op Pain Management: Ofirmev  IV (intra-op)*   Induction: Intravenous  PONV Risk Score and Plan: 3 and Ondansetron , Dexamethasone  and Midazolam   Airway Management Planned: Oral ETT and Video Laryngoscope Planned  Additional Equipment: Arterial  line, CVP and Ultrasound Guidance Line Placement  Intra-op Plan:   Post-operative Plan: Possible Post-op intubation/ventilation  Informed Consent: I have reviewed the patients History and Physical, chart, labs and discussed the procedure including the risks, benefits and alternatives for the proposed anesthesia with the patient or authorized representative who has indicated his/her understanding and acceptance.     Dental advisory given and Consent reviewed with POA  Plan Discussed with: CRNA  Anesthesia Plan Comments: (PAT note written 08/20/2018 by Allison Zelenak, PA-C. )         Anesthesia Quick Evaluation

## 2023-10-13 NOTE — ED Notes (Signed)
 Pt transported to CT ?

## 2023-10-13 NOTE — Anesthesia Procedure Notes (Addendum)
 Central Venous Catheter Insertion Performed by: Darlyn Rush, MD, anesthesiologist Start/End7/20/2025 6:45 PM, 10/13/2023 7:15 PM Patient location: OR. Preanesthetic checklist: patient identified, IV checked, site marked, risks and benefits discussed, surgical consent, monitors and equipment checked, pre-op evaluation, timeout performed and anesthesia consent Position: supine Lidocaine  1% used for infiltration and patient sedated Hand hygiene performed  and maximum sterile barriers used  Catheter size: 9 Fr MAC introducer Procedure performed using ultrasound guided technique. Ultrasound Notes:anatomy identified, needle tip was noted to be adjacent to the nerve/plexus identified, no ultrasound evidence of intravascular and/or intraneural injection and image(s) printed for medical record Attempts: 1 Following insertion, line sutured, dressing applied and Biopatch. Post procedure assessment: blood return through all ports, free fluid flow and no air  Patient tolerated the procedure well with no immediate complications. Additional procedure comments: Pt prepped and draped for RIJ introducer. Ultrasound placed and discovered pt did not have vein sufficient size for CVL placement in RIJ. Pt re-prepped and draped for L internal jugular introducer. This proceeded without issue.SABRA

## 2023-10-13 NOTE — Anesthesia Procedure Notes (Signed)
 Arterial Line Insertion Performed by: Darlyn Rush, MD  Patient location: Pre-op. Preanesthetic checklist: patient identified, IV checked, site marked, risks and benefits discussed, surgical consent, monitors and equipment checked, pre-op evaluation, timeout performed and anesthesia consent Lidocaine  1% used for infiltration Left, radial was placed Catheter size: 20 G Hand hygiene performed , maximum sterile barriers used  and Seldinger technique used  Attempts: 4 Procedure performed using ultrasound guided technique. Ultrasound Notes:anatomy identified, needle tip was noted to be adjacent to the nerve/plexus identified, no ultrasound evidence of intravascular and/or intraneural injection and image(s) printed for medical record Following insertion, dressing applied. Post procedure assessment: normal and unchanged  Post procedure complications: unsuccessful attempts and second provider assisted. Patient tolerated the procedure well with no immediate complications. Additional procedure comments: Right aline attempted by CRNA with US  without success. Ultrasound demonstrated very small radial artery. Move to left. CRNA able to place L rad aline with US , but catheter dislodged when dressing being placed. CRNA made second attempt but was unsuccessful. Local hematoma noted. L hand appeared as though it was being hypoperfused. US  placed I went proximal to attempts on left and radial widely patent. Ulnar artery, although small, was also patent on US . I placed .

## 2023-10-13 NOTE — ED Provider Notes (Cosign Needed Addendum)
 Brianna Powers   CSN: [id number] Arrival date & time: 10/13/23  1207     Patient presents with: Back Pain   Brianna Powers is a 58 y.o. female.   Patient is a 58 year old female who presents emergency department with a chief complaint of sudden onset of lower back pain radiating up to her upper back and neck which began just prior to arrival.  She notes that the pain has since resolved.  She notes that she has also been experiencing pain to her left lower extremity and numbness to her left thigh for the past few weeks.  She does have a history of aortofemoral bypass.  She denies any associated abdominal pain.  She notes that she did have pain in her chest with no associated shortness of breath.  She does Powers that she has been experiencing some difficulty with ambulation secondary to weakness and the left lower extremity.  She does Powers that she has been experiencing some ongoing mild pain to her lower back over the past few days.  She denies any urinary bowel incontinence, saddle paresthesias.   Back Pain      Prior to Admission medications   Medication Sig Start Date End Date Taking? Authorizing Provider  ACCU-CHEK AVIVA PLUS test strip TEST twice a day Patient not taking: Reported on 08/27/2023 11/08/12   Jayne Vonn DEL, MD  ACCU-CHEK SOFTCLIX LANCETS lancets TEST twice a day Patient not taking: Reported on 08/27/2023 11/08/12   Jayne Vonn DEL, MD  acetaminophen  (TYLENOL ) 500 MG tablet Take 1,000 mg by mouth 2 (two) times daily as needed for moderate pain.    [provider]  aspirin  EC 81 MG EC tablet Take 1 tablet (81 mg total) by mouth daily. 08/30/18   Bethanie Cough, PA-C  glucose monitoring kit (FREESTYLE) monitoring kit 1 each by Does not apply route as needed for other. Use as directed Patient not taking: Reported on 08/27/2023 10/08/12   Jayne Vonn DEL, MD  ibuprofen  (ADVIL ) 800 MG tablet Take 1 tablet (800 mg  total) by mouth every 8 (eight) hours as needed. Take with food to prevent GI upset 07/06/22   Chandra Harlene LABOR, NP  levothyroxine  (SYNTHROID , LEVOTHROID) 75 MCG tablet Take 75 mcg by mouth daily before breakfast.    [provider]  rosuvastatin  (CRESTOR ) 10 MG tablet Take 10 mg by mouth at bedtime.     [provider]    Allergies: Amoxicillin    Review of Systems  Musculoskeletal:  Positive for back pain.  All other systems reviewed and are negative.   Updated Vital Signs BP (!) 143/105 (BP Location: Left Arm)   Pulse 82   Temp 97.6 F (36.4 C) (Oral)   Resp (!) 24   Ht 5' (1.524 m)   Wt 76.7 kg   SpO2 100%   BMI 33.02 kg/m   Physical Exam Vitals and nursing Powers reviewed.  Constitutional:      Appearance: Normal appearance.  HENT:     Head: Normocephalic and atraumatic.     Nose: Nose normal.     Mouth/Throat:     Mouth: Mucous membranes are moist.  Eyes:     Extraocular Movements: Extraocular movements intact.     Conjunctiva/sclera: Conjunctivae normal.     Pupils: Pupils are equal, round, and reactive to light.  Cardiovascular:     Rate and Rhythm: Normal rate and regular rhythm.     Pulses: Normal pulses.  Heart sounds: Normal heart sounds. No murmur heard.    No gallop.  Pulmonary:     Effort: Pulmonary effort is normal. No respiratory distress.     Breath sounds: Normal breath sounds. No stridor. No wheezing, rhonchi or rales.  Abdominal:     General: Abdomen is flat. Bowel sounds are normal. There is no distension.     Palpations: Abdomen is soft.     Tenderness: There is no abdominal tenderness. There is no guarding.  Musculoskeletal:        General: Normal range of motion.     Cervical back: Normal range of motion and neck supple. No rigidity or tenderness.     Comments: Nontender palpation over bilateral lower extremities, 1+ DP and PT pulses noted in bilateral lower extremities, full range of motion noted throughout, no obvious  deformity or bruising, no skin breakdown or ulceration, lacerations or abrasions, no overlying erythema or warmth  Skin:    General: Skin is warm and dry.     Findings: No bruising or rash.  Neurological:     General: No focal deficit present.     Mental Status: She is alert and oriented to person, place, and time. Mental status is at baseline.  Psychiatric:        Mood and Affect: Mood normal.        Behavior: Behavior normal.        Thought Content: Thought content normal.        Judgment: Judgment normal.     (all labs ordered are listed, but only abnormal results are displayed) Labs Reviewed  COMPREHENSIVE METABOLIC PANEL WITH GFR  CBC WITH DIFFERENTIAL/PLATELET  URINALYSIS, ROUTINE W REFLEX MICROSCOPIC  TROPONIN I (HIGH SENSITIVITY)    EKG: EKG Interpretation Date/Time:  Sunday October 13 2023 12:23:36 EDT Ventricular Rate:  81 PR Interval:  112 QRS Duration:  72 QT Interval:  322 QTC Calculation: 374 R Axis:   61  Text Interpretation: Normal sinus rhythm Nonspecific ST and T wave abnormality Abnormal ECG When compared with ECG of 25-Aug-2018 14:30, Nonspecific T wave abnormality has replaced inverted T waves in Anterior leads Confirmed by Cleotilde Rogue ([id number]) on 10/13/2023 12:48:03 PM  Radiology: No results found.   .Critical Care  Performed by: Daralene Lonni BIRCH, PA-C Authorized by: Daralene Lonni BIRCH, PA-C   Critical care provider statement:    Critical care time (minutes):  65   Critical care was necessary to treat or prevent imminent or life-threatening deterioration of the following conditions: Peripheral arterial occlusion, heparin  drip.   Critical care was time spent personally by me on the following activities:  Development of treatment plan with patient or surrogate, discussions with consultants, evaluation of patient's response to treatment, examination of patient, ordering and review of laboratory studies, ordering and review of radiographic studies,  ordering and performing treatments and interventions, pulse oximetry, re-evaluation of patient's condition and review of old charts   I assumed direction of critical care for this patient from another provider in my specialty: no     Care discussed with: admitting provider      Medications Ordered in the ED  LORazepam  (ATIVAN ) tablet 1 mg (has no administration in time range)                                    Medical Decision Making Amount and/or Complexity of Data Reviewed Labs: ordered. Radiology: ordered.  Risk  Prescription drug management.   This patient presents to the ED for concern of back pain, chest pain, left lower extremity pain, this involves an extensive number of treatment options, and is a complaint that carries with it a high risk of complications and morbidity.  The differential diagnosis includes acute arterial occlusion, acute aortic syndrome, sepsis, pneumonia, pneumothorax, hemothorax, DVT, cauda equina syndrome, vertebral osteomyelitis   Co morbidities that complicate the patient evaluation  History of peripheral arterial disease   Additional history obtained:  Additional history obtained from husband External records from outside source obtained and reviewed including medical records   Lab Tests:  I Ordered, and personally interpreted labs.  The pertinent results include: Leukocytosis, no anemia, normal kidney function, normal liver function, normal electrolytes, negative troponin   Imaging Studies ordered:  I ordered imaging studies including CTA chest abdomen and pelvis with runoff I independently visualized and interpreted imaging which showed gas within the aorta, occlusion of the peripheral arteries of the left lower extremity I agree with the radiologist interpretation   Cardiac Monitoring: / EKG:  The patient was maintained on a cardiac monitor.  I personally viewed and interpreted the cardiac monitored which showed an underlying rhythm  of: Normal sinus rhythm, no ST/T wave changes, no ischemic changes, no STEMI   Consultations Obtained:  I requested consultation with the vascular surgery, Dr. Silver,  and discussed lab and imaging findings as well as pertinent plan - they recommend: Emergent transfer ER to ER to [facility]   Problem List / ED Course / Critical interventions / Medication management  Patient does remain stable at this time but has become more tachycardic while in the emergency department.  CT of the abdomen pelvis is concerning for apparent occlusion in the left lower extremity.  This was emergently discussed with Dr. Silver with vascular surgery who did recommend emergent ER to ER transfer to [facility] for further evaluation.  CT scan was also concerning for possible infection within the aorta.  I was only able to give the patient a dose of Rocephin  before being transported to [facility].  This was relayed to the accepting team to provide further antibiotics.  Patient has been afebrile on the emergency department.  She does have a mild leukocytosis.  MRI read is currently pending at this point but low suspicion for pathology within the lumbar spine at this time given the CTA findings.  Patient was provided with IV fluids and was given a heparin  bolus. I ordered medication including heparin , Ativan , morphine , Dilaudid , IV fluids, Rocephin  for peripheral arterial occlusion, possible infection within the aorta Reevaluation of the patient after these medicines showed that the patient improved I have reviewed the patients home medicines and have made adjustments as needed   Social Determinants of Health:  None   Test / Admission - Considered:  Emergent ER to ER transfer     Final diagnoses:  None    ED Discharge Orders     None          Daralene Lonni BIRCH, PA-C 10/13/23 1729

## 2023-10-14 ENCOUNTER — Encounter (HOSPITAL_COMMUNITY): Payer: Self-pay | Admitting: Vascular Surgery

## 2023-10-14 ENCOUNTER — Inpatient Hospital Stay (HOSPITAL_COMMUNITY)

## 2023-10-14 ENCOUNTER — Other Ambulatory Visit: Payer: Self-pay

## 2023-10-14 DIAGNOSIS — E039 Hypothyroidism, unspecified: Secondary | ICD-10-CM | POA: Diagnosis not present

## 2023-10-14 DIAGNOSIS — E785 Hyperlipidemia, unspecified: Secondary | ICD-10-CM | POA: Diagnosis not present

## 2023-10-14 DIAGNOSIS — I70222 Atherosclerosis of native arteries of extremities with rest pain, left leg: Secondary | ICD-10-CM | POA: Diagnosis present

## 2023-10-14 DIAGNOSIS — Z515 Encounter for palliative care: Secondary | ICD-10-CM

## 2023-10-14 DIAGNOSIS — Z9889 Other specified postprocedural states: Secondary | ICD-10-CM | POA: Diagnosis not present

## 2023-10-14 DIAGNOSIS — Z7189 Other specified counseling: Secondary | ICD-10-CM | POA: Diagnosis not present

## 2023-10-14 DIAGNOSIS — I743 Embolism and thrombosis of arteries of the lower extremities: Secondary | ICD-10-CM

## 2023-10-14 DIAGNOSIS — I70209 Unspecified atherosclerosis of native arteries of extremities, unspecified extremity: Principal | ICD-10-CM

## 2023-10-14 DIAGNOSIS — J9601 Acute respiratory failure with hypoxia: Secondary | ICD-10-CM

## 2023-10-14 DIAGNOSIS — E8721 Acute metabolic acidosis: Secondary | ICD-10-CM

## 2023-10-14 DIAGNOSIS — I998 Other disorder of circulatory system: Secondary | ICD-10-CM

## 2023-10-14 DIAGNOSIS — I82402 Acute embolism and thrombosis of unspecified deep veins of left lower extremity: Secondary | ICD-10-CM

## 2023-10-14 DIAGNOSIS — N179 Acute kidney failure, unspecified: Secondary | ICD-10-CM

## 2023-10-14 DIAGNOSIS — Z9911 Dependence on respirator [ventilator] status: Secondary | ICD-10-CM

## 2023-10-14 LAB — POCT I-STAT 7, (LYTES, BLD GAS, ICA,H+H)
Acid-base deficit: 10 mmol/L — ABNORMAL HIGH (ref 0.0–2.0)
Acid-base deficit: 10 mmol/L — ABNORMAL HIGH (ref 0.0–2.0)
Acid-base deficit: 12 mmol/L — ABNORMAL HIGH (ref 0.0–2.0)
Acid-base deficit: 9 mmol/L — ABNORMAL HIGH (ref 0.0–2.0)
Acid-base deficit: 6 mmol/L — ABNORMAL HIGH (ref 0.0–2.0)
Acid-base deficit: 12 mmol/L — ABNORMAL HIGH (ref 0.0–2.0)
Acid-base deficit: 19 mmol/L — ABNORMAL HIGH (ref 0.0–2.0)
Bicarbonate: 15.3 mmol/L — ABNORMAL LOW (ref 20.0–28.0)
Bicarbonate: 15.7 mmol/L — ABNORMAL LOW (ref 20.0–28.0)
Bicarbonate: 16.5 mmol/L — ABNORMAL LOW (ref 20.0–28.0)
Bicarbonate: 17 mmol/L — ABNORMAL LOW (ref 20.0–28.0)
Bicarbonate: 21.7 mmol/L (ref 20.0–28.0)
Bicarbonate: 13.9 mmol/L — ABNORMAL LOW (ref 20.0–28.0)
Bicarbonate: 8.5 mmol/L — ABNORMAL LOW (ref 20.0–28.0)
Calcium, Ion: 1.08 mmol/L — ABNORMAL LOW (ref 1.15–1.40)
Calcium, Ion: 1.17 mmol/L (ref 1.15–1.40)
Calcium, Ion: 1.34 mmol/L (ref 1.15–1.40)
Calcium, Ion: 1.5 mmol/L — ABNORMAL HIGH (ref 1.15–1.40)
Calcium, Ion: 1.35 mmol/L (ref 1.15–1.40)
Calcium, Ion: 1.11 mmol/L — ABNORMAL LOW (ref 1.15–1.40)
Calcium, Ion: 0.98 mmol/L — ABNORMAL LOW (ref 1.15–1.40)
HCT: 16 % — ABNORMAL LOW (ref 36.0–46.0)
HCT: 29 % — ABNORMAL LOW (ref 36.0–46.0)
HCT: 29 % — ABNORMAL LOW (ref 36.0–46.0)
HCT: 30 % — ABNORMAL LOW (ref 36.0–46.0)
HCT: 36 % (ref 36.0–46.0)
HCT: 23 % — ABNORMAL LOW (ref 36.0–46.0)
HCT: 15 % — ABNORMAL LOW (ref 36.0–46.0)
Hemoglobin: 10.2 g/dL — ABNORMAL LOW (ref 12.0–15.0)
Hemoglobin: 5.4 g/dL — CL (ref 12.0–15.0)
Hemoglobin: 9.9 g/dL — ABNORMAL LOW (ref 12.0–15.0)
Hemoglobin: 9.9 g/dL — ABNORMAL LOW (ref 12.0–15.0)
Hemoglobin: 12.2 g/dL (ref 12.0–15.0)
Hemoglobin: 7.8 g/dL — ABNORMAL LOW (ref 12.0–15.0)
Hemoglobin: 5.1 g/dL — CL (ref 12.0–15.0)
O2 Saturation: 100 %
O2 Saturation: 100 %
O2 Saturation: 100 %
O2 Saturation: 100 %
O2 Saturation: 98 %
O2 Saturation: 99 %
O2 Saturation: 98 %
Patient temperature: 35.9
Patient temperature: 36
Patient temperature: 97.3
Patient temperature: 96.4
Potassium: 3 mmol/L — ABNORMAL LOW (ref 3.5–5.1)
Potassium: 3.6 mmol/L (ref 3.5–5.1)
Potassium: 4.5 mmol/L (ref 3.5–5.1)
Potassium: 4.6 mmol/L (ref 3.5–5.1)
Potassium: 4.7 mmol/L (ref 3.5–5.1)
Potassium: 5.8 mmol/L — ABNORMAL HIGH (ref 3.5–5.1)
Potassium: 6.5 mmol/L (ref 3.5–5.1)
Sodium: 138 mmol/L (ref 135–145)
Sodium: 140 mmol/L (ref 135–145)
Sodium: 140 mmol/L (ref 135–145)
Sodium: 142 mmol/L (ref 135–145)
Sodium: 138 mmol/L (ref 135–145)
Sodium: 135 mmol/L (ref 135–145)
Sodium: 135 mmol/L (ref 135–145)
TCO2: 17 mmol/L — ABNORMAL LOW (ref 22–32)
TCO2: 17 mmol/L — ABNORMAL LOW (ref 22–32)
TCO2: 17 mmol/L — ABNORMAL LOW (ref 22–32)
TCO2: 18 mmol/L — ABNORMAL LOW (ref 22–32)
TCO2: 23 mmol/L (ref 22–32)
TCO2: 15 mmol/L — ABNORMAL LOW (ref 22–32)
TCO2: 9 mmol/L — ABNORMAL LOW (ref 22–32)
pCO2 arterial: 30.7 mmHg — ABNORMAL LOW (ref 32–48)
pCO2 arterial: 31.4 mmHg — ABNORMAL LOW (ref 32–48)
pCO2 arterial: 39.4 mmHg (ref 32–48)
pCO2 arterial: 41.5 mmHg (ref 32–48)
pCO2 arterial: 51.6 mmHg — ABNORMAL HIGH (ref 32–48)
pCO2 arterial: 29.5 mmHg — ABNORMAL LOW (ref 32–48)
pCO2 arterial: 25 mmHg — ABNORMAL LOW (ref 32–48)
pH, Arterial: 7.198 — CL (ref 7.35–7.45)
pH, Arterial: 7.215 — ABNORMAL LOW (ref 7.35–7.45)
pH, Arterial: 7.302 — ABNORMAL LOW (ref 7.35–7.45)
pH, Arterial: 7.338 — ABNORMAL LOW (ref 7.35–7.45)
pH, Arterial: 7.231 — ABNORMAL LOW (ref 7.35–7.45)
pH, Arterial: 7.276 — ABNORMAL LOW (ref 7.35–7.45)
pH, Arterial: 7.133 — CL (ref 7.35–7.45)
pO2, Arterial: 191 mmHg — ABNORMAL HIGH (ref 83–108)
pO2, Arterial: 215 mmHg — ABNORMAL HIGH (ref 83–108)
pO2, Arterial: 219 mmHg — ABNORMAL HIGH (ref 83–108)
pO2, Arterial: 226 mmHg — ABNORMAL HIGH (ref 83–108)
pO2, Arterial: 134 mmHg — ABNORMAL HIGH (ref 83–108)
pO2, Arterial: 126 mmHg — ABNORMAL HIGH (ref 83–108)
pO2, Arterial: 127 mmHg — ABNORMAL HIGH (ref 83–108)

## 2023-10-14 LAB — BASIC METABOLIC PANEL WITH GFR
Anion gap: 16 — ABNORMAL HIGH (ref 5–15)
BUN: 20 mg/dL (ref 6–20)
CO2: 20 mmol/L — ABNORMAL LOW (ref 22–32)
Calcium: 9.2 mg/dL (ref 8.9–10.3)
Chloride: 104 mmol/L (ref 98–111)
Creatinine, Ser: 1.1 mg/dL — ABNORMAL HIGH (ref 0.44–1.00)
GFR, Estimated: 59 mL/min — ABNORMAL LOW (ref >60)
Glucose, Bld: 184 mg/dL — ABNORMAL HIGH (ref 70–99)
Potassium: 5.1 mmol/L (ref 3.5–5.1)
Sodium: 140 mmol/L (ref 135–145)

## 2023-10-14 LAB — CBC
HCT: 34.9 % — ABNORMAL LOW (ref 36.0–46.0)
Hemoglobin: 12.2 g/dL (ref 12.0–15.0)
MCH: 30.8 pg (ref 26.0–34.0)
MCHC: 35 g/dL (ref 30.0–36.0)
MCV: 88.1 fL (ref 80.0–100.0)
Platelets: 141 K/uL — ABNORMAL LOW (ref 150–400)
RBC: 3.96 MIL/uL (ref 3.87–5.11)
RDW: 14 % (ref 11.5–15.5)
WBC: 38.4 K/uL — ABNORMAL HIGH (ref 4.0–10.5)
nRBC: 0 % (ref 0.0–0.2)

## 2023-10-14 LAB — HIV ANTIBODY (ROUTINE TESTING W REFLEX): HIV Screen 4th Generation wRfx: NONREACTIVE

## 2023-10-14 LAB — POCT ACTIVATED CLOTTING TIME
Activated Clotting Time: 193 s
Activated Clotting Time: 216 s
Activated Clotting Time: 216 s
Activated Clotting Time: 245 s
Activated Clotting Time: 268 s

## 2023-10-14 LAB — LIPID PANEL
Cholesterol: 64 mg/dL (ref 0–200)
HDL: 18 mg/dL — ABNORMAL LOW (ref >40)
LDL Cholesterol: 13 mg/dL (ref 0–99)
Total CHOL/HDL Ratio: 3.6 ratio
Triglycerides: 167 mg/dL — ABNORMAL HIGH (ref <150)
VLDL: 33 mg/dL (ref 0–40)

## 2023-10-14 LAB — URINALYSIS, ROUTINE W REFLEX MICROSCOPIC
Bilirubin Urine: NEGATIVE
Glucose, UA: NEGATIVE mg/dL
Ketones, ur: NEGATIVE mg/dL
Leukocytes,Ua: NEGATIVE
Nitrite: NEGATIVE
Protein, ur: NEGATIVE mg/dL
Specific Gravity, Urine: 1.015 (ref 1.005–1.030)
pH: 5 (ref 5.0–8.0)

## 2023-10-14 LAB — HEMOGLOBIN A1C
Hgb A1c MFr Bld: 5.9 % — ABNORMAL HIGH (ref 4.8–5.6)
Mean Plasma Glucose: 122.63 mg/dL

## 2023-10-14 LAB — GLUCOSE, CAPILLARY
Glucose-Capillary: 159 mg/dL — ABNORMAL HIGH (ref 70–99)
Glucose-Capillary: 159 mg/dL — ABNORMAL HIGH (ref 70–99)
Glucose-Capillary: 199 mg/dL — ABNORMAL HIGH (ref 70–99)
Glucose-Capillary: 180 mg/dL — ABNORMAL HIGH (ref 70–99)

## 2023-10-14 LAB — HEPARIN LEVEL (UNFRACTIONATED): Heparin Unfractionated: 0.89 [IU]/mL — ABNORMAL HIGH (ref 0.30–0.70)

## 2023-10-14 LAB — MRSA NEXT GEN BY PCR, NASAL: MRSA by PCR Next Gen: NOT DETECTED

## 2023-10-14 LAB — MAGNESIUM: Magnesium: 1.7 mg/dL (ref 1.7–2.4)

## 2023-10-14 MED ORDER — PHENOL 1.4 % MT LIQD: 1.0000 | OROMUCOSAL | Status: DC | PRN

## 2023-10-14 MED ORDER — ONDANSETRON HCL 4 MG/2ML IJ SOLN: 4.0000 mg | Freq: Four times a day (QID) | INTRAMUSCULAR | Status: DC | PRN

## 2023-10-14 MED ORDER — SODIUM BICARBONATE 8.4 % IV SOLN
INTRAVENOUS | Status: DC
  Filled 2023-10-14: qty 1000

## 2023-10-14 MED ORDER — FAMOTIDINE 20 MG PO TABS
20.0000 mg | ORAL_TABLET | Freq: Two times a day (BID) | ORAL | Status: DC
  Administered 2023-10-14: 20 mg
  Filled 2023-10-14 (×2): qty 1

## 2023-10-14 MED ORDER — NOREPINEPHRINE 4 MG/250ML-% IV SOLN
INTRAVENOUS | Status: AC
  Filled 2023-10-14: qty 250

## 2023-10-14 MED ORDER — SODIUM CHLORIDE 0.9 % IV SOLN
2.0000 g | INTRAVENOUS | Status: DC
  Administered 2023-10-14: 2 g via INTRAVENOUS
  Filled 2023-10-14: qty 20

## 2023-10-14 MED ORDER — PHENYLEPHRINE HCL-NACL 20-0.9 MG/250ML-% IV SOLN
0.0000 ug/min | INTRAVENOUS | Status: DC
  Administered 2023-10-14: 80 ug/min via INTRAVENOUS
  Administered 2023-10-14: 50 ug/min via INTRAVENOUS
  Filled 2023-10-14 (×2): qty 250

## 2023-10-14 MED ORDER — SODIUM BICARBONATE 8.4 % IV SOLN
50.0000 meq | Freq: Once | INTRAVENOUS | Status: AC
  Administered 2023-10-14: 50 meq via INTRAVENOUS
  Filled 2023-10-14: qty 50

## 2023-10-14 MED ORDER — BISACODYL 5 MG PO TBEC: 5.0000 mg | DELAYED_RELEASE_TABLET | Freq: Every day | ORAL | Status: DC | PRN

## 2023-10-14 MED ORDER — DOCUSATE SODIUM 100 MG PO CAPS: 100.0000 mg | ORAL_CAPSULE | Freq: Every day | ORAL | Status: DC

## 2023-10-14 MED ORDER — SODIUM BICARBONATE 8.4 % IV SOLN
INTRAVENOUS | Status: AC
  Filled 2023-10-14: qty 50

## 2023-10-14 MED ORDER — SODIUM CHLORIDE 0.9% FLUSH
10.0000 mL | Freq: Two times a day (BID) | INTRAVENOUS | Status: DC
  Administered 2023-10-14: 10 mL

## 2023-10-14 MED ORDER — ONDANSETRON HCL 4 MG/2ML IJ SOLN
INTRAMUSCULAR | Status: DC | PRN
  Administered 2023-10-14: 4 mg via INTRAVENOUS

## 2023-10-14 MED ORDER — FENTANYL 2500MCG IN NS 250ML (10MCG/ML) PREMIX INFUSION
0.0000 ug/h | INTRAVENOUS | Status: DC
  Administered 2023-10-14: 25 ug/h via INTRAVENOUS
  Filled 2023-10-14: qty 250

## 2023-10-14 MED ORDER — PLASMA-LYTE 148 IV SOLN
INTRAVENOUS | Status: DC
  Filled 2023-10-14: qty 500

## 2023-10-14 MED ORDER — DIAZEPAM 5 MG/ML IJ SOLN: 2.5000 mg | INTRAMUSCULAR | Status: DC | PRN

## 2023-10-14 MED ORDER — OXYCODONE-ACETAMINOPHEN 5-325 MG PO TABS: 1.0000 | ORAL_TABLET | ORAL | Status: DC | PRN

## 2023-10-14 MED ORDER — CALCIUM GLUCONATE-NACL 1-0.675 GM/50ML-% IV SOLN
INTRAVENOUS | Status: AC
  Filled 2023-10-14: qty 100

## 2023-10-14 MED ORDER — PROPOFOL 500 MG/50ML IV EMUL
INTRAVENOUS | Status: DC | PRN
  Administered 2023-10-14: 50 ug/kg/min via INTRAVENOUS

## 2023-10-14 MED ORDER — ASPIRIN 81 MG PO TBEC
81.0000 mg | DELAYED_RELEASE_TABLET | Freq: Every day | ORAL | Status: DC
  Filled 2023-10-14: qty 1

## 2023-10-14 MED ORDER — MORPHINE SULFATE (PF) 2 MG/ML IV SOLN
2.0000 mg | INTRAVENOUS | Status: DC | PRN
  Administered 2023-10-14: 5 mg via INTRAVENOUS
  Filled 2023-10-14: qty 3

## 2023-10-14 MED ORDER — SODIUM BICARBONATE 8.4 % IV SOLN
50.0000 meq | Freq: Once | INTRAVENOUS | Status: AC
  Administered 2023-10-14: 50 meq via INTRAVENOUS

## 2023-10-14 MED ORDER — ORAL CARE MOUTH RINSE: 15.0000 mL | OROMUCOSAL | Status: DC | PRN

## 2023-10-14 MED ORDER — LACTATED RINGERS IV BOLUS
1000.0000 mL | Freq: Once | INTRAVENOUS | Status: AC
  Administered 2023-10-14: 1000 mL via INTRAVENOUS

## 2023-10-14 MED ORDER — VANCOMYCIN HCL 750 MG/150ML IV SOLN
750.0000 mg | INTRAVENOUS | Status: DC
  Administered 2023-10-14: 750 mg via INTRAVENOUS
  Filled 2023-10-14: qty 150

## 2023-10-14 MED ORDER — GLYCOPYRROLATE 0.2 MG/ML IJ SOLN: 0.4000 mg | INTRAMUSCULAR | Status: DC | PRN

## 2023-10-14 MED ORDER — LEVOTHYROXINE SODIUM 88 MCG PO TABS
88.0000 ug | ORAL_TABLET | Freq: Every day | ORAL | Status: DC
  Filled 2023-10-14 (×2): qty 1

## 2023-10-14 MED ORDER — PROPOFOL 1000 MG/100ML IV EMUL
0.0000 ug/kg/min | INTRAVENOUS | Status: DC
  Administered 2023-10-14: 10 ug/kg/min via INTRAVENOUS
  Administered 2023-10-14 (×2): 40 ug/kg/min via INTRAVENOUS
  Filled 2023-10-14 (×4): qty 100

## 2023-10-14 MED ORDER — ORAL CARE MOUTH RINSE
15.0000 mL | OROMUCOSAL | Status: DC
Start: 2023-10-14 — End: 2023-10-15
  Administered 2023-10-14 (×7): 15 mL via OROMUCOSAL

## 2023-10-14 MED ORDER — ACETAMINOPHEN 325 MG PO TABS: 325.0000 mg | ORAL_TABLET | ORAL | Status: DC | PRN

## 2023-10-14 MED ORDER — CALCIUM GLUCONATE-NACL 1-0.675 GM/50ML-% IV SOLN
INTRAVENOUS | Status: AC
  Filled 2023-10-14: qty 50

## 2023-10-14 MED ORDER — CALCIUM GLUCONATE-NACL 2-0.675 GM/100ML-% IV SOLN
2.0000 g | Freq: Once | INTRAVENOUS | Status: AC
  Administered 2023-10-14: 2000 mg via INTRAVENOUS
  Filled 2023-10-14: qty 100

## 2023-10-14 MED ORDER — BIOTENE DRY MOUTH MT LIQD
15.0000 mL | OROMUCOSAL | Status: DC | PRN
Start: 2023-10-14 — End: 2023-10-15

## 2023-10-14 MED ORDER — POTASSIUM CHLORIDE CRYS ER 20 MEQ PO TBCR: 40.0000 meq | EXTENDED_RELEASE_TABLET | Freq: Every day | ORAL | Status: DC | PRN

## 2023-10-14 MED ORDER — HEPARIN (PORCINE) 25000 UT/250ML-% IV SOLN
1100.0000 [IU]/h | INTRAVENOUS | Status: DC
  Administered 2023-10-14: 1100 [IU]/h via INTRAVENOUS
  Filled 2023-10-14: qty 250

## 2023-10-14 MED ORDER — FENTANYL BOLUS VIA INFUSION: 25.0000 ug | INTRAVENOUS | Status: DC | PRN

## 2023-10-14 MED ORDER — FENTANYL CITRATE PF 50 MCG/ML IJ SOSY: 50.0000 ug | PREFILLED_SYRINGE | INTRAMUSCULAR | Status: DC | PRN

## 2023-10-14 MED ORDER — NOREPINEPHRINE 4 MG/250ML-% IV SOLN
0.0000 ug/min | INTRAVENOUS | Status: DC
  Administered 2023-10-14: 2 ug/min via INTRAVENOUS
  Administered 2023-10-14: 40 ug/min via INTRAVENOUS
  Filled 2023-10-14: qty 250

## 2023-10-14 MED ORDER — ACETAMINOPHEN 650 MG RE SUPP: 325.0000 mg | RECTAL | Status: DC | PRN

## 2023-10-14 MED ORDER — LABETALOL HCL 5 MG/ML IV SOLN: 10.0000 mg | INTRAVENOUS | Status: DC | PRN

## 2023-10-14 MED ORDER — CHLORHEXIDINE GLUCONATE CLOTH 2 % EX PADS
6.0000 | MEDICATED_PAD | Freq: Every day | CUTANEOUS | Status: DC
  Administered 2023-10-14: 6 via TOPICAL

## 2023-10-14 MED ORDER — SODIUM BICARBONATE 8.4 % IV SOLN
INTRAVENOUS | Status: AC
  Administered 2023-10-14: 50 meq via INTRAVENOUS
  Filled 2023-10-14: qty 50

## 2023-10-14 MED ORDER — SENNOSIDES-DOCUSATE SODIUM 8.6-50 MG PO TABS: 1.0000 | ORAL_TABLET | Freq: Every evening | ORAL | Status: DC | PRN

## 2023-10-14 MED ORDER — HYDRALAZINE HCL 20 MG/ML IJ SOLN: 5.0000 mg | INTRAMUSCULAR | Status: DC | PRN

## 2023-10-14 MED ORDER — SODIUM BICARBONATE 8.4 % IV SOLN: 50.0000 meq | Freq: Once | INTRAVENOUS | Status: AC

## 2023-10-14 MED ORDER — INSULIN ASPART 100 UNIT/ML IJ SOLN
0.0000 [IU] | INTRAMUSCULAR | Status: DC
  Administered 2023-10-14 (×3): 3 [IU] via SUBCUTANEOUS

## 2023-10-14 MED ORDER — ROSUVASTATIN CALCIUM 5 MG PO TABS: 10.0000 mg | ORAL_TABLET | Freq: Every day | ORAL | Status: DC

## 2023-10-14 MED ORDER — MAGNESIUM SULFATE 2 GM/50ML IV SOLN
2.0000 g | Freq: Once | INTRAVENOUS | Status: AC
  Administered 2023-10-14: 2 g via INTRAVENOUS
  Filled 2023-10-14: qty 50

## 2023-10-14 MED ORDER — SODIUM CHLORIDE 0.9 % IV SOLN: 500.0000 mL | Freq: Once | INTRAVENOUS | Status: DC | PRN

## 2023-10-14 MED ORDER — FENTANYL CITRATE PF 50 MCG/ML IJ SOSY
25.0000 ug | PREFILLED_SYRINGE | Freq: Once | INTRAMUSCULAR | Status: AC
  Administered 2023-10-14: 25 ug via INTRAVENOUS
  Filled 2023-10-14: qty 1

## 2023-10-14 MED ORDER — POLYVINYL ALCOHOL 1.4 % OP SOLN: 1.0000 [drp] | Freq: Four times a day (QID) | OPHTHALMIC | Status: DC | PRN

## 2023-10-14 MED ORDER — SODIUM CHLORIDE 0.9% FLUSH: 10.0000 mL | INTRAVENOUS | Status: DC | PRN

## 2023-10-14 MED ORDER — PLASMA-LYTE A IV SOLN: INTRAVENOUS | Status: DC

## 2023-10-14 MED ORDER — POLYETHYLENE GLYCOL 3350 17 G PO PACK
17.0000 g | PACK | Freq: Every day | ORAL | Status: DC
Start: 2023-10-14 — End: 2023-10-15
  Filled 2023-10-14: qty 1

## 2023-10-14 MED ORDER — METOPROLOL TARTRATE 5 MG/5ML IV SOLN: 2.5000 mg | INTRAVENOUS | Status: DC | PRN

## 2023-10-15 LAB — PREPARE FRESH FROZEN PLASMA
Unit division: 0
Unit division: 0

## 2023-10-15 LAB — BPAM FFP
Blood Product Expiration Date: 202507242359
Blood Product Expiration Date: 202507242359
ISSUE DATE / TIME: 202507202223
ISSUE DATE / TIME: 202507202223
Unit Type and Rh: 6200
Unit Type and Rh: 6200

## 2023-10-16 LAB — SURGICAL PATHOLOGY

## 2023-10-17 LAB — TYPE AND SCREEN
ABO/RH(D): A POS
Antibody Screen: NEGATIVE
Unit division: 0
Unit division: 0
Unit division: 0
Unit division: 0
Unit division: 0
Unit division: 0

## 2023-10-17 LAB — BPAM RBC
Blood Product Expiration Date: 202508162359
Blood Product Expiration Date: 202508162359
Blood Product Expiration Date: 202508162359
Blood Product Expiration Date: 202508162359
Blood Product Expiration Date: 202508162359
Blood Product Expiration Date: 202508162359
ISSUE DATE / TIME: 202507202052
ISSUE DATE / TIME: 202507202052
ISSUE DATE / TIME: 202507202211
ISSUE DATE / TIME: 202507202211
ISSUE DATE / TIME: 202507202211
ISSUE DATE / TIME: 202507202211
Unit Type and Rh: 6200
Unit Type and Rh: 6200
Unit Type and Rh: 6200
Unit Type and Rh: 6200
Unit Type and Rh: 6200
Unit Type and Rh: 6200

## 2023-10-18 LAB — AEROBIC/ANAEROBIC CULTURE W GRAM STAIN (SURGICAL/DEEP WOUND)

## 2023-10-21 LAB — CULTURE, BLOOD (ROUTINE X 2): Culture: NO GROWTH

## 2023-10-25 NOTE — Transfer of Care (Signed)
 Immediate Anesthesia Transfer of Care Note  Patient: Brianna Powers  Procedure(s) Performed: BILATERAL FEMORAL ARTERY THROMBECTOMY (Bilateral: Groin) PACTH ANGIOPLASTY BILATERAL FEMORAL ARTERY, USING HEMASHIELD PATCH (Bilateral: Groin) FOUR COMPARTMENT LEFT LOWER FASCIOTOMY (Left: Leg Lower) BILATERAL GROIN APPLICATION, WOUND VAC (Bilateral: Groin)  Patient Location: ICU  Anesthesia Type:General  Level of Consciousness: Patient remains intubated per anesthesia plan  Airway & Oxygen Therapy: Patient remains intubated per anesthesia plan and Patient placed on Ventilator (see vital sign flow sheet for setting)  Post-op Assessment: Report given to RN and Post -op Vital signs reviewed and stable  Post vital signs: Reviewed and stable  Last Vitals:  Vitals Value Taken Time  BP 127/90 10-16-2023 01:00  Temp    Pulse 99 Oct 16, 2023 01:00  Resp 18 16-Oct-2023 01:00  SpO2 98 % 2023/10/16 01:00  Vitals shown include unfiled device data.  Last Pain:  Vitals:   10/13/23 1726  TempSrc:   PainSc: 6          Complications: No notable events documented.

## 2023-10-25 NOTE — Consult Note (Signed)
 NAME:  Brianna Powers, MRN:  [id number], DOB:  1965/11/13, LOS: 1 ADMISSION DATE:  10/13/2023, CONSULTATION DATE:  10/24/2023 REFERRING MD:  Lanis, vascular surgeon CHIEF COMPLAINT:  left limb ischemia   History of Present Illness:  58 year old female with past medical history of hypothyroidism, peripheral vascular disease, aortobifemoral bypass who presents as transfer from [facility] for left lower limb ischemia. Noted to have LLE numbness and pain for the past few weeks. She had CT angio Ao+Bifem in the ED showing no contrast in the graft concerning for occlusion, no discernible runoff into the LLE. Labs with WBC 11.6, CMP okay with mild elevation in BUN to 23. Dr. Lanis with vascular surgery was consulted and patient transferred to [facility] for intervention. She was taken urgently to surgery in which foul-smelling thrombus was found in the entirety of the left lower extremity. They were not able to re-establish outlfow into the left leg and she was transferred to ICU post-operative intubated, ccm consulted.   Pertinent  Medical History  hypothyroidism, peripheral vascular disease, aortobifemoral bypass  Significant Hospital Events: Including procedures, antibiotic start and stop dates in addition to other pertinent events   7/20: [facility] with left leg pain  2023/10/24: vascular surg with Dr. Lanis, unable to get flow into left leg, infected thrombus>ICU post op  Interim History / Subjective:  Admit to ICU   Objective   Blood pressure 92/73, pulse (!) 123, temperature 98.3 F (36.8 C), temperature source Oral, resp. rate (!) 37, height 5' (1.524 m), weight 76.7 kg, SpO2 95%.        Intake/Output Summary (Last 24 hours) at 2023/10/24 0102 Last data filed at October 24, 2023 0018 Gross per 24 hour  Intake 7195 ml  Output 2625 ml  Net 4570 ml   Filed Weights   10/13/23 1214  Weight: 76.7 kg    Examination: General: middle aged female, intubated, acutely ill appearing  HENT: ncat, perrla, ett, ogt  Lungs:  ctab, vented, synchronous  Cardiovascular: s1s2, no murmur, rub, gallop Abdomen: rounded, soft, wound vac to right groin  Extremities: left leg ischemic, purple/blue, cold, no palpable or doppler pulses. 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 Found to be ischemic on CT showing no flow into the left leg. Went emergently with vascular for thrombectomy. Entire leg thrombosed with foul-smelling thrombus. Unable to establish reperfusion of the leg.  - vascular following, appreciate management  - will ultimately need left AKA if family wishes to proceed  - will need explant of graft if wishes  - con't heparin  gtt, asa 81mg  daily  - pain control with morphine , percocet, tylenol   - f/u graft cultures  - will place on vancomycin , rocephin  for now to cover infected thrombus, narrow with cultures   Post-operative vent management  - f/u cxr, abg  - full mechanical vent support - lung protective ventilation 6-8cc/kg Vt - VAP and PAD bundle in place  - titrate FiO2 to sat goal >92  - maintain peak/plats <30, driving pressures <84    Hypothyroidism  - home synthroid  88mcg daily   Hyperlipidemia  - f/u lipid panel  - rosuvastatin  10mg  daily   Best Practice (  right click and Reselect all SmartList Selections daily)   Diet/type: NPO DVT prophylaxis: systemic heparin  GI prophylaxis: H2B Lines: Central line, Arterial Line, and yes and it is still needed Foley:  Yes, and it is still needed Code Status:  full code Last date of multidisciplinary goals of care discussion [pending]  Labs   CBC: Recent Labs  Lab  10/13/23 1327  WBC 11.6*  NEUTROABS 9.9*  HGB 13.8  HCT 44.1  MCV 93.0  PLT 301    Basic Metabolic Panel: Recent Labs  Lab 10/13/23 1327  NA 138  K 4.0  CL 104  CO2 21*  GLUCOSE 85  BUN 23*  CREATININE 0.93  CALCIUM  9.4   GFR: Estimated Creatinine Clearance: 61.8 mL/min (by C-G formula based on SCr of 0.93 mg/dL). Recent Labs  Lab 10/13/23 1327  WBC 11.6*    Liver Function Tests: Recent Labs  Lab 10/13/23 1327  AST 27  ALT 30  ALKPHOS 147*  BILITOT 0.8  PROT 7.7  ALBUMIN  3.3*   No results for input(s): LIPASE, AMYLASE in the last 168 hours. No results for input(s): AMMONIA in the last 168 hours.  ABG    Component Value Date/Time   PHART 7.336 (L) 08/25/2018 1425   PCO2ART 43.5 08/25/2018 1425   PO2ART 199 (H) 08/25/2018 1425   HCO3 22.7 08/25/2018 1425   TCO2 17 (L) 11/28/2017 1255   ACIDBASEDEF 2.3 (H) 08/25/2018 1425   O2SAT 99.2 08/25/2018 1425     Coagulation Profile: No results for input(s): INR, PROTIME in the last 168 hours.  Cardiac Enzymes: No results for input(s): CKTOTAL, CKMB, CKMBINDEX, TROPONINI in the last 168 hours.  HbA1C: Hgb A1c MFr Bld  Date/Time Value Ref Range Status  01/26/2015 07:31 PM 6.2 (H) 4.8 - 5.6 % Final    Comment:    (NOTE)         Pre-diabetes: 5.7 - 6.4         Diabetes: >6.4         Glycemic control for adults with diabetes: <7.0     CBG: No results for input(s): GLUCAP in the last 168 hours.  Review of Systems:   As above  Past Medical History:  She,  has a past medical history of Hypothyroidism and Peripheral vascular disease (HCC).   Surgical History:   Past Surgical History:  Procedure Laterality Date   ABDOMINAL AORTOGRAM N/A 05/02/2016   Procedure: Abdominal Aortogram;  Surgeon: Penne Lonni Colorado, MD;  Location: [facility] INVASIVE CV LAB;  Service: Cardiovascular;  Laterality: N/A;   ABDOMINAL AORTOGRAM W/LOWER EXTREMITY N/A 11/28/2017   Procedure: ABDOMINAL AORTOGRAM  W/LOWER EXTREMITY;  Surgeon: Gretta Lonni PARAS, MD;  Location: [facility] INVASIVE CV LAB;  Service: Cardiovascular;  Laterality: N/A;  Bilatertal   AORTA - BILATERAL FEMORAL ARTERY BYPASS GRAFT Bilateral 08/25/2018   Procedure: AORTA BIFEMORAL BYPASS USING HEMASHIELD GOLD 14 x GRAFT;  Surgeon: Gretta Lonni PARAS, MD;  Location: [facility] OR;  Service: Vascular;  Laterality: Bilateral;   FEMORAL ARTERY EXPLORATION Bilateral 01/28/2015   Procedure: BILATERAL FEMORAL ARTERY EXPLORATION;  Surgeon: Krystal JULIANNA Doing, MD;  Location: [facility] OR;  Service: Vascular;  Laterality: Bilateral;   INSERTION OF ILIAC STENT Bilateral 01/28/2015   Procedure: INSERTION OF RIGHT AND LEFT ILIAC STENT;  Surgeon: Krystal JULIANNA Doing, MD;  Location: [facility] OR;  Service: Vascular;  Laterality: Bilateral;   LOWER EXTREMITY ANGIOGRAPHY N/A 05/02/2016   Procedure: Bilateral Illac Stent;  Surgeon: Penne Lonni Colorado, MD;  Location: [facility] INVASIVE  CV LAB;  Service: Cardiovascular;  Laterality: N/A;   PERIPHERAL VASCULAR CATHETERIZATION N/A 01/26/2015   Procedure: Abdominal Aortogram;  Surgeon: Krystal JULIANNA Doing, MD;  Location: [facility] INVASIVE CV LAB;  Service: Cardiovascular;  Laterality: N/A;   PERIPHERAL VASCULAR CATHETERIZATION N/A 04/19/2016   Procedure: Abdominal Aortogram w/Lower Extremity;  Surgeon: Redell LITTIE Door, MD;  Location: [facility] INVASIVE CV LAB;  Service: Cardiovascular;  Laterality: N/A;   PERIPHERAL VASCULAR INTERVENTION Bilateral 11/28/2017   Procedure: PERIPHERAL VASCULAR INTERVENTION;  Surgeon: Gretta Lonni PARAS, MD;  Location: [facility] INVASIVE CV LAB;  Service: Cardiovascular;  Laterality: Bilateral;  Iliac     Social History:   reports that she quit smoking about 9 years ago. She has never used smokeless tobacco. She reports that she does not drink alcohol  and does not use drugs.   Family History:  Her family history includes CAD in her father; Diabetes type II in her father.   Allergies Allergies  Allergen Reactions   Amoxicillin Shortness Of  Breath, Rash and Other (See Comments)    Panic attacks.      Home Medications  Prior to Admission medications   Medication Sig Start Date End Date Taking? Authorizing Provider  ACCU-CHEK AVIVA PLUS test strip TEST twice a day Patient not taking: Reported on 08/27/2023 11/08/12   Jayne Vonn DEL, MD  ACCU-CHEK SOFTCLIX LANCETS lancets TEST twice a day Patient not taking: Reported on 08/27/2023 11/08/12   Jayne Vonn DEL, MD  acetaminophen  (TYLENOL ) 500 MG tablet Take 1,000 mg by mouth 2 (two) times daily as needed for moderate pain.    [provider]  aspirin  EC 81 MG EC tablet Take 1 tablet (81 mg total) by mouth daily. 08/30/18   Bethanie Cough, PA-C  glucose monitoring kit (FREESTYLE) monitoring kit 1 each by Does not apply route as needed for other. Use as directed Patient not taking: Reported on 08/27/2023 10/08/12   Jayne Vonn DEL, MD  ibuprofen  (ADVIL ) 800 MG tablet Take 1 tablet (800 mg total) by mouth every 8 (eight) hours as needed. Take with food to prevent GI upset 07/06/22   Chandra Harlene LABOR, NP  levothyroxine  (SYNTHROID , LEVOTHROID) 75 MCG tablet Take 75 mcg by mouth daily before breakfast.    [provider]  rosuvastatin  (CRESTOR ) 10 MG tablet Take 10 mg by mouth at bedtime.     [provider]     Critical care time: 7    Tinnie FORBES Adolph DEVONNA [facility] Oct 15, 2023 2:09 AM  Please see [email or web address] for pager details.  From 7A-7P if no response, please call [phone] After hours, please call ELink [phone]

## 2023-10-25 NOTE — Progress Notes (Addendum)
 eLink Physician-Brief Progress Note Patient Name: Brianna Powers DOB: 16-Dec-1965 MRN: [id number]   Date of Service  11/13/2023  HPI/Events of Note  58 year old female with a history of peripheral vascular disease status post aortobifem bypass who was transferred for vascular surgical evaluation for left lower limb ischemia.  She was sent to the surgery where she was found to have thrombus occluding the entirety of the left lower extremity which was said to be foul-smelling in nature.  She was left intubated post unsuccessful revascularization and admitted to the ICU.  She is in shock requiring vasopressors.  eICU Interventions  Patient's chart reviewed.  Pertinent labs and imaging studies reviewed.  Video assessment of patient done. Impression: Left lower limb ischemia Infected left lower extremity thrombus Septic shock Postoperative vent dependence  Will be assessed for extubation once there are no further surgical interventions planned Broad-spectrum antibiotics Cultures pending Limb ischemia management per vascular surgery      Intervention Category Evaluation Type: New Patient Evaluation  Jerilynn Berg 11-13-2023, 1:37 AM  Addendum: Hypomagnesemia on a.m. labs. Replacement ordered.  6:14 AM.

## 2023-10-25 NOTE — Progress Notes (Signed)
 PHARMACY - ANTICOAGULATION CONSULT NOTE  Pharmacy Consult for heparin  Indication: atheroembolism  Allergies  Allergen Reactions   Amoxicillin Shortness Of Breath, Rash and Other (See Comments)    Panic attacks.     Patient Measurements: Height: 5' (152.4 cm) Weight: 76.7 kg (169 lb 1.5 oz) IBW/kg (Calculated) : 45.5 HEPARIN  DW (KG): 62.8  Vital Signs: Temp: 98.3 F (36.8 C) (07/20 1607) Temp Source: Oral (07/20 1607) BP: 92/73 (07/20 1740) Pulse Rate: 123 (07/20 1740)  Labs: Recent Labs    10/13/23 1327 10/13/23 2035 10/13/23 2207 10/13/23 2316 11-02-2023 0011  HGB 13.8   < > 5.4* 9.9* 10.2*  HCT 44.1   < > 16.0* 29.0* 30.0*  PLT 301  --   --   --   --   CREATININE 0.93  --   --   --   --   TROPONINIHS 5  --   --   --   --    < > = values in this interval not displayed.    Estimated Creatinine Clearance: 61.8 mL/min (by C-G formula based on SCr of 0.93 mg/dL).   Medical History: Past Medical History:  Diagnosis Date   Hypothyroidism    Peripheral vascular disease (HCC)     Medications:  Medications Prior to Admission  Medication Sig Dispense Refill Last Dose/Taking   ACCU-CHEK AVIVA PLUS test strip TEST twice a day (Patient not taking: Reported on 08/27/2023) 50 each PRN    ACCU-CHEK SOFTCLIX LANCETS lancets TEST twice a day (Patient not taking: Reported on 08/27/2023) 100 each PRN    acetaminophen  (TYLENOL ) 500 MG tablet Take 1,000 mg by mouth 2 (two) times daily as needed for moderate pain.      aspirin  EC 81 MG EC tablet Take 1 tablet (81 mg total) by mouth daily.      glucose monitoring kit (FREESTYLE) monitoring kit 1 each by Does not apply route as needed for other. Use as directed (Patient not taking: Reported on 08/27/2023) 1 each 0    ibuprofen  (ADVIL ) 800 MG tablet Take 1 tablet (800 mg total) by mouth every 8 (eight) hours as needed. Take with food to prevent GI upset 21 tablet 0    levothyroxine  (SYNTHROID , LEVOTHROID) 75 MCG tablet Take 75 mcg by mouth  daily before breakfast.      rosuvastatin  (CRESTOR ) 10 MG tablet Take 10 mg by mouth at bedtime.       Scheduled:   sodium chloride    Intravenous Once   aspirin  EC  81 mg Oral Q0600   ceFAZolin  1 g / gentamicin  80 mg in NS 500 mL surgical irrigation   Irrigation Once   [START ON 10/15/2023] docusate sodium   100 mg Oral Daily   Infusions:   sodium chloride      electrolyte-148     rifampin  (RIFADIN ) 600 mg in sodium chloride  0.9 % 100 mL IVPB      Assessment: 58yo female c/o back pain and chest pressure, CT concerning for occlusion in LLE >> vascular brought pt urgently to OR for embolectomy, now tx'd to ICU but still requires explant and ?AKA, to start heparin  for atheroembolism; pt rec'd a total of 20,000 units of heparin  boluses in OR as well as a heparinized irrigation, which may affect initial heparin  level(s).  Goal of Therapy:  Heparin  level 0.3-0.7 units/ml Monitor platelets by anticoagulation protocol: Yes   Plan:  Start heparin  infusion at 1100 units/hr. Monitor heparin  levels and CBC.  Marvetta Dauphin, PharmD, BCPS  2023-11-02,1:11  AM

## 2023-10-25 NOTE — Progress Notes (Signed)
 PCCM interval progress note:  Pt's pressor requirement and acidosis are worsening, family is en route, we will continue supportive care with bicarb and calcium  until everyone arrives and we can transition to comfort focused care.  Leita SAUNDERS Eldean Nanna, PA-C

## 2023-10-25 NOTE — Progress Notes (Signed)

## 2023-10-25 NOTE — Progress Notes (Signed)
 CT imaging reviewed from patient's presentation last night and discussed with Dr. Lanis.  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  Remains critically ill in the ICU with increasing acidosis and multiple pressor requirements.  Now on Levophed  and neo.  pH now 7.1 with severe metabolic acidosis.  Discussed with family that I do not think she is a good candidate for graft explantation and reconstruction for her infected aortobifemoral bypass with her critical illness.  Left leg also does not appear salvageable with mottling up to the thigh.  Agree with transition to comfort care and appreciate palliative care.  Lonni DOROTHA Gaskins, MD [facility] Office: [phone]   Lonni JINNY Gaskins

## 2023-10-25 NOTE — Consult Note (Signed)
 Consultation Note Date: Nov 08, 2023   Patient Name: Brianna Powers  DOB: [birth year]  MRN: [id number]  Age / Sex: 58 y.o., female  PCP: Shona Norleen PEDLAR, MD Referring Physician: Lanis Fonda BRAVO, MD  Reason for Consultation: Establishing goals of care  HPI/Patient Profile: 58 y.o. female  with past medical history of hypothyroidism, PVD, aortobifemoral bypass, admitted on 10/13/2023 with limb ischemia left lower extremity.   Patient initially seen at [facility] and transferred to [facility] for urgent surgical intervention and revascularization after consult by vascular surgery. CT angio showed no flow to left leg and in OR infected thrombus was visualized on the left, with thrombus on the right as well. Revascularization of LLE was unsuccessful and she has aortoenteric fistula. Unlikely to survive operation at this time; she is critically ill on pressors and intubated/sedated post-op. She will need left AKA pending GOC and is at risk of right AKA.  PMT has been consulted to assist with goals of care conversation.  Clinical Assessment and Goals of Care:  I have reviewed medical records including EPIC notes, labs and imaging, discussed with RN, assessed the patient and then at the bedside with patient's husband and daughter to discuss diagnosis prognosis, GOC, EOL wishes, disposition and options.  I introduced Palliative Medicine as specialized medical care for people living with serious illness. It focuses on providing relief from the symptoms and stress of a serious illness. The goal is to improve quality of life for both the patient and the family.  We discussed a brief life review of the patient and then focused on their current illness.  The natural disease trajectory and expectations at EOL were discussed.  I attempted to elicit values and goals of care important to the patient.    Medical History Review and Understanding:  We discussed patient's acute illness in the context  of their chronic comorbidities.  Patient's family understand the severity of patient's illness.  Social History: Patient and her husband have been married for 38 years.  They share 1 daughter together.  She is from [place].  She previously worked in [employer].  Code Status: Concepts specific to code status, artifical feeding and hydration, and rehospitalization were considered and discussed. Recommended consideration of DNR status, understanding evidenced-based poor outcomes in similar hospitalized patients, as the cause of the arrest is likely associated with chronic/terminal disease rather than a reversible acute cardio-pulmonary event.  Discussion: Patient's husband and daughter share that after they have had discussions together and processed the updates received from the medical team, they feel it is in the best interest of the patient to focus on her comfort and avoid prolonged suffering.  Given the options provided, she would not find her quality of life at a facility and living with amputations as a best case scenario to be acceptable.  She has already suffered enough.  Emotional support therapy listing was provided.   Counseled on the option of comfort focused care, explaining that patient would no longer receive aggressive medical interventions such as lab work, radiology testing, or medications not focused on comfort. All care would focus on how the patient is looking and feeling. This would include management of any symptoms that may cause discomfort, pain, shortness of breath, cough, nausea, agitation, anxiety, and/or secretions etc. Symptoms would be managed with medications and other non-pharmacological interventions such as spiritual support if requested, repositioning, music therapy, or therapeutic listening. Family verbalized understanding and appreciation.  They have been contacting family members that would like to come  say their goodbyes and understand that patient will  likely pass very quickly once life-prolonging interventions such as pressors and ventilator support are discontinued.  Patient's sister in [place]  is trying to come as soon as she can, her neighbor is coming today, as well as other family member from [employer]. They would like to continue with pressor support  and mechanical ventilation to allow time for patient's family to come see her, though do not find labs beneficial if we are not acting upon them.  They would also like to allow any comfort medications she needs in the meantime.  They are agreeable to a DNR in the meantime as well.   The difference between aggressive medical intervention and comfort care was considered in light of the patient's goals of care. Hospice and Palliative Care services outpatient were explained and offered.   Discussed the importance of continued conversation with family and the medical providers regarding overall plan of care and treatment options, ensuring decisions are within the context of the patient's values and GOCs.   Questions and concerns were addressed.  Hard Choices booklet left for review. The family was encouraged to call with questions or concerns.  PMT will continue to support holistically.   SUMMARY OF RECOMMENDATIONS   - CODE STATUS changed to DNR - Transition to comfort focused care; continue pressors and mechanical ventilation until family can come say goodbye in the coming days - Psychosocial and emotional support provided - Family would be open to spiritual care consult closer to the time of withdrawing life support - PMT will continue to follow and support  Prognosis:  Hours - Days  Discharge Planning: Anticipated Hospital Death      Primary Diagnoses: Present on Admission:  Acute lower limb ischemia  Critical limb ischemia of left lower extremity (HCC)    Physical Exam Vitals and nursing note reviewed.  Constitutional:      General: She is not in acute distress.     Appearance: She is ill-appearing.     Interventions: She is intubated.  Cardiovascular:     Rate and Rhythm: Normal rate.  Pulmonary:     Effort: Tachypnea present. She is intubated.  Neurological:     Comments: Sedated     Vital Signs: BP (!) 76/54   Pulse 71   Temp (!) 97.3 F (36.3 C) (Axillary)   Resp (!) 26   Ht 5' (1.524 m)   Wt 83 kg   SpO2 100%   BMI 35.74 kg/m  Pain Scale: CPOT   Pain Score: 6    SpO2: SpO2: 100 % O2 Device:SpO2: 100 % O2 Flow Rate: .    Palliative Assessment/Data: 10%    MDM: high   Fed Ceci SHAUNNA Fell, PA-C  Palliative Medicine Team Team phone # [phone]  Thank you for allowing the Palliative Medicine Team to assist in the care of this patient. Please utilize secure chat with additional questions, if there is no response within 30 minutes please call the above phone number.  Palliative Medicine Team providers are available by phone from 7am to 7pm daily and can be reached through the team cell phone.  Should this patient require assistance outside of these hours, please call the patient's attending physician.

## 2023-10-25 NOTE — Death Summary Note (Signed)
 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 Acute respiratory failure with hypoxia Hypothyroidism Hyperlipidemia Acute metabolic acidosis Acute kidney injury Obesity DNR status  Diagnoses  Preliminary cause of death: Withdrawal of care in the setting of multisystem organ failure Secondary Diagnoses (including complications and co-morbidities):  Principal Problem:   Acute lower limb ischemia Active Problems:   Critical limb ischemia of left lower extremity (HCC)   Arterial occlusion, lower extremity [facility])   Brief Hospital Course (including significant findings, care, treatment, and services provided and events leading to death)  Brianna Powers is a 58 y.o. year old female who with past medical history of hypothyroidism, peripheral vascular disease, aortobifemoral bypass who presents as transfer from [facility] for left lower limb ischemia. Noted to have LLE numbness and pain for the past few weeks. She had CT angio Ao+Bifem in the ED showing no contrast in the graft concerning for occlusion, no discernible runoff into the LLE. Labs with WBC 11.6, CMP okay with mild elevation in BUN to 23. Dr. Lanis with vascular surgery was consulted and patient transferred to [facility] for intervention. She was taken urgently to surgery in which foul-smelling thrombus was found in the entirety of the left lower extremity. They were not able to re-establish outlfow into the  left leg and she was transferred to ICU post-operative intubated, ccm consulted   Patient was admitted to ICU, noted to have acute left lower extremity ischemia, with rising lactate and worsening serum creatinine, she required multiple vasopressor support due to septic shock She was going into multisystem organ failure, goals of care discussions carried with family, patient's family decided to proceed with DNR first and then decided to proceed with comfort care.  Comfort care orders written, patient passed on 2023-10-27 at 6 PM.  Patient's family was informed  Pertinent Labs and Studies  Significant Diagnostic Studies DG Abd Portable 1V Result Date: 10/27/2023 CLINICAL DATA:  NG tube position. EXAM: PORTABLE ABDOMEN - 1 VIEW COMPARISON:  08/25/2018 FINDINGS: NG tube tip is positioned in the mid stomach with proximal side port below the GE junction. Nonspecific bowel gas pattern. IMPRESSION: NG tube tip is positioned in the mid stomach. Electronically Signed   By: Camellia Candle M.D.   On: 10/27/23 09:04   Portable Chest x-ray Result Date: 10/27/2023 EXAM: 1 VIEW XRAY OF THE CHEST 27-Oct-2023 01:42:01 AM COMPARISON: CT chest dated October 26, 2023. CLINICAL HISTORY: [id number] Endotracheal tube present [id number]. Table formatting from the original note was not included.; ETT, NGT placement post ; BILATERAL FEMORAL ARTERY THROMBECTOMY ; PACTH ANGIOPLASTY BILATERAL FEMORAL FINDINGS: LUNGS AND PLEURA: No focal pulmonary opacity. No pulmonary edema. No pleural effusion. No pneumothorax. HEART AND MEDIASTINUM: No acute abnormality of the cardiac and mediastinal silhouettes. BONES AND SOFT TISSUES: No acute osseous abnormality. LINES AND TUBES: Endotracheal tube is 2 cm above the carina. Malpositioned enteric tube which is looped in the stomach and courses caudally back into the esophagus, terminating in the upper esophagus. Withdrawal and replacement is suggested. Left IJ venous sheath  in place. IMPRESSION: 1.  Malpositioned enteric tube, looped in the stomach and terminating in the upper esophagus. Withdrawal and replacement is suggested. Electronically signed by: Pinkie Pebbles MD 10-19-23 01:46 AM EDT RP Workstation: [id number]   MR Lumbar Spine Wo Contrast Result Date: 10/13/2023 CLINICAL DATA:  Provided history: Low back pain, cauda equina syndrome suspected. EXAM: MRI LUMBAR SPINE WITHOUT CONTRAST TECHNIQUE: Multiplanar, multisequence MR imaging of the lumbar spine was performed. No intravenous contrast was administered. COMPARISON:  Same day CT abdomen/pelvis 10/13/2023. FINDINGS: The patient was unable to tolerate the full examination. As a result, a routine axial T1 sequence could not be acquired. Additionally, the axial T2 and sagittal STIR sequences are severely motion degraded. Within these limitations, findings are as follows. Segmentation: 5 lumbar vertebrae. The caudal most well-formed intervertebral disc space is designated L5-S1 Alignment:  Slight L1-L2 grade 1 retrolisthesis. Vertebrae: No lumbar vertebral compression fracture. Within described limitations, no significant marrow edema or focal worrisome marrow lesion is identified. T11 vertebral body hemangioma. Conus medullaris and cauda equina: Conus extends to the L1-L2 level. No signal abnormality identified within the visualized distal spinal cord. Paraspinal and other soft tissues: No paraspinal mass or collection. Disc levels: Mild-to-moderate disc degeneration at T11-T12. No more than mild disc degeneration at the remaining lumbar and visualized lower thoracic levels. T10-T11: This level is imaged in the sagittal plane only. No significant disc herniation or stenosis. T11-T12: This level is imaged in the sagittal plane only. Small central disc protrusion. No significant spinal canal or foraminal stenosis. T12-L1: No significant disc herniation or stenosis. L1-L2: Mild grade 1 retrolisthesis. No significant disc herniation or stenosis.  L2-L3: No significant disc herniation or stenosis. L3-L4: Small central disc protrusion. No significant spinal canal or foraminal stenosis. L4-L5: No significant disc herniation or stenosis. L5-S1: No significant disc herniation or stenosis. IMPRESSION: 1. Prematurely terminated and significantly motion degraded examination. Within this limitation, findings are as follows. 2. Small central disc protrusions at T11-T12 and L3-L4. No significant spinal canal or foraminal stenosis at the lumbar or visualized lower thoracic levels. Disc degeneration is greatest at T11-T12 (mild-to-moderate mild-to-moderate at this level). Electronically Signed   By: Rockey Childs D.O.   On: 10/13/2023 18:07   CT ANGIO AO+BIFEM W & OR WO CONTRAST Result Date: 10/13/2023 CLINICAL DATA:  Chest and back pain. Acute aortic syndrome suspected. EXAM: CT ANGIOGRAPHY CHEST, ABDOMEN AND PELVIS TECHNIQUE: Multidetector CT imaging through the chest, abdomen and pelvis was performed using the standard protocol during bolus administration of intravenous contrast. Multiplanar reconstructed images and MIPs were obtained and reviewed to evaluate the vascular anatomy. RADIATION DOSE REDUCTION: This exam was performed according to the departmental dose-optimization program which includes automated exposure control, adjustment of the mA and/or kV according to patient size and/or use of iterative reconstruction technique. CONTRAST:  OMNIPAQUE  IOHEXOL  350 MG/ML SOLN COMPARISON:  06/13/2018 FINDINGS: CTA CHEST FINDINGS Cardiovascular: The heart is normal in size. No pericardial effusion. The aorta is normal in caliber. No dissection. The branch vessels are patent. The pulmonary arteries are grossly normal. Mediastinum/Nodes: No mediastinal or hilar mass or lymphadenopathy. The esophagus is unremarkable. Lungs/Pleura: No acute pulmonary findings. No infiltrates, edema or effusions. No pulmonary lesions. The central tracheobronchial tree is unremarkable.  Musculoskeletal: No significant bony findings. Review of the MIP images confirms the above findings. CTA ABDOMEN AND PELVIS FINDINGS VASCULAR Aorta: Surgical changes from aorto bi femoral bypass graft. No contrast is seen in the graft suggesting occlusion. The right iliac artery is  reconstituted from the native aorta. Minimal reconstitution of the left internal iliac artery. Gas noted in the native aorta remit suggesting possible infection. I do not see an obvious communication with the bowel but a fistula is possible also. Celiac: Normal SMA: Normal Renals: The right renal arteries normal. Small irregular and stenotic left renal artery with associated severe left renal atrophy. IMA: Not identified and likely chronically occluded. Inflow: Reconstituted right common iliac artery PA collateral from the native aorta. Only minimal contrast seen in the left external carotid artery. Reconstitution of the left internal femoral artery distally. No flow in the left common femoral artery proximally. Minimal flow is noted more distally and there is flow in the profundus femoral artery. No discernible runoff in the lower extremity. Three-vessel runoff noted in the right lower extremity. The right common femoral artery is patent. Veins: No significant findings. Review of the MIP images confirms the above findings. NON-VASCULAR Hepatobiliary: Geographic fatty infiltration. No lesions are identified. No biliary dilatation. Pancreas: Diffuse fatty change but no mass or acute inflammation. Spleen: Choose 1 Adrenals/Urinary Tract: The adrenal glands are normal. The right kidney is normal. Severe atrophy of the left kidney. The bladder is unremarkable. Stomach/Bowel: The stomach, duodenum, small bowel and colon are grossly normal without oral contrast. No inflammatory changes, mass lesions or obstructive findings. The appendix is normal. Lymphatic: No adenopathy. Reproductive: The uterus and ovaries are unremarkable. Other: No free  air or free fluid. Musculoskeletal: No significant bony findings. Review of the MIP images confirms the above findings. IMPRESSION: 1. Surgical changes from aorto bifemoral bypass graft. No contrast is seen in the graft suggesting occlusion. Gas noted in the native aorta remit suggesting possible infection. I do not see an obvious communication with the bowel but a fistula is possible also. 2. Reconstituted right common iliac artery from the native aorta. 3. Reconstitution of the left internal iliac artery distally. 4. Minimal contrast in the left external iliac and left common femoral arteries. There is flow in the profundus femoral but no discernible runoff into the left lower extremity. 5. Three-vessel runoff in the right lower extremity. 6. No acute pulmonary findings. 7. Geographic fatty infiltration of the liver. 8. Severe atrophy of the left kidney. Electronically Signed   By: MYRTIS Stammer M.D.   On: 10/13/2023 16:28   CT ANGIO CHEST AORTA W/CM &/OR WO/CM Result Date: 10/13/2023 CLINICAL DATA:  Chest and back pain. Acute aortic syndrome suspected. EXAM: CT ANGIOGRAPHY CHEST, ABDOMEN AND PELVIS TECHNIQUE: Multidetector CT imaging through the chest, abdomen and pelvis was performed using the standard protocol during bolus administration of intravenous contrast. Multiplanar reconstructed images and MIPs were obtained and reviewed to evaluate the vascular anatomy. RADIATION DOSE REDUCTION: This exam was performed according to the departmental dose-optimization program which includes automated exposure control, adjustment of the mA and/or kV according to patient size and/or use of iterative reconstruction technique. CONTRAST:  OMNIPAQUE  IOHEXOL  350 MG/ML SOLN COMPARISON:  06/13/2018 FINDINGS: CTA CHEST FINDINGS Cardiovascular: The heart is normal in size. No pericardial effusion. The aorta is normal in caliber. No dissection. The branch vessels are patent. The pulmonary arteries are grossly normal.  Mediastinum/Nodes: No mediastinal or hilar mass or lymphadenopathy. The esophagus is unremarkable. Lungs/Pleura: No acute pulmonary findings. No infiltrates, edema or effusions. No pulmonary lesions. The central tracheobronchial tree is unremarkable. Musculoskeletal: No significant bony findings. Review of the MIP images confirms the above findings. CTA ABDOMEN AND PELVIS FINDINGS VASCULAR Aorta: Surgical changes from aorto bi femoral bypass  graft. No contrast is seen in the graft suggesting occlusion. The right iliac artery is reconstituted from the native aorta. Minimal reconstitution of the left internal iliac artery. Gas noted in the native aorta remit suggesting possible infection. I do not see an obvious communication with the bowel but a fistula is possible also. Celiac: Normal SMA: Normal Renals: The right renal arteries normal. Small irregular and stenotic left renal artery with associated severe left renal atrophy. IMA: Not identified and likely chronically occluded. Inflow: Reconstituted right common iliac artery PA collateral from the native aorta. Only minimal contrast seen in the left external carotid artery. Reconstitution of the left internal femoral artery distally. No flow in the left common femoral artery proximally. Minimal flow is noted more distally and there is flow in the profundus femoral artery. No discernible runoff in the lower extremity. Three-vessel runoff noted in the right lower extremity. The right common femoral artery is patent. Veins: No significant findings. Review of the MIP images confirms the above findings. NON-VASCULAR Hepatobiliary: Geographic fatty infiltration. No lesions are identified. No biliary dilatation. Pancreas: Diffuse fatty change but no mass or acute inflammation. Spleen: Choose 1 Adrenals/Urinary Tract: The adrenal glands are normal. The right kidney is normal. Severe atrophy of the left kidney. The bladder is unremarkable. Stomach/Bowel: The stomach,  duodenum, small bowel and colon are grossly normal without oral contrast. No inflammatory changes, mass lesions or obstructive findings. The appendix is normal. Lymphatic: No adenopathy. Reproductive: The uterus and ovaries are unremarkable. Other: No free air or free fluid. Musculoskeletal: No significant bony findings. Review of the MIP images confirms the above findings. IMPRESSION: 1. Surgical changes from aorto bifemoral bypass graft. No contrast is seen in the graft suggesting occlusion. Gas noted in the native aorta remit suggesting possible infection. I do not see an obvious communication with the bowel but a fistula is possible also. 2. Reconstituted right common iliac artery from the native aorta. 3. Reconstitution of the left internal iliac artery distally. 4. Minimal contrast in the left external iliac and left common femoral arteries. There is flow in the profundus femoral but no discernible runoff into the left lower extremity. 5. Three-vessel runoff in the right lower extremity. 6. No acute pulmonary findings. 7. Geographic fatty infiltration of the liver. 8. Severe atrophy of the left kidney. Electronically Signed   By: MYRTIS Stammer M.D.   On: 10/13/2023 16:28    Microbiology Recent Results (from the past 240 hours)  Aerobic/Anaerobic Culture w Gram Stain (surgical/deep wound)     Status: None (Preliminary result)   Collection Time: 10/13/23  9:26 PM   Specimen: Leg, Left; Tissue  Result Value Ref Range Status   Specimen Description OTHER  Final   Special Requests LL  Final   Gram Stain   Final    RARE WBC PRESENT, PREDOMINANTLY PMN MODERATE GRAM NEGATIVE RODS MODERATE GRAM POSITIVE COCCI RARE GRAM POSITIVE RODS Performed at [facility], [address] [place], [place] [address]    Culture PENDING  Incomplete   Report Status PENDING  Incomplete  MRSA Next Gen by PCR, Nasal     Status: None   Collection Time: 28-Oct-2023  2:35 AM   Specimen: Nasal Mucosa; Nasal Swab   Result Value Ref Range Status   MRSA by PCR Next Gen NOT DETECTED NOT DETECTED Final    Comment: (NOTE) The GeneXpert MRSA Assay (FDA approved for NASAL specimens only), is one component of a comprehensive MRSA colonization surveillance program. It is  not intended to diagnose MRSA infection nor to guide or monitor treatment for MRSA infections. Test performance is not FDA approved in patients less than 76 years old. Performed at [facility], [address], [place], [place] [address]   Culture, blood (Routine X 2) w Reflex to ID Panel     Status: None (Preliminary result)   Collection Time: Oct 19, 2023  5:09 AM   Specimen: BLOOD  Result Value Ref Range Status   Specimen Description BLOOD BLOOD RIGHT HAND  Final   Special Requests   Final    BOTTLES DRAWN AEROBIC ONLY Blood Culture results may not be optimal due to an inadequate volume of blood received in culture bottles   Culture   Final    NO GROWTH 1 DAY Performed at [facility], [address], [place], [place] [address]    Report Status PENDING  Incomplete    Lab Basic Metabolic Panel: Recent Labs  Lab 10/13/23 1327 10/13/23 2035 10-19-2023 0011 2023-10-19 0213 October 19, 2023 0235 10-19-2023 1035 Oct 19, 2023 1601  NA 138   < > 140 138 140 135 135  K 4.0   < > 4.6 4.7 5.1 5.8* 6.5*  CL 104  --   --   --  104  --   --   CO2 21*  --   --   --  20*  --   --   GLUCOSE 85  --   --   --  184*  --   --   BUN 23*  --   --   --  20  --   --   CREATININE 0.93  --   --   --  1.10*  --   --   CALCIUM  9.4  --   --   --  9.2  --   --   MG  --   --   --   --  1.7  --   --    < > = values in this interval not displayed.   Liver Function Tests: Recent Labs  Lab 10/13/23 1327  AST 27  ALT 30  ALKPHOS 147*  BILITOT 0.8  PROT 7.7  ALBUMIN  3.3*   No results for input(s): LIPASE, AMYLASE in the last 168 hours. No results for input(s): AMMONIA in the last 168 hours. CBC: Recent Labs  Lab 10/13/23 1327 10/13/23 2035  2023-10-19 0011 October 19, 2023 0213 2023/10/19 0235 2023/10/19 1035 Oct 19, 2023 1601  WBC 11.6*  --   --   --  38.4*  --   --   NEUTROABS 9.9*  --   --   --   --   --   --   HGB 13.8   < > 10.2* 12.2 12.2 7.8* 5.1*  HCT 44.1   < > 30.0* 36.0 34.9* 23.0* 15.0*  MCV 93.0  --   --   --  88.1  --   --   PLT 301  --   --   --  141*  --   --    < > = values in this interval not displayed.   Cardiac Enzymes: No results for input(s): CKTOTAL, CKMB, CKMBINDEX, TROPONINI in the last 168 hours. Sepsis Labs: Recent Labs  Lab 10/13/23 1327 19-Oct-2023 0235  WBC 11.6* 38.4*    Procedures/Operations     Andilynn Delavega 10/15/2023, 8:53 AM

## 2023-10-25 NOTE — Progress Notes (Signed)
 OT Cancellation Note  Patient Details Name: Brianna Powers MRN: [id number] DOB: 06/28/1965   Cancelled Treatment:    Reason Eval/Treat Not Completed: Patient not medically ready. Pt with reocclusion and awaiting plan prior to initiation of OT. Will sign off and await new order as pt appropriate.   Elma JONETTA Lebron FREDERICK, OTR/L [facility] Acute Rehabilitation Office: [phone]   Elma JONETTA Lebron 02-Nov-2023, 8:01 AM

## 2023-10-25 NOTE — Progress Notes (Signed)
 Pharmacy Antibiotic Note  Brianna Powers is a 58 y.o. female admitted on 10/13/2023 with infected thrombus and aortobifemoral graft infection.  Pharmacy has been consulted for vancomcyin dosing.  Plan: Vancomycin  750mg  IV Q24H. Goal AUC 400-550.  Expected AUC 440.   Height: 5' (152.4 cm) Weight: 76.7 kg (169 lb 1.5 oz) IBW/kg (Calculated) : 45.5  Temp (24hrs), Avg:98 F (36.7 C), Min:97.6 F (36.4 C), Max:98.3 F (36.8 C)  Recent Labs  Lab 10/13/23 1327  WBC 11.6*  CREATININE 0.93    Estimated Creatinine Clearance: 61.8 mL/min (by C-G formula based on SCr of 0.93 mg/dL).    Allergies  Allergen Reactions   Amoxicillin Shortness Of Breath, Rash and Other (See Comments)    Panic attacks.     Thank you for allowing pharmacy to be a part of this patient's care.  Marvetta Dauphin, PharmD, BCPS  10/29/2023 2:27 AM

## 2023-10-25 NOTE — Anesthesia Postprocedure Evaluation (Signed)
 Anesthesia Post Note  Patient: Brianna Powers  Procedure(s) Performed: BILATERAL FEMORAL ARTERY THROMBECTOMY (Bilateral: Groin) PACTH ANGIOPLASTY BILATERAL FEMORAL ARTERY, USING HEMASHIELD PATCH (Bilateral: Groin) FOUR COMPARTMENT LEFT LOWER FASCIOTOMY (Left: Leg Lower) BILATERAL GROIN APPLICATION, WOUND VAC (Bilateral: Groin)     Patient location during evaluation: SICU Anesthesia Type: General Level of consciousness: sedated and patient remains intubated per anesthesia plan Pain management: pain level controlled Vital Signs Assessment: post-procedure vital signs reviewed and stable Respiratory status: patient remains intubated per anesthesia plan and patient on ventilator - see flowsheet for VS Anesthetic complications: no   No notable events documented.  Last Vitals:  Vitals:   11-09-2023 1658 2023-11-09 1800  BP:    Pulse:    Resp: (!) 22 (!) 0  Temp:    SpO2:      Last Pain:  Vitals:   09-Nov-2023 1137  TempSrc: Axillary  PainSc:                  Brianna Powers

## 2023-10-25 NOTE — H&P (Signed)
 Hospital Consult    Reason for Consult: Left lower extremity acute limb ischemia Requesting Physician: ED MRN #:  [id number]  History of Present Illness: This is a 58 y.o. female with history of aortobifemoral bypass who presents with limb ischemia left lower extremity.  She transferred down from [facility]  On exam, Brianna Powers was very uncomfortable.  She had to be limb ischemia in the left lower extremity.  Stated this started today, with accompanying back pain.  The back pain is since stopped.  Denies issues in the right lower extremity. Denies fevers, denies chills, denies bloody bowel movements.   Past Medical History:  Diagnosis Date   Hypothyroidism    Peripheral vascular disease (HCC)     Past Surgical History:  Procedure Laterality Date   ABDOMINAL AORTOGRAM N/A 05/02/2016   Procedure: Abdominal Aortogram;  Surgeon: Penne Lonni Colorado, MD;  Location: [facility] INVASIVE CV LAB;  Service: Cardiovascular;  Laterality: N/A;   ABDOMINAL AORTOGRAM W/LOWER EXTREMITY N/A 11/28/2017   Procedure: ABDOMINAL AORTOGRAM W/LOWER EXTREMITY;  Surgeon: Gretta Lonni PARAS, MD;  Location: [facility] INVASIVE CV LAB;  Service: Cardiovascular;  Laterality: N/A;  Bilatertal   AORTA - BILATERAL FEMORAL ARTERY BYPASS GRAFT Bilateral 08/25/2018   Procedure: AORTA BIFEMORAL BYPASS USING HEMASHIELD GOLD 14 x GRAFT;  Surgeon: Gretta Lonni PARAS, MD;  Location: [facility] OR;  Service: Vascular;  Laterality: Bilateral;   FEMORAL ARTERY EXPLORATION Bilateral 01/28/2015   Procedure: BILATERAL FEMORAL ARTERY EXPLORATION;  Surgeon: Krystal JULIANNA Doing, MD;  Location: [facility] OR;  Service: Vascular;  Laterality: Bilateral;   INSERTION OF ILIAC STENT Bilateral 01/28/2015   Procedure: INSERTION OF RIGHT AND LEFT ILIAC STENT;  Surgeon: Krystal JULIANNA Doing, MD;  Location: [facility] OR;  Service: Vascular;  Laterality: Bilateral;   LOWER EXTREMITY ANGIOGRAPHY N/A 05/02/2016   Procedure: Bilateral Illac Stent;  Surgeon: Penne Lonni Colorado, MD;   Location: [facility] INVASIVE CV LAB;  Service: Cardiovascular;  Laterality: N/A;   PERIPHERAL VASCULAR CATHETERIZATION N/A 01/26/2015   Procedure: Abdominal Aortogram;  Surgeon: Krystal JULIANNA Doing, MD;  Location: [facility] INVASIVE CV LAB;  Service: Cardiovascular;  Laterality: N/A;   PERIPHERAL VASCULAR CATHETERIZATION N/A 04/19/2016   Procedure: Abdominal Aortogram w/Lower Extremity;  Surgeon: Redell LITTIE Door, MD;  Location: [facility] INVASIVE CV LAB;  Service: Cardiovascular;  Laterality: N/A;   PERIPHERAL VASCULAR INTERVENTION Bilateral 11/28/2017   Procedure: PERIPHERAL VASCULAR INTERVENTION;  Surgeon: Gretta Lonni PARAS, MD;  Location: [facility] INVASIVE CV LAB;  Service: Cardiovascular;  Laterality: Bilateral;  Iliac    Allergies  Allergen Reactions   Amoxicillin Shortness Of Breath, Rash and Other (See Comments)    Panic attacks.     Prior to Admission medications   Medication Sig Start Date End Date Taking? Authorizing Provider  ACCU-CHEK AVIVA PLUS test strip TEST twice a day Patient not taking: Reported on 08/27/2023 11/08/12   Jayne Vonn DEL, MD  ACCU-CHEK SOFTCLIX LANCETS lancets TEST twice a day Patient not taking: Reported on 08/27/2023 11/08/12   Jayne Vonn DEL, MD  acetaminophen  (TYLENOL ) 500 MG tablet Take 1,000 mg by mouth 2 (two) times daily as needed for moderate pain.    [provider]  aspirin  EC 81 MG EC tablet Take 1 tablet (81 mg total) by mouth daily. 08/30/18   Bethanie Cough, PA-C  glucose monitoring kit (FREESTYLE) monitoring kit 1 each by Does not apply route as needed for other. Use as directed Patient not taking: Reported on 08/27/2023 10/08/12   Jayne Vonn DEL, MD  ibuprofen  (ADVIL )  800 MG tablet Take 1 tablet (800 mg total) by mouth every 8 (eight) hours as needed. Take with food to prevent GI upset 07/06/22   Chandra Harlene LABOR, NP  levothyroxine  (SYNTHROID , LEVOTHROID) 75 MCG tablet Take 75 mcg by mouth daily before breakfast.    [provider]  rosuvastatin  (CRESTOR ) 10 MG tablet  Take 10 mg by mouth at bedtime.     [provider]    Social History   Socioeconomic History   Marital status: Married    Spouse name: Not on file   Number of children: Not on file   Years of education: Not on file   Highest education level: Not on file  Occupational History   Not on file  Tobacco Use   Smoking status: Former    Current packs/day: 0.00    Types: Cigarettes    Quit date: 05/25/2014    Years since quitting: 9.3   Smokeless tobacco: Never  Vaping Use   Vaping status: Never Used  Substance and Sexual Activity   Alcohol  use: No    Alcohol /week: 0.0 standard drinks of alcohol    Drug use: No   Sexual activity: Not on file  Other Topics Concern   Not on file  Social History Narrative   Not on file   Social Drivers of Health   Financial Resource Strain: Not on file  Food Insecurity: Not on file  Transportation Needs: Not on file  Physical Activity: Not on file  Stress: Not on file  Social Connections: Not on file  Intimate Partner Violence: Not on file   Family History  Problem Relation Age of Onset   CAD Father    Diabetes type II Father     ROS: Otherwise negative unless mentioned in HPI  Physical Examination  Vitals:   2023-10-15 0800 October 15, 2023 0814  BP:    Pulse:    Resp: (!) 26   Temp:  (!) 97.3 F (36.3 C)  SpO2:     Body mass index is 35.74 kg/m.  General:  WDWN in NAD Gait: Not observed HENT: WNL, normocephalic Pulmonary: normal non-labored breathing, without Rales, rhonchi,  wheezing Cardiac: Tachycardia Abdomen:  soft, NT/ND, no masses, midline incision from previous aortobifemoral bypass Skin: without rashes Vascular Exam/Pulses: Nonpalpable pulses bilaterally, right foot warm.  Doppler unavailable, will check in the operating room.  Left foot with both sensory and motor deficits Extremities: with ischemic changes, without Gangrene , without cellulitis; without open wounds;  Musculoskeletal: no muscle wasting or  atrophy  Neurologic: A&O X 3; in pain Psychiatric: In pain, Lymph:  Unremarkable  CBC    Component Value Date/Time   WBC 38.4 (H) Oct 15, 2023 0235   RBC 3.96 15-Oct-2023 0235   HGB 12.2 10-15-23 0235   HCT 34.9 (L) 10/15/23 0235   PLT 141 (L) Oct 15, 2023 0235   MCV 88.1 Oct 15, 2023 0235   MCH 30.8 Oct 15, 2023 0235   MCHC 35.0 10/15/23 0235   RDW 14.0 10/15/23 0235   LYMPHSABS 1.2 10/13/2023 1327   MONOABS 0.1 10/13/2023 1327   EOSABS 0.0 10/13/2023 1327   BASOSABS 0.1 10/13/2023 1327    BMET    Component Value Date/Time   NA 140 10-15-23 0235   K 5.1 Oct 15, 2023 0235   CL 104 2023/10/15 0235   CO2 20 (L) October 15, 2023 0235   GLUCOSE 184 (H) 10-15-2023 0235   BUN 20 15-Oct-2023 0235   CREATININE 1.10 (H) 10/15/2023 0235   CALCIUM  9.2 10/15/23 0235   GFRNONAA 59 (L)  Oct 27, 2023 0235   GFRAA >60 08/29/2018 0317    COAGS: Lab Results  Component Value Date   INR 1.3 (H) 08/25/2018   INR 1.0 08/19/2018   INR 1.06 01/26/2015     ASSESSMENT/PLAN: This is a 58 y.o. female with 6-hour history of left lower extremity limb ischemia.  This is now to be limb ischemia and requires urgent revascularization.  Upon evaluation of the CT scan, there is thrombus in the right limb as well as the right common femoral artery.  I am worried that attempted Fogarty embolectomy would likely cause more thrombus to embolize into the right lower extremity and cause right sided limb ischemia.  Patient needs bilateral common femoral artery cutdown, embolectomies. Likely source is cardioembolic. I am unsure as to whether the graft is infected, however this will not be dealt with in this operation as this would require explant which comes with even more morbidity mortality.  The goal of the operation today is to revascularize the legs and remove thrombus from the limbs.  After discussing the risks and benefits of the above, including left lower extremity fasciotomy, Villa and her husband elected  to proceed.     Fonda FORBES Rim MD MS [facility] [phone] 10-27-2023  8:48 AM

## 2023-10-25 NOTE — Progress Notes (Addendum)
 Progress Note    10/21/23 7:58 AM 1 Day Post-Op  Subjective:  intubated and sedated   Vitals:   10/21/2023 0600 2023-10-21 0740  BP:    Pulse:    Resp: (!) 22 (!) 25  Temp:    SpO2:     Physical Exam: Lungs:  mechanical ventilation Incisions:  groin incisions with vac in place; good seal; LLE compression wrap without break through bleeding Extremities:  mottled LLE up to proximal thigh; dusky R foot; R PT and AT at proximal ankle Abdomen:  soft Neurologic: sedated  CBC    Component Value Date/Time   WBC 38.4 (H) 2023/10/21 0235   RBC 3.96 Oct 21, 2023 0235   HGB 12.2 10/21/2023 0235   HCT 34.9 (L) 2023/10/21 0235   PLT 141 (L) 10-21-23 0235   MCV 88.1 10/21/23 0235   MCH 30.8 Oct 21, 2023 0235   MCHC 35.0 Oct 21, 2023 0235   RDW 14.0 October 21, 2023 0235   LYMPHSABS 1.2 10/13/2023 1327   MONOABS 0.1 10/13/2023 1327   EOSABS 0.0 10/13/2023 1327   BASOSABS 0.1 10/13/2023 1327    BMET    Component Value Date/Time   NA 140 Oct 21, 2023 0235   K 5.1 10-21-23 0235   CL 104 October 21, 2023 0235   CO2 20 (L) Oct 21, 2023 0235   GLUCOSE 184 (H) 2023/10/21 0235   BUN 20 10/21/23 0235   CREATININE 1.10 (H) 21-Oct-2023 0235   CALCIUM  9.2 10-21-23 0235   GFRNONAA 59 (L) 21-Oct-2023 0235   GFRAA >60 08/29/2018 0317    INR    Component Value Date/Time   INR 1.3 (H) 08/25/2018 1413     Intake/Output Summary (Last 24 hours) at 10-21-2023 0758 Last data filed at 10/21/2023 0600 Gross per 24 hour  Intake 8337.05 ml  Output 3525 ml  Net 4812.05 ml     Assessment/Plan:  58 y.o. female is s/p Bilateral common femoral artery reexposure greater than 30 days Bilateral iliofemoral, Profunda, superficial femoral, popliteal artery embolectomy Bilateral common femoral artery Rifampin  soaked dacryon patch angioplasty Left sided dorsalis pedis artery exposure Anterior tibial artery, dorsalis pedis artery embolectomy Left sided posterior tibial artery exposure Posterior tibial  artery, plantar artery embolectomy 4 compartment fasciotomy of the left lower extremity Bilateral groin vacuum dressings 1 Day Post-Op   Patient remains critically ill on multiple pressors.  She is intubated and sedated Groin incisions with wound vacs in place, no obvious bleeding LLE is mottled up to the proximal thigh.  She will require L AKA.  R foot dusky with soft PT and AT at the proximal ankle only.  She is at risk for major R leg amputation as well.  Continue IV heparin  for now.  She will require explantation of infected aortobifemoral bypass if family is willing to proceed.  Continue broad spectrum IV antibiotics; cultures are pending.  Appreciate critical care involvement.  Palliative has also been consulted for  goals of care.   Donnice Sender, PA-C [facility] [phone] Oct 21, 2023 7:58 AM  VASCULAR STAFF ADDENDUM: I have independently interviewed and examined the patient. I agree with the above.  Patient is critical.  She has an aortoenteric fistula, left lower extremity limb ischemia which I was unsuccessful in treating last night. While she continues to have urine output, her creatinine is worsening due to myonecrosis. I discussed her case with Dr. Gretta, specifically the left lower extremity limb ischemia and aortoenteric fistula.   I do not think she would survive an operation for explant at this time, especially  with the left lower extremity ischemia with mottling through the thigh.  I plan on discussing this with family when they arrive to the hospital today. Recommend palliative care discussions. Will continue current   Fonda FORBES Rim MD [facility] Phone Number: [phone] 2023/10/24 8:50 AM

## 2023-10-25 NOTE — Progress Notes (Signed)
~  0100: Pt arrival to 2H. Absent DP & PT pulses in LLE when using doppler to assess perfusion, limb is cyanotic and mottled. OR staff & CRNA at bedside. Vascular surgeon Dr. Lanis notified, and he arrived shortly after to the bedside to evaluate. Vascular & CCM is aware. I will continue to assess and monitor.

## 2023-10-25 NOTE — Progress Notes (Signed)
 NAME:  Brianna Powers, MRN:  [id number], DOB:  [birth year], LOS: 1 ADMISSION DATE:  10/13/2023, CONSULTATION DATE:  11/02/2023 REFERRING MD:  Lanis, vascular surgeon CHIEF COMPLAINT:  left limb ischemia   History of Present Illness:  58 year old female with past medical history of hypothyroidism, peripheral vascular disease, aortobifemoral bypass who presents as transfer from [facility] for left lower limb ischemia. Noted to have LLE numbness and pain for the past few weeks. She had CT angio Ao+Bifem in the ED showing no contrast in the graft concerning for occlusion, no discernible runoff into the LLE. Labs with WBC 11.6, CMP okay with mild elevation in BUN to 23. Dr. Lanis with vascular surgery was consulted and patient transferred to [facility] for intervention. She was taken urgently to surgery in which foul-smelling thrombus was found in the entirety of the left lower extremity. They were not able to re-establish outlfow into the left leg and she was transferred to ICU post-operative intubated, ccm consulted.   Pertinent  Medical History  hypothyroidism, peripheral vascular disease, aortobifemoral bypass  Significant Hospital Events: Including procedures, antibiotic start and stop dates in addition to other pertinent events   7/20: [facility] with left leg pain  11/02/23: vascular surg with Dr. Lanis, unable to get flow into left leg, infected thrombus>ICU post op, poor prognosis, transition to comfort care  Interim History / Subjective:  Pt's LLE unable to be re-vascularized and she has and entero-aortic fistula and is unlikely to survive surgery Family met with palliative care and transitioned to comfort care, supportive care until everyone can visit  Objective   Blood pressure (!) 76/54, pulse 71, temperature 97.6 F (36.4 C), temperature source Axillary, resp. rate (!) 22, height 5' (1.524 m), weight 83 kg, SpO2 100%.    Vent Mode: PRVC FiO2 (%):  [40 %-60 %] 40 % Set Rate:  [18 bmp-22 bmp] 22 bmp Vt Set:   [360 mL-370 mL] 370 mL PEEP:  [5 cmH20] 5 cmH20 Plateau Pressure:  [15 cmH20] 15 cmH20   Intake/Output Summary (Last 24 hours) at 2023/11/02 0734 Last data filed at November 02, 2023 0600 Gross per 24 hour  Intake 8337.05 ml  Output 3525 ml  Net 4812.05 ml   Filed Weights   10/13/23 1214 11-02-2023 0409  Weight: 76.7 kg 83 kg    General:  critically ill-appearing F intubated and sedated HEENT: MM pink/moist, ETT in place Neuro: examined on propofol , RASS -1 CV: s1s2 rrr, no m/r/g PULM:  intubated, clear bilaterally and synchronous with vent  GI: soft, bsx4 active  Extremities: LLE mottled and cold without pulses, RLE cool with minimally palpable PT pulses      Resolved Hospital Problem list     Assessment & Plan:    Left lower limb ischemia s/p unsuccessful revascularization  Infected thrombus of left lower extremity  Right lower extremity thrombus s/p femoral-popliteal embolectomy History of aortobifemoral bypass with concern for aortoenteric fistula  Presents with weeks of LLE pain. Found to be ischemic on CT showing no flow into the left leg. Went emergently with vascular for thrombectomy. Entire leg thrombosed with foul-smelling thrombus. Unable to establish reperfusion of the leg.  - vascular following, appreciate management  -Pt in an unfortunate and unsurvivable condition as pt would likely pass in the OR with attempted amputation and/or explant of graft  - con't supportive care, palliative consult  - pain control with morphine , percocet, tylenol , fentanyl  gtt  - will place on vancomycin , rocephin  for now to cover infected thrombus, narrow with  cultures   Post-operative vent management  -Maintain full vent support with SAT/SBT as tolerated -titrate Vent setting to maintain SpO2 greater than or equal to 90%. -HOB elevated 30 degrees. -Plateau pressures less than 30 cm H20.  -Follow chest x-ray, ABG prn.   -Bronchial hygiene and RT/bronchodilator  protocol.   Hypothyroidism  - home synthroid  88mcg daily   Hyperlipidemia  - f/u lipid panel  - rosuvastatin  10mg  daily   Best Practice (right click and Reselect all SmartList Selections daily)   Diet/type: NPO DVT prophylaxis: systemic heparin  GI prophylaxis: H2B Lines: Central line, Arterial Line, and yes and it is still needed Foley:  Yes, and it is still needed Code Status:  full code Last date of multidisciplinary goals of care discussion [pending]  Labs   CBC: Recent Labs  Lab 10/13/23 1327 10/13/23 2035 10/13/23 2207 10/13/23 2316 11/08/2023 0011 11/08/23 0213 2023/11/08 0235  WBC 11.6*  --   --   --   --   --  38.4*  NEUTROABS 9.9*  --   --   --   --   --   --   HGB 13.8   < > 5.4* 9.9* 10.2* 12.2 12.2  HCT 44.1   < > 16.0* 29.0* 30.0* 36.0 34.9*  MCV 93.0  --   --   --   --   --  88.1  PLT 301  --   --   --   --   --  141*   < > = values in this interval not displayed.    Basic Metabolic Panel: Recent Labs  Lab 10/13/23 1327 10/13/23 2035 10/13/23 2207 10/13/23 2316 Nov 08, 2023 0011 2023-11-08 0213 Nov 08, 2023 0235  NA 138   < > 140 142 140 138 140  K 4.0   < > 3.6 4.5 4.6 4.7 5.1  CL 104  --   --   --   --   --  104  CO2 21*  --   --   --   --   --  20*  GLUCOSE 85  --   --   --   --   --  184*  BUN 23*  --   --   --   --   --  20  CREATININE 0.93  --   --   --   --   --  1.10*  CALCIUM  9.4  --   --   --   --   --  9.2  MG  --   --   --   --   --   --  1.7   < > = values in this interval not displayed.   GFR: Estimated Creatinine Clearance: 54.5 mL/min (A) (by C-G formula based on SCr of 1.1 mg/dL (H)). Recent Labs  Lab 10/13/23 1327 November 08, 2023 0235  WBC 11.6* 38.4*    Liver Function Tests: Recent Labs  Lab 10/13/23 1327  AST 27  ALT 30  ALKPHOS 147*  BILITOT 0.8  PROT 7.7  ALBUMIN  3.3*   No results for input(s): LIPASE, AMYLASE in the last 168 hours. No results for input(s): AMMONIA in the last 168 hours.  ABG    Component  Value Date/Time   PHART 7.231 (L) 2023/11/08 0213   PCO2ART 51.6 (H) 11-08-2023 0213   PO2ART 134 (H) November 08, 2023 0213   HCO3 21.7 Nov 08, 2023 0213   TCO2 23 08-Nov-2023 0213   ACIDBASEDEF 6.0 (H) 11-08-2023 0213   O2SAT 98 11/08/23 0213  Coagulation Profile: No results for input(s): INR, PROTIME in the last 168 hours.  Cardiac Enzymes: No results for input(s): CKTOTAL, CKMB, CKMBINDEX, TROPONINI in the last 168 hours.  HbA1C: Hgb A1c MFr Bld  Date/Time Value Ref Range Status  10-16-23 02:35 AM 5.9 (H) 4.8 - 5.6 % Final    Comment:    (NOTE) Diagnosis of Diabetes The following HbA1c ranges recommended by the American Diabetes Association (ADA) may be used as an aid in the diagnosis of diabetes mellitus.  Hemoglobin             Suggested A1C NGSP%              Diagnosis  <5.7                   Non Diabetic  5.7-6.4                Pre-Diabetic  >6.4                   Diabetic  <7.0                   Glycemic control for                       adults with diabetes.    01/26/2015 07:31 PM 6.2 (H) 4.8 - 5.6 % Final    Comment:    (NOTE)         Pre-diabetes: 5.7 - 6.4         Diabetes: >6.4         Glycemic control for adults with diabetes: <7.0     CBG: Recent Labs  Lab 10/16/2023 0114 16-Oct-2023 0332  GLUCAP 159* 159*    Review of Systems:   As above  Past Medical History:  She,  has a past medical history of Hypothyroidism and Peripheral vascular disease (HCC).   Surgical History:   Past Surgical History:  Procedure Laterality Date   ABDOMINAL AORTOGRAM N/A 05/02/2016   Procedure: Abdominal Aortogram;  Surgeon: Penne Lonni Colorado, MD;  Location: [facility] INVASIVE CV LAB;  Service: Cardiovascular;  Laterality: N/A;   ABDOMINAL AORTOGRAM W/LOWER EXTREMITY N/A 11/28/2017   Procedure: ABDOMINAL AORTOGRAM W/LOWER EXTREMITY;  Surgeon: Gretta Lonni PARAS, MD;  Location: [facility] INVASIVE CV LAB;  Service: Cardiovascular;  Laterality: N/A;  Bilatertal    AORTA - BILATERAL FEMORAL ARTERY BYPASS GRAFT Bilateral 08/25/2018   Procedure: AORTA BIFEMORAL BYPASS USING HEMASHIELD GOLD 14 x GRAFT;  Surgeon: Gretta Lonni PARAS, MD;  Location: [facility] OR;  Service: Vascular;  Laterality: Bilateral;   FEMORAL ARTERY EXPLORATION Bilateral 01/28/2015   Procedure: BILATERAL FEMORAL ARTERY EXPLORATION;  Surgeon: Krystal JULIANNA Doing, MD;  Location: [facility] OR;  Service: Vascular;  Laterality: Bilateral;   INSERTION OF ILIAC STENT Bilateral 01/28/2015   Procedure: INSERTION OF RIGHT AND LEFT ILIAC STENT;  Surgeon: Krystal JULIANNA Doing, MD;  Location: [facility] OR;  Service: Vascular;  Laterality: Bilateral;   LOWER EXTREMITY ANGIOGRAPHY N/A 05/02/2016   Procedure: Bilateral Illac Stent;  Surgeon: Penne Lonni Colorado, MD;  Location: [facility] INVASIVE CV LAB;  Service: Cardiovascular;  Laterality: N/A;   PERIPHERAL VASCULAR CATHETERIZATION N/A 01/26/2015   Procedure: Abdominal Aortogram;  Surgeon: Krystal JULIANNA Doing, MD;  Location: [facility] INVASIVE CV LAB;  Service: Cardiovascular;  Laterality: N/A;   PERIPHERAL VASCULAR CATHETERIZATION N/A 04/19/2016   Procedure: Abdominal Aortogram w/Lower Extremity;  Surgeon: Redell LITTIE Door, MD;  Location: [facility] INVASIVE CV LAB;  Service: Cardiovascular;  Laterality: N/A;   PERIPHERAL VASCULAR INTERVENTION Bilateral 11/28/2017   Procedure: PERIPHERAL VASCULAR INTERVENTION;  Surgeon: Gretta Lonni PARAS, MD;  Location: [facility] INVASIVE CV LAB;  Service: Cardiovascular;  Laterality: Bilateral;  Iliac     Social History:   reports that she quit smoking about 9 years ago. Her smoking use included cigarettes. She has never used smokeless tobacco. She reports that she does not drink alcohol  and does not use drugs.   Family History:  Her family history includes CAD in her father; Diabetes type II in her father.   Allergies Allergies  Allergen Reactions   Amoxicillin Shortness Of Breath, Rash and Other (See Comments)    Panic attacks.      Home Medications  Prior to Admission  medications   Medication Sig Start Date End Date Taking? Authorizing Provider  ACCU-CHEK AVIVA PLUS test strip TEST twice a day Patient not taking: Reported on 08/27/2023 11/08/12   Jayne Vonn DEL, MD  ACCU-CHEK SOFTCLIX LANCETS lancets TEST twice a day Patient not taking: Reported on 08/27/2023 11/08/12   Jayne Vonn DEL, MD  acetaminophen  (TYLENOL ) 500 MG tablet Take 1,000 mg by mouth 2 (two) times daily as needed for moderate pain.    [provider]  aspirin  EC 81 MG EC tablet Take 1 tablet (81 mg total) by mouth daily. 08/30/18   Bethanie Cough, PA-C  glucose monitoring kit (FREESTYLE) monitoring kit 1 each by Does not apply route as needed for other. Use as directed Patient not taking: Reported on 08/27/2023 10/08/12   Jayne Vonn DEL, MD  ibuprofen  (ADVIL ) 800 MG tablet Take 1 tablet (800 mg total) by mouth every 8 (eight) hours as needed. Take with food to prevent GI upset 07/06/22   Chandra Harlene LABOR, NP  levothyroxine  (SYNTHROID , LEVOTHROID) 75 MCG tablet Take 75 mcg by mouth daily before breakfast.    [provider]  rosuvastatin  (CRESTOR ) 10 MG tablet Take 10 mg by mouth at bedtime.     [provider]     Critical care time: 50 minutes    CRITICAL CARE Performed by: Leita SAUNDERS Briannie Gutierrez   Total critical care time: 50 minutes  Critical care time was exclusive of separately billable procedures and treating other patients.  Critical care was necessary to treat or prevent imminent or life-threatening deterioration.  Critical care was time spent personally by me on the following activities: development of treatment plan with patient and/or surrogate as well as nursing, discussions with consultants, evaluation of patient's response to treatment, examination of patient, obtaining history from patient or surrogate, ordering and performing treatments and interventions, ordering and review of laboratory studies, ordering and review of radiographic studies, pulse oximetry  and re-evaluation of patient's condition.   Leita SAUNDERS Aimar Borghi, PA-C [facility] See Amion for pager If no response to pager , please call [phone] until 7pm After 7:00 pm call Elink  [phone]

## 2023-10-25 NOTE — Progress Notes (Signed)
 PT Cancellation Note  Patient Details Name: Brianna Powers MRN: [id number] DOB: [birth year]   Cancelled Treatment:    Reason Eval/Treat Not Completed: Patient not medically ready (pt with reocculsion and await plan prior to therapy. will sign off and await new order)   Thoren Hosang B Mikiya Nebergall 11-02-2023, 7:58 AM Lenoard SQUIBB, PT [facility] Office: [phone]

## 2023-10-25 NOTE — Op Note (Addendum)
 NAME: Brianna Powers    MRN: [id number] DOB: Jan 19, 1966    DATE OF OPERATION: November 05, 2023  PREOP DIAGNOSIS:    Left lower extremity acute limb ischemia with thrombus in bilateral limbs of the aortobifemoral bypass graft  POSTOP DIAGNOSIS:    Same  PROCEDURE:    Ultrasound-guided micropuncture access of the the right brachial artery for arterial line access, Arrow catheter placement for A-line Bilateral common femoral artery reexposure greater than 30 days Bilateral iliofemoral, Profunda, superficial femoral, popliteal artery embolectomy Bilateral common femoral artery Rifampin  soaked dacryon patch angioplasty Left sided dorsalis pedis artery exposure Anterior tibial artery, dorsalis pedis artery embolectomy Left sided posterior tibial artery exposure Posterior tibial artery, plantar artery embolectomy 4 compartment fasciotomy of the left lower extremity Bilateral groin vacuum dressings  SURGEON: Fonda FORBES Rim  ASSIST: Curry Damme, PA  ANESTHESIA: General anesthesia  EBL: 1500 mL  INDICATIONS:    Brianna Powers is a 58 y.o. female with prior history of aortobifemoral bypass who presents with dense, to be limb ischemia of the left lower extremity and imaging demonstrate thrombus extending in the body of the aortobifemoral bypass in both limbs.  There is no opacification of the left lower extremity other than a small profunda.  There is thrombus in the right common femoral artery as well. CT also demonstrates air surrounding the aortobifemoral bypass concerning for infection  FINDINGS:   On the right: Iliofemoral, femoral-popliteal embolectomy yielded chronic thrombus which appeared sandy in texture On the left: Iliofemoral, femoral-popliteal embolectomy yielded foul-smelling thrombus that was oily.  The entirety of the left lower extremity was thrombosed necessitating pedal cutdowns in an effort to reestablish perfusion.  Outflow was occluded. 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  I elected not to explant the graft this evening as this was not part of her consent, and I thought that this would further increase ischemic time to the left lower extremity.   At the end of the case, the left lower extremity had bleeding in the calf, however it was rigored at the knee.  There was no outflow into the foot. The patient will require left lower extremity amputation, likely above-knee.  Patient will also need graft explant should family elect to proceed.  She was taken to the ICU in critical condition, intubated   TECHNIQUE:   Patient was brought to the OR laid in supine position.  General anesthesia induced and the patient's prepped and draped in standard fashion.  There was difficulty securing an arterial line, therefore an ultrasound was brought into the field.  An ultrasound-guided micropuncture needle was used to access the right brachial artery in retrograde fashion.  An arterial catheter was placed over the wire and Seldinger fashion for arterial line.  The case began with ultrasound insonation of bilateral common femoral arteries.  These were marked and oblique incisions made.  I began in the left groin, and carried it down to the common femoral artery.  This took a considerable amount of time being that it was a redo, redo groin.  I exposed the common femoral artery, superficial femoral artery, profunda, and the aortobifemoral bypass graft limb.  I then moved to the right side, and performed the same exposure, which again proved challenging due to the redo nature of the case.  Again, this took a significant amount of time.    Once exposed, the patient was heparinized.  I began on the right, the artery  was clamped, and a longitudinal arteriotomy made has there was clot in the common femoral artery, and I thought that it would be easier to thrombectomize through a longitudinal incision.  Clot in the common  femoral artery was removed and a #6 Fogarty embolectomy catheter was passed proximally and pulled down.  This yielded a significant amount of clot.  This appeared chronic, and had the texture of sand.  This was sent to pathology.  I also performed the embolectomy down the profunda, and superficial femoral artery/popliteal artery.  Once completed, I clamped the artery and moved to the patient's left side.  Upon opening the left groin through a longitudinal arteriotomy, which I thought was necessary as the profunda was very far from the graft hood, a very foul smell was encountered.  The thrombus looked completely different than the right side.  It appeared oily.  I was very concerned that this was infected, therefore samples were taken and sent to microbiology.  Next, iliofemoral embolectomy followed.  I had trouble passing proximally, I am not sure if there was a kink, but I was able to get the #6 to pass several times and pull a significant amount of thrombus down.  After several pools, there was pulsatile inflow.  The limb was clamped, then I moved distally.  There were 2 profunda branches, both of these were thrombectomized, and then I worked on the superficial femoral artery.  In total, there was a massive amount of clot pulled with embolectomy.  Numerous passes were made.    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  The patient was not consented for graft explant nor did I think this was a good idea as my main goal for this operation was to reestablish flow in the lower extremity.  I elected to soak 2 dacryon patches and rifampin , and sewed them in to the bilateral common femoral arteries at the site of the previous arteriotomies.  Upon release of clamps, there was flow in the right leg, the left leg there was no signal at the level of the foot.  There was a signal distally in the calf.  I was unsure if this was due to spasm or  pressor requirement, therefore I moved forward with 4 compartment fasciotomy of the left calf in standard fashion.  Incisions were 20 cm and Metzenbaum scissors were used to extend these proximally and distally at the level of the fascia.  I insonated the anterior tibial artery which was multiphasic, the posterior tibial artery was not appreciated.  In the foot, there remained no signal, and on ultrasound there appeared to be thrombus, which was not surprising.    I elected to move to posterior tibial artery cutdown at the level of the malleolus.  The posterior tibial artery was exposed, a transverse arteriotomy made, and a #2 Fogarty embolectomy catheter passed proximally and distally.  This yielded significant amount of thrombus.  I was unable to pass the catheter very far in the foot.  The arteriotomy was closed using interrupted 7-0 suture.  Upon completion, there was minimal flow distally, product of poor outflow.  I then moved to the dorsalis pedis artery and in standard fashion, again made a longitudinal incision on the foot followed by exposure of the dorsalis pedis artery, transverse arteriotomy and antegrade and retrograde embolectomy of the anterior tibial artery and dorsalis pedis artery respectively.  Again, this initially yielded pulsatile inflow, however once the  artery was repaired using interrupted 7-0 Prolene suture, there was very poor signal distally.  I ensured that the arteriotomy was not closed.  At this point, there were no other arteries to embolectomies.  The foot had poor outflow.  The knee was rigored, but muscle in the calf was bleeding.   I elected to irrigate the incisions, and closed the dorsalis pedis and posterior tibial artery incisions using 3-0 Vicryl suture with staples at the level of the skin.  The fasciotomies were left open, and placed under compression using Roman sandal technique.  Bilateral groins were left open with white sponge followed by black sponge and  vacuum-assisted dressings.  Patient was taken to the ICU in critical condition.  She will require explant and likely left lower extremity above-knee amputation should her family choose to proceed with all options.  Fonda FORBES Rim, MD [facility] DATE OF DICTATION:   2023/10/25

## 2023-10-25 DEATH — deceased

## 2023-11-26 ENCOUNTER — Ambulatory Visit: Admitting: Vascular Surgery
